# Patient Record
Sex: Female | Born: 1955 | Race: White | Hispanic: No | Marital: Married | Smoking: Current every day smoker
Health system: Southern US, Community
[De-identification: ages and names within clinical notes are randomized; demographics above are authoritative.]

## PROBLEM LIST (undated history)

## (undated) DIAGNOSIS — Z9889 Other specified postprocedural states: Secondary | ICD-10-CM

## (undated) DIAGNOSIS — I10 Essential (primary) hypertension: Secondary | ICD-10-CM

## (undated) DIAGNOSIS — E039 Hypothyroidism, unspecified: Secondary | ICD-10-CM

## (undated) DIAGNOSIS — M545 Low back pain, unspecified: Secondary | ICD-10-CM

## (undated) DIAGNOSIS — F32A Depression, unspecified: Secondary | ICD-10-CM

## (undated) DIAGNOSIS — E041 Nontoxic single thyroid nodule: Secondary | ICD-10-CM

## (undated) DIAGNOSIS — Z720 Tobacco use: Secondary | ICD-10-CM

## (undated) DIAGNOSIS — G8929 Other chronic pain: Secondary | ICD-10-CM

## (undated) DIAGNOSIS — G709 Myoneural disorder, unspecified: Secondary | ICD-10-CM

## (undated) DIAGNOSIS — F40240 Claustrophobia: Secondary | ICD-10-CM

## (undated) DIAGNOSIS — E119 Type 2 diabetes mellitus without complications: Secondary | ICD-10-CM

## (undated) DIAGNOSIS — E785 Hyperlipidemia, unspecified: Secondary | ICD-10-CM

## (undated) DIAGNOSIS — M797 Fibromyalgia: Secondary | ICD-10-CM

## (undated) DIAGNOSIS — F329 Major depressive disorder, single episode, unspecified: Secondary | ICD-10-CM

## (undated) DIAGNOSIS — M1990 Unspecified osteoarthritis, unspecified site: Secondary | ICD-10-CM

## (undated) DIAGNOSIS — R112 Nausea with vomiting, unspecified: Secondary | ICD-10-CM

## (undated) DIAGNOSIS — J449 Chronic obstructive pulmonary disease, unspecified: Secondary | ICD-10-CM

## (undated) DIAGNOSIS — R51 Headache: Secondary | ICD-10-CM

## (undated) DIAGNOSIS — I2510 Atherosclerotic heart disease of native coronary artery without angina pectoris: Secondary | ICD-10-CM

## (undated) DIAGNOSIS — F419 Anxiety disorder, unspecified: Secondary | ICD-10-CM

## (undated) DIAGNOSIS — I219 Acute myocardial infarction, unspecified: Secondary | ICD-10-CM

## (undated) DIAGNOSIS — R011 Cardiac murmur, unspecified: Secondary | ICD-10-CM

## (undated) DIAGNOSIS — R809 Proteinuria, unspecified: Secondary | ICD-10-CM

## (undated) DIAGNOSIS — K219 Gastro-esophageal reflux disease without esophagitis: Secondary | ICD-10-CM

## (undated) DIAGNOSIS — G4730 Sleep apnea, unspecified: Secondary | ICD-10-CM

## (undated) DIAGNOSIS — M4712 Other spondylosis with myelopathy, cervical region: Principal | ICD-10-CM

## (undated) HISTORY — PX: TONSILLECTOMY: SUR1361

## (undated) HISTORY — PX: COLONOSCOPY W/ POLYPECTOMY: SHX1380

## (undated) HISTORY — PX: CARDIAC CATHETERIZATION: SHX172

## (undated) HISTORY — PX: BACK SURGERY: SHX140

---
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
## 1979-06-19 HISTORY — PX: TUBAL LIGATION: SHX77

## 1998-06-18 HISTORY — PX: MULTIPLE TOOTH EXTRACTIONS: SHX2053

## 1998-06-18 HISTORY — PX: ABDOMINAL HYSTERECTOMY: SHX81

## 1998-12-21 ENCOUNTER — Ambulatory Visit (HOSPITAL_COMMUNITY): Admission: RE | Admit: 1998-12-21 | Discharge: 1998-12-21 | Payer: Self-pay | Admitting: *Deleted

## 2000-06-18 HISTORY — PX: CORONARY ANGIOPLASTY WITH STENT PLACEMENT: SHX49

## 2000-07-19 ENCOUNTER — Encounter: Admission: RE | Admit: 2000-07-19 | Discharge: 2000-07-19 | Payer: Self-pay | Admitting: Unknown Physician Specialty

## 2000-07-19 ENCOUNTER — Encounter: Payer: Self-pay | Admitting: Unknown Physician Specialty

## 2001-01-17 ENCOUNTER — Ambulatory Visit (HOSPITAL_COMMUNITY): Admission: RE | Admit: 2001-01-17 | Discharge: 2001-01-17 | Payer: Self-pay | Admitting: Cardiology

## 2001-03-20 ENCOUNTER — Ambulatory Visit (HOSPITAL_COMMUNITY): Admission: RE | Admit: 2001-03-20 | Discharge: 2001-03-21 | Payer: Self-pay | Admitting: Cardiology

## 2001-04-06 ENCOUNTER — Emergency Department (HOSPITAL_COMMUNITY): Admission: EM | Admit: 2001-04-06 | Discharge: 2001-04-06 | Payer: Self-pay

## 2001-06-02 ENCOUNTER — Ambulatory Visit (HOSPITAL_COMMUNITY): Admission: RE | Admit: 2001-06-02 | Discharge: 2001-06-02 | Payer: Self-pay | Admitting: Cardiology

## 2001-09-19 ENCOUNTER — Encounter: Admission: RE | Admit: 2001-09-19 | Discharge: 2001-09-19 | Payer: Self-pay | Admitting: Internal Medicine

## 2001-09-19 ENCOUNTER — Encounter: Payer: Self-pay | Admitting: Internal Medicine

## 2001-11-20 ENCOUNTER — Encounter: Admission: RE | Admit: 2001-11-20 | Discharge: 2001-11-20 | Payer: Self-pay | Admitting: Internal Medicine

## 2001-11-20 ENCOUNTER — Encounter: Payer: Self-pay | Admitting: Internal Medicine

## 2002-04-03 ENCOUNTER — Encounter: Payer: Self-pay | Admitting: Gastroenterology

## 2002-04-03 ENCOUNTER — Ambulatory Visit (HOSPITAL_COMMUNITY): Admission: RE | Admit: 2002-04-03 | Discharge: 2002-04-03 | Payer: Self-pay | Admitting: Gastroenterology

## 2002-04-06 ENCOUNTER — Ambulatory Visit (HOSPITAL_COMMUNITY): Admission: RE | Admit: 2002-04-06 | Discharge: 2002-04-06 | Payer: Self-pay | Admitting: Gastroenterology

## 2002-04-06 ENCOUNTER — Encounter (INDEPENDENT_AMBULATORY_CARE_PROVIDER_SITE_OTHER): Payer: Self-pay

## 2002-04-06 ENCOUNTER — Encounter: Payer: Self-pay | Admitting: Gastroenterology

## 2002-06-15 ENCOUNTER — Ambulatory Visit (HOSPITAL_COMMUNITY): Admission: RE | Admit: 2002-06-15 | Discharge: 2002-06-15 | Payer: Self-pay | Admitting: Gastroenterology

## 2003-08-24 ENCOUNTER — Inpatient Hospital Stay (HOSPITAL_BASED_OUTPATIENT_CLINIC_OR_DEPARTMENT_OTHER): Admission: RE | Admit: 2003-08-24 | Discharge: 2003-08-24 | Payer: Self-pay | Admitting: Cardiology

## 2003-10-12 ENCOUNTER — Emergency Department (HOSPITAL_COMMUNITY): Admission: EM | Admit: 2003-10-12 | Discharge: 2003-10-12 | Payer: Self-pay | Admitting: *Deleted

## 2003-12-31 ENCOUNTER — Encounter: Admission: RE | Admit: 2003-12-31 | Discharge: 2003-12-31 | Payer: Self-pay | Admitting: Internal Medicine

## 2004-05-01 ENCOUNTER — Ambulatory Visit: Payer: Self-pay | Admitting: Cardiology

## 2004-05-05 ENCOUNTER — Ambulatory Visit: Payer: Self-pay | Admitting: Cardiology

## 2004-07-28 ENCOUNTER — Encounter: Admission: RE | Admit: 2004-07-28 | Discharge: 2004-07-28 | Payer: Self-pay | Admitting: Internal Medicine

## 2004-08-25 ENCOUNTER — Ambulatory Visit: Payer: Self-pay | Admitting: Gastroenterology

## 2004-08-31 ENCOUNTER — Ambulatory Visit: Payer: Self-pay | Admitting: Endocrinology

## 2004-09-01 ENCOUNTER — Ambulatory Visit (HOSPITAL_COMMUNITY): Admission: RE | Admit: 2004-09-01 | Discharge: 2004-09-01 | Payer: Self-pay | Admitting: Gastroenterology

## 2004-09-01 ENCOUNTER — Ambulatory Visit: Payer: Self-pay | Admitting: Gastroenterology

## 2004-09-05 ENCOUNTER — Ambulatory Visit: Payer: Self-pay | Admitting: Cardiology

## 2004-09-07 ENCOUNTER — Ambulatory Visit: Payer: Self-pay | Admitting: Endocrinology

## 2004-09-15 ENCOUNTER — Ambulatory Visit: Payer: Self-pay | Admitting: Family Medicine

## 2004-09-21 ENCOUNTER — Ambulatory Visit: Payer: Self-pay | Admitting: Endocrinology

## 2004-09-22 ENCOUNTER — Ambulatory Visit: Payer: Self-pay

## 2004-09-22 ENCOUNTER — Ambulatory Visit: Payer: Self-pay | Admitting: Cardiology

## 2004-09-27 ENCOUNTER — Ambulatory Visit: Payer: Self-pay | Admitting: Family Medicine

## 2004-10-11 ENCOUNTER — Ambulatory Visit: Payer: Self-pay | Admitting: Family Medicine

## 2004-10-19 ENCOUNTER — Ambulatory Visit: Payer: Self-pay | Admitting: Endocrinology

## 2004-10-20 ENCOUNTER — Ambulatory Visit: Payer: Self-pay | Admitting: Family Medicine

## 2004-11-02 ENCOUNTER — Ambulatory Visit: Payer: Self-pay | Admitting: Endocrinology

## 2004-11-08 ENCOUNTER — Ambulatory Visit: Payer: Self-pay | Admitting: Gastroenterology

## 2004-11-16 ENCOUNTER — Ambulatory Visit: Payer: Self-pay | Admitting: Gastroenterology

## 2004-11-21 ENCOUNTER — Ambulatory Visit (HOSPITAL_COMMUNITY): Admission: RE | Admit: 2004-11-21 | Discharge: 2004-11-21 | Payer: Self-pay | Admitting: Gastroenterology

## 2004-11-21 ENCOUNTER — Ambulatory Visit: Payer: Self-pay | Admitting: Gastroenterology

## 2004-11-21 ENCOUNTER — Encounter (INDEPENDENT_AMBULATORY_CARE_PROVIDER_SITE_OTHER): Payer: Self-pay | Admitting: Specialist

## 2004-11-30 ENCOUNTER — Ambulatory Visit: Payer: Self-pay | Admitting: Family Medicine

## 2004-12-07 ENCOUNTER — Ambulatory Visit: Payer: Self-pay | Admitting: Endocrinology

## 2005-01-05 ENCOUNTER — Ambulatory Visit: Payer: Self-pay | Admitting: Endocrinology

## 2005-01-26 ENCOUNTER — Ambulatory Visit: Payer: Self-pay | Admitting: Family Medicine

## 2005-02-02 ENCOUNTER — Ambulatory Visit: Payer: Self-pay | Admitting: Endocrinology

## 2005-03-01 ENCOUNTER — Ambulatory Visit: Payer: Self-pay | Admitting: Endocrinology

## 2005-03-12 ENCOUNTER — Other Ambulatory Visit: Admission: RE | Admit: 2005-03-12 | Discharge: 2005-03-12 | Payer: Self-pay | Admitting: Obstetrics and Gynecology

## 2005-04-03 ENCOUNTER — Ambulatory Visit: Payer: Self-pay | Admitting: Family Medicine

## 2005-04-06 ENCOUNTER — Ambulatory Visit: Payer: Self-pay | Admitting: Cardiology

## 2005-05-04 ENCOUNTER — Ambulatory Visit: Payer: Self-pay | Admitting: Family Medicine

## 2005-05-24 ENCOUNTER — Ambulatory Visit: Payer: Self-pay | Admitting: Gastroenterology

## 2005-06-01 ENCOUNTER — Ambulatory Visit: Payer: Self-pay | Admitting: Family Medicine

## 2005-06-18 HISTORY — PX: BOWEL RESECTION: SHX1257

## 2005-06-25 ENCOUNTER — Ambulatory Visit: Payer: Self-pay | Admitting: Gastroenterology

## 2005-07-06 ENCOUNTER — Encounter: Admission: RE | Admit: 2005-07-06 | Discharge: 2005-07-06 | Payer: Self-pay | Admitting: Orthopedic Surgery

## 2005-08-21 ENCOUNTER — Ambulatory Visit: Payer: Self-pay | Admitting: Family Medicine

## 2005-09-07 ENCOUNTER — Ambulatory Visit (HOSPITAL_COMMUNITY): Admission: RE | Admit: 2005-09-07 | Discharge: 2005-09-07 | Payer: Self-pay | Admitting: Orthopedic Surgery

## 2005-09-19 ENCOUNTER — Encounter: Admission: RE | Admit: 2005-09-19 | Discharge: 2005-09-19 | Payer: Self-pay | Admitting: Orthopedic Surgery

## 2005-11-01 ENCOUNTER — Ambulatory Visit: Payer: Self-pay | Admitting: Family Medicine

## 2005-11-27 ENCOUNTER — Ambulatory Visit: Payer: Self-pay | Admitting: Family Medicine

## 2005-11-30 ENCOUNTER — Ambulatory Visit: Payer: Self-pay | Admitting: Gastroenterology

## 2005-12-11 ENCOUNTER — Ambulatory Visit: Payer: Self-pay | Admitting: Family Medicine

## 2005-12-24 ENCOUNTER — Encounter: Payer: Self-pay | Admitting: Gastroenterology

## 2005-12-24 ENCOUNTER — Ambulatory Visit (HOSPITAL_COMMUNITY): Admission: RE | Admit: 2005-12-24 | Discharge: 2005-12-24 | Payer: Self-pay | Admitting: Gastroenterology

## 2005-12-28 ENCOUNTER — Ambulatory Visit: Payer: Self-pay | Admitting: Gastroenterology

## 2005-12-31 ENCOUNTER — Ambulatory Visit: Payer: Self-pay | Admitting: Cardiology

## 2006-01-02 ENCOUNTER — Emergency Department (HOSPITAL_COMMUNITY): Admission: EM | Admit: 2006-01-02 | Discharge: 2006-01-02 | Payer: Self-pay | Admitting: Emergency Medicine

## 2006-01-02 ENCOUNTER — Ambulatory Visit: Payer: Self-pay | Admitting: Gastroenterology

## 2006-01-16 ENCOUNTER — Ambulatory Visit: Payer: Self-pay | Admitting: Family Medicine

## 2006-01-24 ENCOUNTER — Encounter (INDEPENDENT_AMBULATORY_CARE_PROVIDER_SITE_OTHER): Payer: Self-pay | Admitting: *Deleted

## 2006-01-24 ENCOUNTER — Ambulatory Visit (HOSPITAL_COMMUNITY): Admission: RE | Admit: 2006-01-24 | Discharge: 2006-01-25 | Payer: Self-pay | Admitting: Surgery

## 2006-04-08 ENCOUNTER — Ambulatory Visit: Payer: Self-pay | Admitting: Family Medicine

## 2006-04-08 LAB — CONVERTED CEMR LAB
ALT: 65 units/L — ABNORMAL HIGH (ref 0–40)
AST: 40 units/L — ABNORMAL HIGH (ref 0–37)
Albumin: 4.2 g/dL (ref 3.5–5.2)
Alkaline Phosphatase: 71 units/L (ref 39–117)
BUN: 10 mg/dL (ref 6–23)
CO2: 30 meq/L (ref 19–32)
Calcium: 9.8 mg/dL (ref 8.4–10.5)
Chloride: 103 meq/L (ref 96–112)
Chol/HDL Ratio, serum: 9.5
Cholesterol: 316 mg/dL (ref 0–200)
Creatinine, Ser: 0.6 mg/dL (ref 0.4–1.2)
Free T4: 0.7 ng/dL — ABNORMAL LOW (ref 0.9–1.8)
GFR calc non Af Amer: 113 mL/min
Glomerular Filtration Rate, Af Am: 137 mL/min/{1.73_m2}
Glucose, Bld: 202 mg/dL — ABNORMAL HIGH (ref 70–99)
HCT: 45.1 % (ref 36.0–46.0)
HDL: 33.3 mg/dL — ABNORMAL LOW (ref >39.0)
Hemoglobin: 15.4 g/dL — ABNORMAL HIGH (ref 12.0–15.0)
Hgb A1c MFr Bld: 6.9 % — ABNORMAL HIGH (ref 4.6–6.0)
LDL DIRECT: 227.9 mg/dL
MCHC: 34.1 g/dL (ref 30.0–36.0)
MCV: 99.5 fL (ref 78.0–100.0)
Magnesium: 1.8 mg/dL (ref 1.5–2.5)
Platelets: 181 10*3/uL (ref 150–400)
Potassium: 4.1 meq/L (ref 3.5–5.1)
RBC: 4.54 M/uL (ref 3.87–5.11)
RDW: 11.7 % (ref 11.5–14.6)
Sodium: 142 meq/L (ref 135–145)
T3, Free: 3.2 pg/mL (ref 2.3–4.2)
TSH: 3.66 microintl units/mL (ref 0.35–5.50)
Total Bilirubin: 0.7 mg/dL (ref 0.3–1.2)
Total Protein: 7.4 g/dL (ref 6.0–8.3)
Triglyceride fasting, serum: 242 mg/dL (ref 0–149)
VLDL: 48 mg/dL — ABNORMAL HIGH (ref 0–40)
Vitamin B-12: 601 pg/mL (ref 211–911)
WBC: 7.6 10*3/uL (ref 4.5–10.5)

## 2006-05-03 ENCOUNTER — Ambulatory Visit: Payer: Self-pay | Admitting: Family Medicine

## 2006-06-28 ENCOUNTER — Ambulatory Visit: Payer: Self-pay | Admitting: Family Medicine

## 2006-07-02 ENCOUNTER — Ambulatory Visit: Payer: Self-pay | Admitting: Family Medicine

## 2006-07-11 ENCOUNTER — Ambulatory Visit: Payer: Self-pay | Admitting: Internal Medicine

## 2006-08-03 ENCOUNTER — Encounter: Payer: Self-pay | Admitting: Family Medicine

## 2006-09-27 ENCOUNTER — Ambulatory Visit: Payer: Self-pay | Admitting: Family Medicine

## 2006-09-27 LAB — CONVERTED CEMR LAB
ALT: 83 units/L — ABNORMAL HIGH (ref 0–40)
AST: 52 units/L — ABNORMAL HIGH (ref 0–37)
Albumin: 3.9 g/dL (ref 3.5–5.2)
Alkaline Phosphatase: 90 units/L (ref 39–117)
BUN: 14 mg/dL (ref 6–23)
Bilirubin, Direct: 0.2 mg/dL (ref 0.0–0.3)
CO2: 29 meq/L (ref 19–32)
Calcium: 9.2 mg/dL (ref 8.4–10.5)
Chloride: 101 meq/L (ref 96–112)
Cholesterol: 287 mg/dL (ref 0–200)
Creatinine, Ser: 0.6 mg/dL (ref 0.4–1.2)
Direct LDL: 221.2 mg/dL
GFR calc Af Amer: 137 mL/min
GFR calc non Af Amer: 113 mL/min
Glucose, Bld: 257 mg/dL — ABNORMAL HIGH (ref 70–99)
HDL: 38.6 mg/dL — ABNORMAL LOW (ref >39.0)
Hgb A1c MFr Bld: 9.4 % — ABNORMAL HIGH (ref 4.6–6.0)
Potassium: 4.2 meq/L (ref 3.5–5.1)
Sodium: 139 meq/L (ref 135–145)
Total Bilirubin: 1.1 mg/dL (ref 0.3–1.2)
Total CHOL/HDL Ratio: 7.4
Total Protein: 6.6 g/dL (ref 6.0–8.3)
Triglycerides: 234 mg/dL (ref 0–149)
VLDL: 47 mg/dL — ABNORMAL HIGH (ref 0–40)

## 2006-10-25 ENCOUNTER — Encounter: Payer: Self-pay | Admitting: Family Medicine

## 2006-11-15 ENCOUNTER — Ambulatory Visit: Payer: Self-pay | Admitting: Family Medicine

## 2006-11-21 ENCOUNTER — Ambulatory Visit: Payer: Self-pay | Admitting: Family Medicine

## 2006-12-09 DIAGNOSIS — E119 Type 2 diabetes mellitus without complications: Secondary | ICD-10-CM | POA: Insufficient documentation

## 2006-12-09 DIAGNOSIS — Z8719 Personal history of other diseases of the digestive system: Secondary | ICD-10-CM | POA: Insufficient documentation

## 2006-12-09 DIAGNOSIS — I2510 Atherosclerotic heart disease of native coronary artery without angina pectoris: Secondary | ICD-10-CM | POA: Insufficient documentation

## 2006-12-09 DIAGNOSIS — J309 Allergic rhinitis, unspecified: Secondary | ICD-10-CM | POA: Insufficient documentation

## 2006-12-09 DIAGNOSIS — K219 Gastro-esophageal reflux disease without esophagitis: Secondary | ICD-10-CM | POA: Insufficient documentation

## 2006-12-09 DIAGNOSIS — E782 Mixed hyperlipidemia: Secondary | ICD-10-CM | POA: Insufficient documentation

## 2006-12-09 DIAGNOSIS — I10 Essential (primary) hypertension: Secondary | ICD-10-CM | POA: Insufficient documentation

## 2006-12-09 DIAGNOSIS — J45909 Unspecified asthma, uncomplicated: Secondary | ICD-10-CM | POA: Insufficient documentation

## 2007-01-24 ENCOUNTER — Ambulatory Visit: Payer: Self-pay | Admitting: Family Medicine

## 2007-01-24 DIAGNOSIS — G9009 Other idiopathic peripheral autonomic neuropathy: Secondary | ICD-10-CM | POA: Insufficient documentation

## 2007-01-30 LAB — CONVERTED CEMR LAB
ALT: 76 units/L — ABNORMAL HIGH (ref 0–35)
AST: 40 units/L — ABNORMAL HIGH (ref 0–37)
Albumin: 4.5 g/dL (ref 3.5–5.2)
Alkaline Phosphatase: 83 units/L (ref 39–117)
BUN: 12 mg/dL (ref 6–23)
Bilirubin, Direct: 0.1 mg/dL (ref 0.0–0.3)
CO2: 25 meq/L (ref 19–32)
Calcium: 9.1 mg/dL (ref 8.4–10.5)
Chloride: 102 meq/L (ref 96–112)
Creatinine, Ser: 0.66 mg/dL (ref 0.40–1.20)
Glucose, Bld: 202 mg/dL — ABNORMAL HIGH (ref 70–99)
HCT: 42.5 % (ref 36.0–46.0)
Hemoglobin: 15.1 g/dL — ABNORMAL HIGH (ref 12.0–15.0)
Indirect Bilirubin: 0.6 mg/dL (ref 0.0–0.9)
Lead-Whole Blood: 0.6 ug/dL (ref <10.0)
MCHC: 35.5 g/dL (ref 30.0–36.0)
MCV: 96.8 fL (ref 78.0–100.0)
Magnesium: 1.6 mg/dL (ref 1.5–2.5)
Platelets: 169 10*3/uL (ref 150–400)
Potassium: 3.9 meq/L (ref 3.5–5.3)
Pro B Natriuretic peptide (BNP): 165 pg/mL — ABNORMAL HIGH (ref 0.0–100.0)
RBC: 4.39 M/uL (ref 3.87–5.11)
RDW: 12.9 % (ref 11.5–14.0)
Sed Rate: 15 mm/hr (ref 0–22)
Sodium: 140 meq/L (ref 135–145)
TSH: 2.818 microintl units/mL (ref 0.350–5.50)
Total Bilirubin: 0.7 mg/dL (ref 0.3–1.2)
Total Protein: 7.1 g/dL (ref 6.0–8.3)
Vitamin B-12: 712 pg/mL (ref 211–911)
WBC: 8.5 10*3/uL (ref 4.0–10.5)

## 2007-01-31 ENCOUNTER — Encounter: Payer: Self-pay | Admitting: Family Medicine

## 2007-02-10 ENCOUNTER — Telehealth: Payer: Self-pay | Admitting: Family Medicine

## 2007-02-14 ENCOUNTER — Ambulatory Visit: Payer: Self-pay | Admitting: Family Medicine

## 2007-02-14 DIAGNOSIS — K5909 Other constipation: Secondary | ICD-10-CM | POA: Insufficient documentation

## 2007-02-14 DIAGNOSIS — IMO0001 Reserved for inherently not codable concepts without codable children: Secondary | ICD-10-CM | POA: Insufficient documentation

## 2007-02-18 ENCOUNTER — Telehealth: Payer: Self-pay | Admitting: Family Medicine

## 2007-02-19 DIAGNOSIS — M2550 Pain in unspecified joint: Secondary | ICD-10-CM | POA: Insufficient documentation

## 2007-02-28 ENCOUNTER — Telehealth: Payer: Self-pay | Admitting: *Deleted

## 2007-04-10 DIAGNOSIS — M549 Dorsalgia, unspecified: Secondary | ICD-10-CM | POA: Insufficient documentation

## 2007-04-18 ENCOUNTER — Encounter: Payer: Self-pay | Admitting: Family Medicine

## 2007-04-22 ENCOUNTER — Ambulatory Visit: Payer: Self-pay | Admitting: Family Medicine

## 2007-04-22 DIAGNOSIS — F411 Generalized anxiety disorder: Secondary | ICD-10-CM | POA: Insufficient documentation

## 2007-04-22 DIAGNOSIS — J209 Acute bronchitis, unspecified: Secondary | ICD-10-CM | POA: Insufficient documentation

## 2007-05-01 ENCOUNTER — Ambulatory Visit: Payer: Self-pay | Admitting: Family Medicine

## 2007-05-05 ENCOUNTER — Encounter: Payer: Self-pay | Admitting: Family Medicine

## 2007-05-06 ENCOUNTER — Encounter: Payer: Self-pay | Admitting: Family Medicine

## 2007-05-07 ENCOUNTER — Encounter: Payer: Self-pay | Admitting: Family Medicine

## 2007-05-30 ENCOUNTER — Encounter: Payer: Self-pay | Admitting: Family Medicine

## 2007-06-27 DIAGNOSIS — G5600 Carpal tunnel syndrome, unspecified upper limb: Secondary | ICD-10-CM | POA: Insufficient documentation

## 2007-06-27 DIAGNOSIS — M791 Myalgia, unspecified site: Secondary | ICD-10-CM | POA: Insufficient documentation

## 2007-08-04 ENCOUNTER — Encounter: Payer: Self-pay | Admitting: Family Medicine

## 2007-08-05 DIAGNOSIS — K769 Liver disease, unspecified: Secondary | ICD-10-CM | POA: Insufficient documentation

## 2007-08-05 DIAGNOSIS — K649 Unspecified hemorrhoids: Secondary | ICD-10-CM | POA: Insufficient documentation

## 2007-10-27 ENCOUNTER — Telehealth: Payer: Self-pay | Admitting: Family Medicine

## 2007-12-26 DIAGNOSIS — R52 Pain, unspecified: Secondary | ICD-10-CM | POA: Insufficient documentation

## 2008-04-15 DIAGNOSIS — K5730 Diverticulosis of large intestine without perforation or abscess without bleeding: Secondary | ICD-10-CM | POA: Insufficient documentation

## 2008-04-15 DIAGNOSIS — M5137 Other intervertebral disc degeneration, lumbosacral region: Secondary | ICD-10-CM | POA: Insufficient documentation

## 2008-04-15 DIAGNOSIS — M51379 Other intervertebral disc degeneration, lumbosacral region without mention of lumbar back pain or lower extremity pain: Secondary | ICD-10-CM | POA: Insufficient documentation

## 2008-10-07 DIAGNOSIS — K2270 Barrett's esophagus without dysplasia: Secondary | ICD-10-CM | POA: Insufficient documentation

## 2008-11-30 DIAGNOSIS — I8390 Asymptomatic varicose veins of unspecified lower extremity: Secondary | ICD-10-CM | POA: Insufficient documentation

## 2008-12-21 DIAGNOSIS — I2510 Atherosclerotic heart disease of native coronary artery without angina pectoris: Secondary | ICD-10-CM | POA: Insufficient documentation

## 2009-03-04 ENCOUNTER — Encounter: Payer: Self-pay | Admitting: Family Medicine

## 2009-04-22 DIAGNOSIS — E559 Vitamin D deficiency, unspecified: Secondary | ICD-10-CM | POA: Insufficient documentation

## 2010-05-31 DIAGNOSIS — E042 Nontoxic multinodular goiter: Secondary | ICD-10-CM | POA: Insufficient documentation

## 2010-07-09 ENCOUNTER — Encounter: Payer: Self-pay | Admitting: Orthopedic Surgery

## 2010-07-16 LAB — CONVERTED CEMR LAB
BUN: 9 mg/dL (ref 6–23)
CO2: 30 meq/L (ref 19–32)
Calcium: 9.4 mg/dL (ref 8.4–10.5)
Chloride: 101 meq/L (ref 96–112)
Creatinine, Ser: 0.7 mg/dL (ref 0.4–1.2)
GFR calc Af Amer: 114 mL/min
GFR calc non Af Amer: 95 mL/min
Glucose, Bld: 309 mg/dL — ABNORMAL HIGH (ref 70–99)
Potassium: 4.6 meq/L (ref 3.5–5.1)
Sodium: 139 meq/L (ref 135–145)
Total CK: 156 units/L (ref 7–177)

## 2010-07-20 NOTE — Progress Notes (Signed)
Summary: refill valium  Phone Note From Pharmacy Call back at [phone]   Caller: [facility] [place] Call For: Johnnie Goynes  Summary of Call: refill valium 10mg  1 by mouth two times a day as needed Initial call taken by: Alfred Levins, CMA,  Oct 27, 2007 8:01 AM  Follow-up for Phone Call        call in #60 with 5 rf Follow-up by: Nelwyn Salisbury MD,  Oct 27, 2007 8:34 AM  Additional Follow-up for Phone Call Additional follow up Details #1::        Phone call completed, Pharmacist called Additional Follow-up by: Alfred Levins, CMA,  Oct 27, 2007 8:40 AM      Prescriptions: VALIUM 10 MG  TABS (DIAZEPAM) two times a day as needed  #60 x 5   Entered by:   Alfred Levins, CMA   Authorized by:   Nelwyn Salisbury MD   Signed by:   Alfred Levins, CMA on 10/27/2007   Method used:   Telephoned to ...       Raynald Kemp [address]*       [address]       [place], [place]  [address]       Ph: [phone]       Fax: [phone]   RxID:   [id number]

## 2010-07-20 NOTE — Letter (Signed)
Summary: Dr Boneta Lucks note  Dr Boneta Lucks note   Imported By: Kassie Mends 04/25/2007 10:56:22  _____________________________________________________________________  External Attachment:    Type:   Image     Comment:   Dr Boneta Lucks note

## 2010-07-20 NOTE — Procedures (Signed)
Summary: egd  egd   Imported By: Kassie Mends 08/07/2007 10:14:29  _____________________________________________________________________  External Attachment:    Type:   Image     Comment:   egd

## 2010-07-20 NOTE — Assessment & Plan Note (Signed)
Summary: ?bronchitis/njr  Medications Added ZITHROMAX Z-PAK 250 MG  TABS (AZITHROMYCIN) as directed        Vital Signs:  Patient Profile:   55 Years Old Female Weight:      210 pounds Temp:     98.1 degrees F oral Pulse rate:   62 / minute Pulse rhythm:   regular BP sitting:   124 / 72  (left arm) Cuff size:   large  Vitals Entered By: Alfred Levins, CMA (April 22, 2007 1:11 PM)                 Chief Complaint:  bronchitis?.  History of Present Illness: 3 days of coughing up sputum, PND, and chest congestion. No fever.   Current Allergies: ! PENICILLIN ! PERCOCET ! ACCUPRIL ! * ATACAND ! VANCOMYCIN ! * PLAVIX ! REGLAN ! * CYCLINES ! * ZETIA ! * TRIGLIDE ! * PRIMVIL ! PAXIL ! * ACTOS ! DIOVAN ! * PROPULSID ! * TOPROL ! * PRANDIN ! BIAXIN ! * DILAUDID ! TROVAN     Review of Systems      See HPI   Physical Exam  General:     overweight-appearing.   Head:     Normocephalic and atraumatic without obvious abnormalities. No apparent alopecia or balding. Eyes:     No corneal or conjunctival inflammation noted. EOMI. Perrla. Funduscopic exam benign, without hemorrhages, exudates or papilledema. Vision grossly normal. Ears:     External ear exam shows no significant lesions or deformities.  Otoscopic examination reveals clear canals, tympanic membranes are intact bilaterally without bulging, retraction, inflammation or discharge. Hearing is grossly normal bilaterally. Nose:     External nasal examination shows no deformity or inflammation. Nasal mucosa are pink and moist without lesions or exudates. Mouth:     Oral mucosa and oropharynx without lesions or exudates.  Teeth in good repair. Neck:     No deformities, masses, or tenderness noted. Lungs:     Normal respiratory effort, chest expands symmetrically. Lungs are clear to auscultation, no crackles or wheezes.    Impression & Recommendations:  Problem # 1:  ACUTE BRONCHITIS (ICD-466.0)   Her updated medication list for this problem includes:    Proventil 90 Mcg/act Aers (Albuterol) .[name]... As needed    Zithromax Z-pak 250 Mg Tabs (Azithromycin) .[name]... As directed   Problem # 2:  GENERALIZED ANXIETY DISORDER (ICD-300.02)  Her updated medication list for this problem includes:    Valium 10 Mg Tabs (Diazepam) .[name]..[name] Two times a day as needed   Complete Medication List: 1)  Furosemide 40 Mg Tabs (Furosemide) .... Two times a day 2)  Potassium Chloride Crys Cr 20 Meq Tbcr (Potassium chloride crys cr) .... Two times a day 3)  Ziac 5-6.25 Mg Tabs (Bisoprolol-hydrochlorothiazide) .[name].. 1 by mouth once daily 4)  Multivitamins Tabs (Multiple vitamin) .[name].. 1 by mouth once daily 5)  Fish Oil Oil (Fish oil) .[name].. 1 by mouth once daily 6)  Aciphex 20 Mg Tbec (Rabeprazole sodium) .... Once daily as needed 7)  Aspirin 325 Mg Tabs (Aspirin) .[name].. 1 by mouth once daily 8)  Coq10 100 Mg Caps (Coenzyme q10) .[name].. 1 by mouth two times a day 9)  Novolog Mix 70/30 70-30 % Susp (Insulin aspart prot & aspart) .... 70 units three times a day 10)  Lantus 100 Unit/ml Soln (Insulin glargine) .... 86 units two times a day 11)  Colace 100 Mg Caps (Docusate sodium) .[name].. 1 by mouth two  times a day 12)  Allegra 180 Mg Tabs (Fexofenadine hcl) .[name].. 1 by mouth once daily as needed 13)  Miralax Powd (Polyethylene glycol 3350) .... Once daily 14)  Nitro-dur 0.4 Mg/hr Pt24 (Nitroglycerin) .... As needed 15)  Proventil 90 Mcg/act Aers (Albuterol) .... As needed 16)  Fibertab 625 Mg Tabs (Calcium polycarbophil) .[name].. 1 by mouth once daily 17)  Valium 10 Mg Tabs (Diazepam) .... Two times a day as needed 18)  Amitiza 24 Mcg Caps (Lubiprostone) .... Two times a day 19)  Zithromax Z-pak 250 Mg Tabs (Azithromycin) .... As directed   Patient Instructions: 1)  Please schedule a follow-up appointment as needed.    Prescriptions: VALIUM 10 MG  TABS (DIAZEPAM) two times a day as needed  #60 x 5   Entered and Authorized  by:   Nelwyn Salisbury MD   Signed by:   Nelwyn Salisbury MD on 04/22/2007   Method used:   Print then Give to Patient   RxID:   [id number] ZITHROMAX Z-PAK 250 MG  TABS (AZITHROMYCIN) as directed  #1 x 0   Entered and Authorized by:   Nelwyn Salisbury MD   Signed by:   Nelwyn Salisbury MD on 04/22/2007   Method used:   Print then Give to Patient   RxID:   [id number]  ]

## 2010-07-20 NOTE — Progress Notes (Signed)
Summary: Potassium questions  Phone Note Call from Patient Call back at Home Phone [phone]   Caller: Patient Summary of Call: Still swollen from head to toe taking 80mg  of lasix and is also concerned about her liver function.  Labs were a little elevated Initial call taken by: Alfred Levins, CMA,  February 10, 2007 5:32 PM  Follow-up for Phone Call        try Lasix 80mg  two times a day for a few days and report back Follow-up by: Nelwyn Salisbury MD,  February 11, 2007 8:59 AM  Additional Follow-up for Phone Call Additional follow up Details #1::        Called and gave pt the message.  Pt wanted me to remind Dr Clent Ridges that she is already taking 2 stool softeners and wants Dr Claris Che opinion, is 80 mg Lasix 2 X a day going to effect her bowels in a negative way? Additional Follow-up by: Sid Falcon LPN,  February 11, 2007 5:02 PM    Additional Follow-up for Phone Call Additional follow up Details #2::    probably not, let me know if it does Follow-up by: Nelwyn Salisbury MD,  February 11, 2007 5:16 PM  Additional Follow-up for Phone Call Additional follow up Details #3:: Details for Additional Follow-up Action Taken: [facility]. ..................................................................Marland KitchenSid Falcon LPN  February 12, 2007 9:00 AM Pt is currently talking Potassium 40 mg two times a day, does she need to increase this also?  Will she need to have lab work done after this to check potassium levels ..................................................................Marland KitchenSid Falcon LPN  February 12, 2007 12:33 PM  temporarily increase to 2 pills two times a day , we will check potassium if she needs to do this for more then one month  Pt called back and informed, she will keep Dr Clent Ridges informed of her progress. ..................................................................Marland KitchenSid Falcon LPN  February 12, 2007 4:16 PM   Additional Follow-up by: Nelwyn Salisbury MD,  February 12, 2007 1:39 PM

## 2010-07-20 NOTE — Letter (Signed)
Summary: Dr Artis Flock note  Dr Artis Flock note   Imported By: Kassie Mends 06/09/2007 16:27:18  _____________________________________________________________________  External Attachment:    Type:   Image     Comment:   Dr Artis Flock note

## 2010-07-20 NOTE — Letter (Signed)
Summary: [facility]  [facility]   Imported By: Maryln Gottron 11/21/2009 10:39:13  _____________________________________________________________________  External Attachment:    Type:   Image     Comment:   External Document

## 2010-07-20 NOTE — Assessment & Plan Note (Signed)
Summary: ROA  Medications Added FUROSEMIDE 40 MG TABS (FUROSEMIDE)  FUROSEMIDE 40 MG TABS (FUROSEMIDE) two times a day POTASSIUM CHLORIDE CRYS CR 20 MEQ TBCR (POTASSIUM CHLORIDE CRYS CR) two times a day POTASSIUM CHLORIDE CRYS CR 20 MEQ TBCR (POTASSIUM CHLORIDE CRYS CR)  ZIAC 5-6.25 MG  TABS (BISOPROLOL-HYDROCHLOROTHIAZIDE) 1 by mouth once daily MULTIVITAMINS   TABS (MULTIPLE VITAMIN) 1 by mouth once daily FISH OIL   OIL (FISH OIL) 1 by mouth once daily ACIPHEX 20 MG TBEC (RABEPRAZOLE SODIUM) once daily as needed ACIPHEX 20 MG TBEC (RABEPRAZOLE SODIUM) as needed ASPIRIN 325 MG TABS (ASPIRIN) 1 by mouth once daily COQ10 100 MG  CAPS (COENZYME Q10) 1 by mouth two times a day NOVOLOG MIX 70/30 70-30 %  SUSP (INSULIN ASPART PROT & ASPART) 70 units three times a day LANTUS 100 UNIT/ML  SOLN (INSULIN GLARGINE) 86 units two times a day COLACE 100 MG CAPS (DOCUSATE SODIUM) 1 by mouth two times a day ALLEGRA 180 MG TABS (FEXOFENADINE HCL) 1 by mouth once daily as needed MIRALAX   POWD (POLYETHYLENE GLYCOL 3350) once daily NITRO-DUR 0.4 MG/HR  PT24 (NITROGLYCERIN) as needed PROVENTIL 90 MCG/ACT  AERS (ALBUTEROL) as needed FIBERTAB 625 MG  TABS (CALCIUM POLYCARBOPHIL) 1 by mouth once daily VALIUM 10 MG  TABS (DIAZEPAM) as needed VALIUM 10 MG  TABS (DIAZEPAM) two times a day as needed      Allergies Added: ! PENICILLIN ! PERCOCET ! ACCUPRIL ! * ATACAND ! VANCOMYCIN ! * PLAVIX ! REGLAN ! * CYCLINES ! * ZETIA ! * TRIGLIDE ! * PRIMVIL ! PAXIL ! * ACTOS ! DIOVAN ! * PROPULSID ! * TOPROL ! * PRANDIN ! BIAXIN ! * DILAUDID ! TROVAN  Vital Signs:  Patient Profile:   55 Years Old Female Weight:      213 pounds (96.82 kg) Temp:     98.3 degrees F (36.83 degrees C) oral Pulse rate:   73 / minute Pulse rhythm:   regular BP sitting:   112 / 72  (left arm)  Vitals Entered By: Alfred Levins, CMA (January 24, 2007 3:40 PM)               Chief Complaint:  both leg swelling.   History of Present Illness: Having a lot of swelling lately in hands and legs. No SOB but very tired.   Current Allergies: ! PENICILLIN ! PERCOCET ! ACCUPRIL ! * ATACAND ! VANCOMYCIN ! * PLAVIX ! REGLAN ! * CYCLINES ! * ZETIA ! * TRIGLIDE ! * PRIMVIL ! PAXIL ! * ACTOS ! DIOVAN ! * PROPULSID ! * TOPROL ! * PRANDIN ! BIAXIN ! * DILAUDID ! TROVAN  Past Medical History:    Reviewed history from 12/09/2006 and no changes required:       Allergic rhinitis       Asthma       Coronary artery disease       Diabetes mellitus, type II       Hyperlipidemia       Hypertension       GERD     Review of Systems      See HPI   Physical Exam  General:     overweight-appearing.   Extremities:     2+ left pedal edema and 2+ right pedal edema.      Impression & Recommendations:  Problem # 1:  DIABETES MELLITUS, TYPE II (ICD-250.00) Assessment: Unchanged  Her updated medication list for this problem includes:  Aspirin 325 Mg Tabs (Aspirin) .[name]... 1 by mouth once daily    Novolog Mix 70/30 70-30 % Susp (Insulin aspart prot & aspart) .Marland KitchenMarland KitchenMarland KitchenMarland Kitchen 70 units three times a day    Lantus 100 Unit/ml Soln (Insulin glargine) .[name]... 86 units two times a day   Problem # 2:  HYPERTENSION (ICD-401.9) Assessment: Unchanged  Her updated medication list for this problem includes:    Furosemide 40 Mg Tabs (Furosemide) .[name]..[name] Two times a day    Ziac 5-6.25 Mg Tabs (Bisoprolol-hydrochlorothiazide) .[name]... 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 Laboratory test [phone]) T-BNP  (B Natriuretic Peptide) [phone])   Complete Medication List: 1)  Furosemide 40 Mg Tabs (Furosemide) .... Two times a day 2)   Potassium Chloride Crys Cr 20 Meq Tbcr (Potassium chloride crys cr) .... Two times a day 3)  Ziac 5-6.25 Mg Tabs (Bisoprolol-hydrochlorothiazide) .[name].. 1 by mouth once daily 4)  Multivitamins Tabs (Multiple vitamin) .[name].. 1 by mouth once daily 5)  Fish Oil Oil (Fish oil) .[name].. 1 by mouth once daily 6)  Aciphex 20 Mg Tbec (Rabeprazole sodium) .... Once daily as needed 7)  Aspirin 325 Mg Tabs (Aspirin) .[name].. 1 by mouth once daily 8)  Coq10 100 Mg Caps (Coenzyme q10) .[name].. 1 by mouth two times a day 9)  Novolog Mix 70/30 70-30 % Susp (Insulin aspart prot & aspart) .... 70 units three times a day 10)  Lantus 100 Unit/ml Soln (Insulin glargine) .... 86 units two times a day 11)  Colace 100 Mg Caps (Docusate sodium) .[name].. 1 by mouth two times a day 12)  Allegra 180 Mg Tabs (Fexofenadine hcl) .[name].. 1 by mouth once daily as needed 13)  Miralax Powd (Polyethylene glycol 3350) .... Once daily 14)  Nitro-dur 0.4 Mg/hr Pt24 (Nitroglycerin) .... As needed 15)  Proventil 90 Mcg/act Aers (Albuterol) .... As needed 16)  Fibertab 625 Mg Tabs (Calcium polycarbophil) .[name].. 1 by mouth once daily 17)  Valium 10 Mg Tabs (Diazepam) .... Two times a day as needed   Patient Instructions: 1)  Please schedule a follow-up appointment as needed.    Prescriptions: POTASSIUM CHLORIDE CRYS CR 20 MEQ TBCR (POTASSIUM CHLORIDE CRYS CR) two times a day  #60 x 11   Entered and Authorized by:   Nelwyn Salisbury MD   Signed by:   Nelwyn Salisbury MD on 01/24/2007   Method used:   Print then Give to Patient   RxID:   [id number] FUROSEMIDE 40 MG TABS (FUROSEMIDE) two times a day  #60 x 11   Entered and Authorized by:   Nelwyn Salisbury MD   Signed by:   Nelwyn Salisbury MD on 01/24/2007   Method used:   Print then Give to Patient   RxID:   [id number]        Appended Document: Orders Update    Clinical Lists Changes  Orders: Added new Service order of Venipuncture [id number]) - Signed

## 2010-07-20 NOTE — Letter (Signed)
Summary: Endocrinology/[facility]  Endocrinology/[facility]   Imported By: Sherian Rein 11/26/2009 10:14:05  _____________________________________________________________________  External Attachment:    Type:   Image     Comment:   External Document

## 2010-07-20 NOTE — Letter (Signed)
Summary: Dr Boneta Lucks note  Dr Boneta Lucks note   Imported By: Kassie Mends 06/06/2007 09:53:43  _____________________________________________________________________  External Attachment:    Type:   Image     Comment:   Dr Boneta Lucks note

## 2010-07-20 NOTE — Progress Notes (Signed)
Summary: wants ref  Phone Note Call from Patient Call back at Home Phone [phone]   Caller: Patient Call For: Trejuan Matherne Reason for Call: Refill Medication Summary of Call: wants a ref to rheumatologist thinks she could have arthritis Initial call taken by: Alfred Levins, CMA,  February 18, 2007 4:00 PM  Follow-up for Phone Call        ok, for arthralgias Follow-up by: Nelwyn Salisbury MD,  February 18, 2007 5:37 PM  Additional Follow-up for Phone Call Additional follow up Details #1::        Phone Call Completed Additional Follow-up by: Alfred Levins, CMA,  February 19, 2007 7:36 AM  New Problems: ARTHRALGIA (ICD-719.40)   New Problems: ARTHRALGIA (ICD-719.40)

## 2010-09-05 DIAGNOSIS — E119 Type 2 diabetes mellitus without complications: Secondary | ICD-10-CM | POA: Insufficient documentation

## 2010-09-20 DIAGNOSIS — E039 Hypothyroidism, unspecified: Secondary | ICD-10-CM | POA: Insufficient documentation

## 2010-11-03 NOTE — Cardiovascular Report (Signed)
[facility]  Patient:    Courtney Barker, Courtney Barker Visit Number: [id number] MRN: [id number]          Service Type: CAT Location: 3700 [id number] Attending Physician:  Armanda Magic Admit Date:  03/20/2001   CC:         Armanda Magic, M.D.  Cath Lab  Dr. Ludwig Clarks   Cardiac Catheterization  ADDENDUM TO JOB [phone]  It should be noted that this case was complex and difficult.  It required extensive balloon manipulation and exchanges.  Multiple inflations were required.  Several wire exchanges were necessary in order to cross the complete occlusion.  Three stents were needed to full cover the lesion and provide adequate patency to the distal vessel.  The entire procedure required 2-1/2 hours to complete. Attending Physician:  Armanda Magic DD:  03/20/01 TD:  03/21/01 Job: [id number]

## 2010-11-03 NOTE — Cardiovascular Report (Signed)
Courtney Barker. [facility]  Patient:    Courtney Barker, Courtney Barker Visit Number: [id number] MRN: [id number]          Service Type: CAT Location: 3700 [id number] Attending Physician:  Armanda Magic Proc. Date: 03/20/01 Admit Date:  03/20/2001   CC:         Armanda Magic, M.D.  Ralene Ok, M.D., [facility], [place]  Cath Lab   Cardiac Catheterization  PROCEDURE:  Percutaneous coronary intervention/stent implantation (multiple) mid and proximal RCA.  INDICATIONS:  Courtney Sylvain. Barker is a 55 year old diabetic woman with atypical angina and an abnormal Cardiolite study as well as dyspnea.  Dr. Mayford Knife has completed a Cardiolite that was abnormal and coronary angiography revealing a completely occluded right coronary artery with trivial antegrade flow. No significant left coronary artery disease.  There is left to right collateral filling.  An attempt will be made to revascularize the right coronary percutaneously.  DESCRIPTION OF PROCEDURE:  The previously placed 6 French catheter sheath was exchanged over a long guiding J-wire for 7 French catheter sheath.  The 6 French catheter sheath was inserted percutaneously into the right femoral vein for intravenous access.  A 7 French 3.5 FR guiding catheter was advanced to the ascending aorta where the right coronary os was engaged.  A 0.014 Scimed Choice PT intracoronary wire was manipulated to about midway through the completely occluded right coronary.  A 20 mm x 1.5 mm ACS Guidant opensail balloon was then advanced over the wire into the proximal portion of the right coronary.  The Choice PT was removed and a Choice PT Graphics 0.014 inch intracoronary wire was advanced into and across the complete occlusion in the midportion of the right coronary.  The opensail balloon was advanced and it was inflated in two locations to a maximum pressure of 12 atmospheres in the proximal and midportion of the right coronary.  This balloon was removed  and it was exchanged for a 2.5 x 20 mm Scimed Maverick intracoronary balloon. This was inflated in three positions in the right coronary to a maximum of 12 atmospheres for a maximum of 67 seconds.  This balloon was removed and a 2.75 x 32 mm Scimed Express intracoronary stent was advanced into place in the proximal mid portion of the right coronary.  It was deployed there at a maximum pressure of 12 atmospheres for a maximum duration of 70 seconds.  The stent balloon was removed and an Kurten Monorail Ranger intracoronary balloon advanced into the midportion of the stent and inflated there to 8 atmospheres for 97 seconds.  It was also inflated to 1 atmosphere at the distal portion of the stent.  This was done for 68 seconds.  This balloon was removed and a 3.0 x 8 mm Scimed Express intracoronary stent was placed carefully in the ostium of the right coronary.  It was deployed there at 12 atmospheres for 46 seconds. Finally there appeared to be a distal edge dissection at the end of the previously placed 32 mm Express.  Therefore, a 2.5 x 8 mm Scimed Express was advanced into the mid to distal portion of the right coronary and deployed at the distal end of the 32 mm stent at 8 atmospheres for 42 seconds.  These maneuvers all resulted in wide patency of the proximal and midportion of the right coronary.  The distal vessel was relatively small.  She has codominant coronary circulation.  Excellent antegrade flow was present.  The guiding catheter and guide wire  were removed after confirming adequate patency in orthogonal views.  The catheter sheath was sutured in place and the patient was transported to the recovery room in stable condition with intact distal pulses.  It should be noted that the patient received an initial dose of 4000 units of heparin and her initial ACT was 230 seconds.  Over the course of the procedure, it fell to 173 seconds.  She received an additional 3000 units of heparin  and her final ACT was 289 seconds.  She also received a bolus of ______ and constant infusion during the procedure.  ANGIOGRAPHY:  As mentioned, the lesion treated was a long complete occlusion of the proximal and midportion of the right coronary.  Following extensive balloon dilatation and long stent implantation, there was no residual stenosis.  It should be noted, however, that this length of stent in a relatively small vessel has approximately 60 to 70% restenosis rate.  FINAL IMPRESSION: 1. Atherosclerotic coronary vascular disease, single vessel. 2. Status post successful percutaneous transluminal coronary angioplasty stent    implantation complete occlusion of the right coronary artery. 3. 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  PLAN:  Due to the high restenosis rate and very significant symptoms associated with this complete occlusion, I would favor an aggressive follow-up approach with repeat coronary angiography at approximately three months and subsequently brachytherapy for any evidence of progressive or restenosis. Attending Physician:  Armanda Magic DD:  03/20/01 TD:  03/21/01 Job: [id number]

## 2010-11-03 NOTE — Assessment & Plan Note (Signed)
[facility]                           GASTROENTEROLOGY OFFICE NOTE   NAME:Courtney Barker, Courtney Barker                           MRN:          [id number]  DATE:01/02/2006                            DOB:          11-Dec-1955    PROBLEM:  Rectal pain.   Mrs. Garvey has returned after an ER visit today.  Colonoscopy demonstrated an  anal fissure.  She is scheduled to see Dr. Polo Riley at [facility].  She is complaining of increasing rectal pain with swelling.  She  has also had some right lower quadrant pain.  CT today was unremarkable.  CBC was also normal.   PHYSICAL EXAMINATION:  VITAL SIGNS:  Pulse 56, blood pressure 132/90, weight  202.  ABDOMEN:  Without masses, tenderness, or organomegaly.  RECTAL:  She has external hemorrhoids.  Fissure is partially seen in the  posterior midline.   IMPRESSION:  Symptomatic anal fissure.   RECOMMENDATIONS:  The patient was given 25 tablets of Tylenol No. 3 with 1  renewal and he is instructed to use warm soaks and Diltiazem cream.  She is  to see Dr. Polo Riley for further evaluation.                                   Barbette Hair. Arlyce Dice, MD, [facility]   RDK/MedQ  DD:  01/02/2006  DT:  01/02/2006  Job #:  [id number]

## 2010-11-03 NOTE — Cardiovascular Report (Signed)
[place]. [facility]  Patient:    Courtney Barker, RIDING Visit Number: [id number] MRN: [id number]          Service Type: CAT Location: [facility] 2860 [id number] Attending Physician:  Armanda Magic Dictated by:   Armanda Magic, M.D. Proc. Date: 06/02/01 Admit Date:  06/02/2001   CC:         Ralene Ok, M.D.   Cardiac Catheterization  PROCEDURES PERFORMED: Left heart catheterization, coronary angiography, left ventriculography.  INDICATIONS: Chest pain, coronary artery disease.  COMPLICATIONS: None.  INTERVENOUS ACCESS: Via right femoral artery, 6 French sheath.  HISTORY OF PRESENT ILLNESS: This is a 55 year old, white female with a history of severe diabetes mellitus, also one-vessel coronary disease of the right coronary artery. She is status post PTCA stenting of the mid and proximal RCA. She now presents with recurrent chest and arm pain.  DESCRIPTION OF PROCEDURE: The patient is brought to the cardiac catheterization laboratory in a fasting, nonsedated state. Informed consent was obtained. The patient was connected to continuous heart rate and pulse oximetry monitoring, and intermittent blood pressure monitoring. The right groin was prepped and draped in a sterile fashion. Lidocaine 1% was used for local anesthesia. Using the modified Seldinger technique, a 6 French sheath was placed in the right femoral artery.  Under fluoroscopic guidance, a 6 French Judkins JL4 catheter was placed in the left coronary artery. Multiple cine films were taken in a 30 degree RAO, 40 degree LAO views. This catheter was then exchanged out over a wire for a 6 [place] JR4 which was placed in the right coronary artery. Multiple cine films were taken in a 30 degree RAO, 40 degree LAO views.  This catheter was then exchanged out over a guide wire for a 6 French angled pigtail catheter which was placed in the left ventricular cavity. Left ventriculography was performed in a 30 degree RAO view  using a total of 30 cc of contrast at 13 cc/sec. The catheter was then pulled back across the aortic valve with no significant aortic valve gradient noted. At the end of the procedures, all catheters and sheaths were removed. Manual compression was performed until adequate hemostasis was obtained. The patient was transferred back to the room in stable condition.  RESULTS: 1. The left main coronary artery is widely patent and bifurcates into a    left anterior descending artery and left circumflex artery. 2. The left anterior descending artery is widely patent throughout its    course and gives rise to one large diagonal branch which is widely    patent. 3. The left circumflex gives rise to one small obtuse marginal #1 branch    which is patent. The left circumflex is widely patent throughout its course    and bifurcates distally in the posterior descending artery, which is    widely patent and a second obtuse marginal branch which is widely patent. 4. The right coronary artery is widely patent throughout its entire vessel.    The mid and proximal stent are widely open with no irregularities.  LEFT VENTRICULOGRAPHY: Left ventriculography performed in 30 degree RAO views using a total of 30 cc of contrast at 13 cc/sec. shows LV function. Aortic pressure 134/75 mmHg. Left ventricular pressure 115/42 mmHg.  IMPRESSION: 1. Coronary artery disease, status post percutaneous transluminal coronary    angioplasty and stenting of the right coronary artery two to three    months ago. 2. Recurrent chest pain, noncardiac. 3. Widely patent stent of the right  coronary artery. 4. Normal left ventricular function.  PLAN: Discharge to home later today after bedrest. Followup with Dr. Mayford Knife in 2-3 weeks. Continue current medications. Followup with Dr. ______ for work-up of chest pain and left arm pain. No Glucophage until Wednesday. Dictated by:   Armanda Magic, M.D. Attending Physician:  Armanda Magic DD:  06/02/01 TD:  06/02/01 Job: [id number]

## 2010-11-03 NOTE — Cardiovascular Report (Signed)
[place]. [facility]  Patient:    Courtney Barker, Courtney Barker Visit Number: [id number] MRN: [id number]          Service Type: CAT Location: 3700 [id number] [id number] Attending Physician:  Armanda Magic Dictated by:   Armanda Magic, M.D. Proc. Date: 03/20/01 Admit Date:  03/20/2001   CC:         Ralene Ok, M.D.   Cardiac Catheterization  REFERRING PHYSICIAN:  Ralene Ok, M.D.  CHIEF COMPLAINT:  Chest pain.  PROCEDURES:  Left heart catheterization, coronary angiography, left ventriculography.  OPERATOR:  Armanda Magic, M.D.  INDICATIONS:  Chest pain and dyspnea, abnormal Cardiolite.  COMPLICATIONS:  None.  IV MEDICATIONS:  Versed 2 mg and sublingual nitroglycerin 0.4 mg x 2.  IV ACCESS:  Via right femoral artery, 6-French sheath.  HISTORY:  This is a 55 year old white female with a history of diabetes mellitus, hypertension, who has been been having for the past six months to a year dyspnea on exertion, as well as exertional chest pain which has become worse over time.  She underwent Adenosine Cardiolite study which showed decreased perfusion in the apex on stress imaging, with redistribution on resting images consistent with ischemia.  The patient now presents for cardiac catheterization.  DESCRIPTION OF PROCEDURE:  The patient is brought to the cardiac catheterization laboratory in the fasting nonsedated state.  Informed consent was obtained.  The patient was connected to continuous heart rate and pulse oximetry monitoring and intermittent blood pressure monitoring.  The right groin was prepped and draped in a sterile fashion.  One percent Xylocaine was used for local anesthesia.  Using the modified Seldinger technique, a 6-French sheath was placed in the right femoral artery.  Under fluoroscopic guidance, a 6-French JL-4 catheter was placed in the left coronary artery.  The left main coronary artery was very short and essentially was a dual ostium for the LAD and  left circumflex system.  Multiple cine films were taken in 30 degree RAO and 40 degree LAO views.  This catheter was then exchanged out over a guide wire for a 6-French JR-4 catheter which was placed in the right coronary artery.  Coronary angiography was taken in the 40 degree LAO view.  The catheter dampened upon immediate engagement of the coronary ostium and the patient had chest pain. After angiography was performed, the catheter was removed.  This catheter was then exchanged out for a 6-French angled pigtail catheter, which was placed under fluoroscopic guidance into the left ventricular cavity.  Left ventriculography was performed in a 30 degree RAO view using a total of 30 cc of contrast at 13 cc/sec.  The catheter was then pulled back across the aortic valve with no significant pressure gradient noted.  The catheter was then removed over a guide wire.  Under fluoroscopic guidance, the JR-4 catheter was then placed back into the right coronary artery.  There were 200 mcg of intracoronary nitroglycerin given and the coronary angiography was performed in the 30 degree RAO and 40 degree LAO views.  The catheter was then removed. At the end of the procedure, the patient went on to PCI per Dr. Amil Amen.  RESULTS: 1. The left main coronary artery is very short and essentially is a dual    ostium for the left anterior descending and left circumflex arteries. 2. The left anterior descending artery is widely patent throughout its course    and gives rise to two high diagonal branches which are widely patent. 3. The left circumflex  is widely patent throughout its course and gives rise    to a first obtuse marginal branch and then distally gives rise to a very    large posterior descending artery before traversing the rest of the way    in the AV groove.  There is evidence of left-to-left collateral flow to a    distal right coronary artery. 4. The right coronary artery appears small and  diffusely diseased proximally.    It is then subtotaled in the proximal to mid portion with some    recanalization and then occluded.  Again, there were left-to-right    collaterals seen filling the distal right coronary artery.  It appears    that this is a dual codominant system. 5. Left ventriculography performed in the 30 degree RAO view showed normal    left ventricular systolic function, with no mitral regurgitation.  Left    ventricular pressure 194/23 mmHg.  Left ventricular end-diastolic pressure    37 mmHg.  Aortic pressure 191/100 mmHg.  ASSESSMENT: 1. One-vessel obstructive coronary disease with co-dominant system in    left-to-right collaterals distally. 2. Normal left ventricular function.  PLAN: 1. PCI per Dr. Amil Amen. 2. Continue Norvasc 5 mg a day. 3. Start Imdur 30 mg a day. 4. Add Toprol XL 25 mg a day. 5. Discontinue Ziac. 6. No Glucophage for 48 hours. 7. We will increase Glucotrol XL to 10 mg a day x 48 hours to cover her blood    sugars while she is off of Glucophage. 8. She will need aggressive treatment of her hypertension. Dictated by:   Armanda Magic, M.D. Attending Physician:  Armanda Magic DD:  03/20/01 TD:  03/21/01 Job: [id number]

## 2010-11-03 NOTE — Cardiovascular Report (Signed)
NAME:  Courtney Barker, Courtney Barker                            ACCOUNT NO.:  [id number]   MEDICAL RECORD NO.:  [id number]                   PATIENT TYPE:  OIB   LOCATION:  6501                                 FACILITY:  [facility]   PHYSICIAN:  Learta Codding, M.D. [facility]             DATE OF BIRTH:  September 17, 1955   DATE OF PROCEDURE:  08/24/2003  DATE OF DISCHARGE:  08/24/2003                              CARDIAC CATHETERIZATION   PROCEDURE PERFORMED:  1. Left heart catheterization with selective coronary angiography.  2. Ventriculography.   DIAGNOSES:  1. No significant in-stent restenosis.  2. No other significant flow-limiting coronary artery disease.  3. Normal left ventricular systolic function.   DESCRIPTION OF PROCEDURE:  After informed consent was obtained, the patient  was brought to the catheterization laboratory.  4-French arterial catheters  were used to engage right and left coronary arteries.  No complications were  encountered during the procedure.  At the end of the procedure, all  catheters were removed and adequate hemostasis was provided.   FINDINGS:   HEMODYNAMICS:  Left ventricular pressure 136/8 mmHg, aortic pressure 136/75  mmHg.   VENTRICULOGRAPHY:  Ejection fraction 65%.   SELECTIVE CORONARY ANGIOGRAPHY:  1. Coronary circulation was left dominant.  There were separate ostia for     the LAD and circumflex coronary artery without definite left main     coronary.  2. Left anterior descending artery was a large caliber vessel with no     evidence of flow-limiting disease.  Diagonal branches were free of flow-     limiting disease.  3. Circumflex coronary artery was free of flow-limiting disease.  4. Right coronary artery:  A stent was present in the proximal segment.  The     distal RCA had diffuse 20-30% stenosis.  PDA and posterior lateral     branches were free of flow-limiting disease.   CONCLUSION:  The patient's chest pain is likely noncardiac.  Microvascular  disease  cannot be excluded.  Plan to increase her Norvasc to 5 mg p.o. daily  and plan for discharge later today.                                               Learta Codding, M.D. [facility]    GED/MEDQ  D:  09/27/2003  T:  09/28/2003  Job:  [id number]

## 2010-11-03 NOTE — Op Note (Signed)
NAME:  Courtney Barker, Courtney Barker                  ACCOUNT NO.:  [id number]   MEDICAL RECORD NO.:  [id number]          PATIENT TYPE:  AMB   LOCATION:  DAY                          FACILITY:  [facility]   PHYSICIAN:  Thomas A. Cornett, M.D.DATE OF BIRTH:  [birth year]   DATE OF PROCEDURE:  01/24/2006  DATE OF DISCHARGE:                                 OPERATIVE REPORT   PREOPERATIVE DIAGNOSES:  1. Posterior midline anal fissure.  2. Grade 3 internal hemorrhoids.   POSTOPERATIVE DIAGNOSES:  1. Posterior midline anal fissure.  2. Grade 3 internal hemorrhoids.   PROCEDURE:  1. Lateral internal sphincterotomy.  2. Three-column internal/external hemorrhoidectomy.   SURGEON:  Harriette Bouillon, MD   ANESTHESIA:  General endotracheal anesthesia with 20 mL of 0.25% Sensorcaine  for anal block.   ESTIMATED BLOOD LOSS:  50 mL.   SPECIMEN:  Hemorrhoidal tissue to Pathology.   INDICATIONS FOR PROCEDURE:  The patient is a 55 year old female whose has  had a chronic history of chronic constipation.  She had a posterior anal  fissure and significant internal and external hemorrhoidal disease.  I  recommended surgery for her due to the nonhealing anal fissure and pain she  was having, as well as the very large hemorrhoids, which were quite  symptomatic.  She improved somewhat on medical management, but this was not  going to be a long-term solution for her.  I discussed the procedure as well  as the potential complications with her.  She voiced her understanding  agreed to proceed.   DESCRIPTION OF PROCEDURE:  The patient was brought to the operating room and  initially placed supine.  After induction of general endotracheal  anesthesia, she was placed in the prone jackknife position with her buttocks  taped apart.  After sterile prep and drape, a digital examination was  performed.  She had a small posterior anal fissure.  A proctoscope was then  placed and she had significant internal and external hemorrhoidal  disease on  her left anterior, left posterior and then right anterior and right  posterior regions of her anal canal.  I performed the lateral internal  sphincterotomy first.  I chose her left lateral position for this.  Incision  was made over significant hemorrhoid tissue until I could find the internal  sphincter, which I did.  I then used a hemostat placed underneath this and  Bovie through its to divide it up to the dentate line.  There was  significant overlying hemorrhoidal tissue, so I trimmed this back, and there  is also disease in the patient's left posterior position and I trimmed this  back as well.  This created a mucosal defect, which I closed over the  sphincterotomy, with care taken not to incorporate fibers of the  sphincterotomy.  This was adequate.  She had 2 columns of disease in her  right posterior and right anterior region.  These were excised using a Kelly  clamp and then oversewing the base of the hemorrhoid.  Hemostasis was  excellent.  Irrigation was used and was suctioned out.  A large piece  of  Gelfoam was placed in the anal canal.  A  sterile ABD pad was placed over this.  The patient was then placed supine,  extubated, and taken to Recovery in satisfactory condition.  All final  counts of sponge, needle and instruments were found be correct for this  portion of the case.      Thomas A. Cornett, M.D.  Electronically Signed     TAC/MEDQ  D:  01/24/2006  T:  01/24/2006  Job:  [id number]   cc:   Jeannett Senior A. Clent Ridges, M.D. [facility]  [address] [place]  [place] [address]   Barbette Hair. Arlyce Dice, M.D. [facility]  [address]  [place]  [place] [address]

## 2011-01-10 DIAGNOSIS — F17200 Nicotine dependence, unspecified, uncomplicated: Secondary | ICD-10-CM | POA: Insufficient documentation

## 2011-08-08 ENCOUNTER — Encounter (HOSPITAL_COMMUNITY): Payer: Self-pay | Admitting: Pharmacy Technician

## 2011-08-08 ENCOUNTER — Other Ambulatory Visit: Payer: Self-pay | Admitting: Neurosurgery

## 2011-08-16 ENCOUNTER — Encounter (HOSPITAL_COMMUNITY): Payer: Self-pay

## 2011-08-16 ENCOUNTER — Encounter (HOSPITAL_COMMUNITY)
Admission: RE | Admit: 2011-08-16 | Discharge: 2011-08-16 | Disposition: A | Discharge status: Home / Self Care | Payer: BC Managed Care – PPO | Source: Ambulatory Visit | Attending: Anesthesiology | Admitting: Anesthesiology

## 2011-08-16 ENCOUNTER — Encounter (HOSPITAL_COMMUNITY)
Admission: RE | Admit: 2011-08-16 | Discharge: 2011-08-16 | Disposition: A | Discharge status: Home / Self Care | Payer: BC Managed Care – PPO | Source: Ambulatory Visit | Attending: Neurosurgery | Admitting: Neurosurgery

## 2011-08-16 HISTORY — DX: Anxiety disorder, unspecified: F41.9

## 2011-08-16 HISTORY — DX: Other specified postprocedural states: Z98.890

## 2011-08-16 HISTORY — DX: Unspecified osteoarthritis, unspecified site: M19.90

## 2011-08-16 HISTORY — DX: Fibromyalgia: M79.7

## 2011-08-16 HISTORY — DX: Sleep apnea, unspecified: G47.30

## 2011-08-16 HISTORY — DX: Major depressive disorder, single episode, unspecified: F32.9

## 2011-08-16 HISTORY — DX: Essential (primary) hypertension: I10

## 2011-08-16 HISTORY — DX: Headache: R51

## 2011-08-16 HISTORY — DX: Hypothyroidism, unspecified: E03.9

## 2011-08-16 HISTORY — DX: Gastro-esophageal reflux disease without esophagitis: K21.9

## 2011-08-16 HISTORY — DX: Atherosclerotic heart disease of native coronary artery without angina pectoris: I25.10

## 2011-08-16 HISTORY — DX: Myoneural disorder, unspecified: G70.9

## 2011-08-16 HISTORY — DX: Other specified postprocedural states: R11.2

## 2011-08-16 LAB — BASIC METABOLIC PANEL
BUN: 18 mg/dL (ref 6–23)
CO2: 26 mEq/L (ref 19–32)
Calcium: 10.1 mg/dL (ref 8.4–10.5)
Chloride: 98 mEq/L (ref 96–112)
Creatinine, Ser: 0.78 mg/dL (ref 0.50–1.10)
GFR calc Af Amer: >90 mL/min (ref >90)
GFR calc non Af Amer: >90 mL/min (ref >90)
Glucose, Bld: 214 mg/dL — ABNORMAL HIGH (ref 70–99)
Potassium: 4.2 mEq/L (ref 3.5–5.1)
Sodium: 138 mEq/L (ref 135–145)

## 2011-08-16 LAB — SURGICAL PCR SCREEN
MRSA, PCR: NEGATIVE
Staphylococcus aureus: NEGATIVE

## 2011-08-16 MED ORDER — CIPROFLOXACIN IN D5W 400 MG/200ML IV SOLN: 400.0000 mg | INTRAVENOUS | Status: DC

## 2011-08-16 NOTE — Progress Notes (Signed)
REQUESTED AND RECEIVED EKG AND OV AND OTHER HEART INFO IF AVAILABLE FROM [facility]...  FAX [phone]

## 2011-08-16 NOTE — Progress Notes (Signed)
PER LAB BLOOD SPECIMEN WAS CLOTTED AND PT WILL NEED CBC DOS.

## 2011-08-16 NOTE — Pre-Procedure Instructions (Signed)
20 Courtney Barker  08/16/2011   Your procedure is scheduled on:  08/23/2011  Report to [facility] at 0630 PER OFFICE AM.  Call this number if you have problems the morning of surgery: [phone]   Remember:   Do not eat food:After Midnight.  May have clear liquids: up to 4 Hours before arrival.  Clear liquids include soda, tea, black coffee, apple or grape juice, broth.  Take these medicines the morning of surgery with A SIP OF WATER:    Do not wear jewelry, make-up or nail polish.  Do not wear lotions, powders, or perfumes. You may wear deodorant.  Do not shave 48 hours prior to surgery.  Do not bring valuables to the hospital.  Contacts, dentures or bridgework may not be worn into surgery.  Leave suitcase in the car. After surgery it may be brought to your room.  For patients admitted to the hospital, checkout time is 11:00 AM the day of discharge.   Patients discharged the day of surgery will not be allowed to drive home.  Name and phone number of your driver: Celene Kras [phone]   CELL   Special Instructions: CHG Shower Use Special Wash: 1/2 bottle night before surgery and 1/2 bottle morning of surgery.   Please read over the following fact sheets that you were given: Pain Booklet, Coughing and Deep Breathing, MRSA Information and Surgical Site Infection Prevention

## 2011-08-23 ENCOUNTER — Encounter (HOSPITAL_COMMUNITY): Payer: Self-pay | Admitting: Certified Registered"

## 2011-08-23 ENCOUNTER — Encounter (HOSPITAL_COMMUNITY): Payer: Self-pay | Admitting: *Deleted

## 2011-08-23 ENCOUNTER — Inpatient Hospital Stay (HOSPITAL_COMMUNITY)
Admission: RE | Admit: 2011-08-23 | Discharge: 2011-08-24 | DRG: 864 | Disposition: A | Discharge status: Home / Self Care | Payer: BC Managed Care – PPO | Source: Ambulatory Visit | Attending: Neurosurgery | Admitting: Neurosurgery

## 2011-08-23 ENCOUNTER — Encounter (HOSPITAL_COMMUNITY)
Admission: RE | Disposition: A | Discharge status: Home / Self Care | Payer: Self-pay | Source: Ambulatory Visit | Attending: Neurosurgery

## 2011-08-23 ENCOUNTER — Ambulatory Visit (HOSPITAL_COMMUNITY): Payer: BC Managed Care – PPO

## 2011-08-23 ENCOUNTER — Ambulatory Visit (HOSPITAL_COMMUNITY): Payer: BC Managed Care – PPO | Admitting: Certified Registered"

## 2011-08-23 DIAGNOSIS — E119 Type 2 diabetes mellitus without complications: Secondary | ICD-10-CM | POA: Diagnosis present

## 2011-08-23 DIAGNOSIS — IMO0001 Reserved for inherently not codable concepts without codable children: Secondary | ICD-10-CM | POA: Diagnosis present

## 2011-08-23 DIAGNOSIS — Z01812 Encounter for preprocedural laboratory examination: Secondary | ICD-10-CM

## 2011-08-23 DIAGNOSIS — K219 Gastro-esophageal reflux disease without esophagitis: Secondary | ICD-10-CM | POA: Diagnosis present

## 2011-08-23 DIAGNOSIS — Z79899 Other long term (current) drug therapy: Secondary | ICD-10-CM

## 2011-08-23 DIAGNOSIS — F17200 Nicotine dependence, unspecified, uncomplicated: Secondary | ICD-10-CM | POA: Diagnosis present

## 2011-08-23 DIAGNOSIS — R079 Chest pain, unspecified: Secondary | ICD-10-CM

## 2011-08-23 DIAGNOSIS — Z01818 Encounter for other preprocedural examination: Secondary | ICD-10-CM

## 2011-08-23 DIAGNOSIS — M129 Arthropathy, unspecified: Secondary | ICD-10-CM | POA: Diagnosis present

## 2011-08-23 DIAGNOSIS — E039 Hypothyroidism, unspecified: Secondary | ICD-10-CM | POA: Diagnosis present

## 2011-08-23 DIAGNOSIS — I2510 Atherosclerotic heart disease of native coronary artery without angina pectoris: Secondary | ICD-10-CM | POA: Insufficient documentation

## 2011-08-23 DIAGNOSIS — M4712 Other spondylosis with myelopathy, cervical region: Principal | ICD-10-CM

## 2011-08-23 DIAGNOSIS — Z794 Long term (current) use of insulin: Secondary | ICD-10-CM

## 2011-08-23 DIAGNOSIS — Z7982 Long term (current) use of aspirin: Secondary | ICD-10-CM

## 2011-08-23 DIAGNOSIS — G4730 Sleep apnea, unspecified: Secondary | ICD-10-CM | POA: Diagnosis present

## 2011-08-23 DIAGNOSIS — Z9861 Coronary angioplasty status: Secondary | ICD-10-CM

## 2011-08-23 DIAGNOSIS — J4489 Other specified chronic obstructive pulmonary disease: Secondary | ICD-10-CM | POA: Diagnosis present

## 2011-08-23 DIAGNOSIS — F341 Dysthymic disorder: Secondary | ICD-10-CM | POA: Diagnosis present

## 2011-08-23 DIAGNOSIS — J449 Chronic obstructive pulmonary disease, unspecified: Secondary | ICD-10-CM | POA: Diagnosis present

## 2011-08-23 DIAGNOSIS — E669 Obesity, unspecified: Secondary | ICD-10-CM | POA: Diagnosis present

## 2011-08-23 HISTORY — DX: Tobacco use: Z72.0

## 2011-08-23 HISTORY — DX: Other spondylosis with myelopathy, cervical region: M47.12

## 2011-08-23 HISTORY — DX: Hyperlipidemia, unspecified: E78.5

## 2011-08-23 HISTORY — PX: ANTERIOR CERVICAL DECOMP/DISCECTOMY FUSION: SHX1161

## 2011-08-23 LAB — GLUCOSE, CAPILLARY
Glucose-Capillary: 178 mg/dL — ABNORMAL HIGH (ref 70–99)
Glucose-Capillary: 165 mg/dL — ABNORMAL HIGH (ref 70–99)
Glucose-Capillary: 285 mg/dL — ABNORMAL HIGH (ref 70–99)
Glucose-Capillary: 237 mg/dL — ABNORMAL HIGH (ref 70–99)

## 2011-08-23 SURGERY — ANTERIOR CERVICAL DECOMPRESSION/DISCECTOMY FUSION 2 LEVELS
Anesthesia: General | Site: Neck | Wound class: Clean

## 2011-08-23 MED ORDER — FUROSEMIDE 40 MG PO TABS
40.0000 mg | ORAL_TABLET | Freq: Every day | ORAL | Status: DC
  Filled 2011-08-23 (×2): qty 1

## 2011-08-23 MED ORDER — VANCOMYCIN HCL IN DEXTROSE 1-5 GM/200ML-% IV SOLN
1000.0000 mg | Freq: Once | INTRAVENOUS | Status: AC
  Administered 2011-08-23: 1000 mg via INTRAVENOUS
  Filled 2011-08-23: qty 200

## 2011-08-23 MED ORDER — FLUTICASONE PROPIONATE 50 MCG/ACT NA SUSP
2.0000 | Freq: Every day | NASAL | Status: DC
  Filled 2011-08-23: qty 16

## 2011-08-23 MED ORDER — METFORMIN HCL ER 500 MG PO TB24
1000.0000 mg | ORAL_TABLET | Freq: Two times a day (BID) | ORAL | Status: DC
  Filled 2011-08-23 (×3): qty 2

## 2011-08-23 MED ORDER — VANCOMYCIN HCL IN DEXTROSE 1-5 GM/200ML-% IV SOLN
INTRAVENOUS | Status: AC
  Filled 2011-08-23: qty 200

## 2011-08-23 MED ORDER — INSULIN ASPART 100 UNIT/ML ~~LOC~~ SOLN
0.0000 [IU] | SUBCUTANEOUS | Status: DC
  Administered 2011-08-23: 11 [IU] via SUBCUTANEOUS
  Filled 2011-08-23: qty 3

## 2011-08-23 MED ORDER — LEVOTHYROXINE SODIUM 125 MCG PO TABS
125.0000 ug | ORAL_TABLET | Freq: Every day | ORAL | Status: DC
  Filled 2011-08-23 (×2): qty 1

## 2011-08-23 MED ORDER — VANCOMYCIN HCL 1000 MG IV SOLR
1000.0000 mg | INTRAVENOUS | Status: DC | PRN
  Administered 2011-08-23: 1000 mg via INTRAVENOUS

## 2011-08-23 MED ORDER — INSULIN ASPART 100 UNIT/ML ~~LOC~~ SOLN
80.0000 [IU] | Freq: Three times a day (TID) | SUBCUTANEOUS | Status: DC
  Filled 2011-08-23: qty 3

## 2011-08-23 MED ORDER — BISOPROLOL-HYDROCHLOROTHIAZIDE 5-6.25 MG PO TABS: 3.0000 | ORAL_TABLET | Freq: Every day | ORAL | Status: DC

## 2011-08-23 MED ORDER — BACITRACIN 50000 UNITS IM SOLR
INTRAMUSCULAR | Status: AC
  Filled 2011-08-23: qty 1

## 2011-08-23 MED ORDER — ONDANSETRON HCL 4 MG/2ML IJ SOLN
4.0000 mg | Freq: Once | INTRAMUSCULAR | Status: AC
  Administered 2011-08-23: 4 mg via INTRAVENOUS

## 2011-08-23 MED ORDER — THROMBIN 5000 UNITS EX KIT
PACK | CUTANEOUS | Status: DC | PRN
  Administered 2011-08-23 (×4): 5000 [IU] via TOPICAL

## 2011-08-23 MED ORDER — FENTANYL CITRATE 0.05 MG/ML IJ SOLN
25.0000 ug | INTRAMUSCULAR | Status: DC | PRN
  Administered 2011-08-23 (×2): 25 ug via INTRAVENOUS

## 2011-08-23 MED ORDER — SIMETHICONE 80 MG PO CHEW
80.0000 mg | CHEWABLE_TABLET | Freq: Two times a day (BID) | ORAL | Status: DC | PRN
  Filled 2011-08-23: qty 1

## 2011-08-23 MED ORDER — ONDANSETRON HCL 4 MG/2ML IJ SOLN
INTRAMUSCULAR | Status: AC
  Filled 2011-08-23: qty 2

## 2011-08-23 MED ORDER — DROPERIDOL 2.5 MG/ML IJ SOLN
INTRAMUSCULAR | Status: AC
  Filled 2011-08-23: qty 2

## 2011-08-23 MED ORDER — CYCLOBENZAPRINE HCL 10 MG PO TABS: 10.0000 mg | ORAL_TABLET | Freq: Three times a day (TID) | ORAL | Status: DC | PRN

## 2011-08-23 MED ORDER — FENTANYL CITRATE 0.05 MG/ML IJ SOLN
INTRAMUSCULAR | Status: AC
  Filled 2011-08-23: qty 2

## 2011-08-23 MED ORDER — PANTOPRAZOLE SODIUM 40 MG PO TBEC
80.0000 mg | DELAYED_RELEASE_TABLET | Freq: Every day | ORAL | Status: DC
  Filled 2011-08-23: qty 2

## 2011-08-23 MED ORDER — INSULIN ASPART 100 UNIT/ML ~~LOC~~ SOLN
0.0000 [IU] | Freq: Three times a day (TID) | SUBCUTANEOUS | Status: DC
  Administered 2011-08-24: 2 [IU] via SUBCUTANEOUS
  Administered 2011-08-24: 5 [IU] via SUBCUTANEOUS
  Administered 2011-08-24: 3 [IU] via SUBCUTANEOUS

## 2011-08-23 MED ORDER — SODIUM CHLORIDE 0.9 % IR SOLN
Status: DC | PRN
  Administered 2011-08-23: 11:00:00

## 2011-08-23 MED ORDER — MORPHINE SULFATE 4 MG/ML IJ SOLN
1.0000 mg | INTRAMUSCULAR | Status: DC | PRN
  Administered 2011-08-23: 2 mg via INTRAVENOUS
  Administered 2011-08-24: 4 mg via INTRAVENOUS
  Filled 2011-08-23 (×2): qty 1

## 2011-08-23 MED ORDER — MENTHOL 3 MG MT LOZG: 1.0000 | LOZENGE | OROMUCOSAL | Status: DC | PRN

## 2011-08-23 MED ORDER — ALBUTEROL SULFATE HFA 108 (90 BASE) MCG/ACT IN AERS
2.0000 | INHALATION_SPRAY | Freq: Four times a day (QID) | RESPIRATORY_TRACT | Status: DC
  Filled 2011-08-23: qty 6.7

## 2011-08-23 MED ORDER — DOCUSATE SODIUM 100 MG PO CAPS: 100.0000 mg | ORAL_CAPSULE | Freq: Two times a day (BID) | ORAL | Status: DC

## 2011-08-23 MED ORDER — MIDAZOLAM HCL 5 MG/5ML IJ SOLN
INTRAMUSCULAR | Status: DC | PRN
  Administered 2011-08-23: 2 mg via INTRAVENOUS

## 2011-08-23 MED ORDER — INSULIN LISPRO 100 UNIT/ML ~~LOC~~ SOLN: 40.0000 [IU] | Freq: Every day | SUBCUTANEOUS | Status: DC | PRN

## 2011-08-23 MED ORDER — EZETIMIBE 10 MG PO TABS
10.0000 mg | ORAL_TABLET | Freq: Every day | ORAL | Status: DC
  Filled 2011-08-23 (×2): qty 1

## 2011-08-23 MED ORDER — PHENOL 1.4 % MT LIQD: 1.0000 | OROMUCOSAL | Status: DC | PRN

## 2011-08-23 MED ORDER — ACETAMINOPHEN 650 MG RE SUPP: 650.0000 mg | RECTAL | Status: DC | PRN

## 2011-08-23 MED ORDER — BACITRACIN ZINC 500 UNIT/GM EX OINT
TOPICAL_OINTMENT | CUTANEOUS | Status: DC | PRN
  Administered 2011-08-23: 1 via TOPICAL

## 2011-08-23 MED ORDER — ALBUTEROL SULFATE HFA 108 (90 BASE) MCG/ACT IN AERS: 2.0000 | INHALATION_SPRAY | Freq: Four times a day (QID) | RESPIRATORY_TRACT | Status: DC | PRN

## 2011-08-23 MED ORDER — BUPIVACAINE-EPINEPHRINE PF 0.5-1:200000 % IJ SOLN
INTRAMUSCULAR | Status: DC | PRN
  Administered 2011-08-23: 10 mL

## 2011-08-23 MED ORDER — INSULIN ASPART 100 UNIT/ML ~~LOC~~ SOLN: 40.0000 [IU] | Freq: Two times a day (BID) | SUBCUTANEOUS | Status: DC | PRN

## 2011-08-23 MED ORDER — INSULIN ASPART 100 UNIT/ML ~~LOC~~ SOLN
SUBCUTANEOUS | Status: DC | PRN
  Administered 2011-08-23: 10 [IU] via SUBCUTANEOUS

## 2011-08-23 MED ORDER — DIAZEPAM 5 MG PO TABS
5.0000 mg | ORAL_TABLET | Freq: Four times a day (QID) | ORAL | Status: DC | PRN
  Administered 2011-08-23 – 2011-08-24 (×3): 5 mg via ORAL
  Filled 2011-08-23 (×3): qty 1

## 2011-08-23 MED ORDER — SIMVASTATIN 5 MG PO TABS
5.0000 mg | ORAL_TABLET | Freq: Every day | ORAL | Status: DC
  Filled 2011-08-23 (×2): qty 1

## 2011-08-23 MED ORDER — ADULT MULTIVITAMIN W/MINERALS CH
1.0000 | ORAL_TABLET | Freq: Every day | ORAL | Status: DC
  Filled 2011-08-23 (×2): qty 1

## 2011-08-23 MED ORDER — LACTATED RINGERS IV SOLN
INTRAVENOUS | Status: DC | PRN
  Administered 2011-08-23 (×2): via INTRAVENOUS

## 2011-08-23 MED ORDER — ACETAMINOPHEN 325 MG PO TABS: 650.0000 mg | ORAL_TABLET | ORAL | Status: DC | PRN

## 2011-08-23 MED ORDER — TRAMADOL HCL 50 MG PO TABS
50.0000 mg | ORAL_TABLET | Freq: Four times a day (QID) | ORAL | Status: DC | PRN
  Administered 2011-08-23 – 2011-08-24 (×2): 50 mg via ORAL
  Filled 2011-08-23 (×2): qty 1

## 2011-08-23 MED ORDER — DOCUSATE SODIUM 100 MG PO CAPS
100.0000 mg | ORAL_CAPSULE | Freq: Two times a day (BID) | ORAL | Status: DC
  Administered 2011-08-23: 100 mg via ORAL
  Filled 2011-08-23 (×2): qty 1

## 2011-08-23 MED ORDER — HYDROMORPHONE HCL 2 MG PO TABS: 4.0000 mg | ORAL_TABLET | ORAL | Status: DC | PRN

## 2011-08-23 MED ORDER — HYDROCODONE-ACETAMINOPHEN 5-325 MG PO TABS: 1.0000 | ORAL_TABLET | ORAL | Status: DC | PRN

## 2011-08-23 MED ORDER — PROMETHAZINE-DM 6.25-15 MG/5ML PO SYRP
10.0000 mL | ORAL_SOLUTION | Freq: Three times a day (TID) | ORAL | Status: DC | PRN
  Filled 2011-08-23: qty 10

## 2011-08-23 MED ORDER — PROPOFOL 10 MG/ML IV EMUL
INTRAVENOUS | Status: DC | PRN
  Administered 2011-08-23: 130 mg via INTRAVENOUS

## 2011-08-23 MED ORDER — EPHEDRINE SULFATE 50 MG/ML IJ SOLN
INTRAMUSCULAR | Status: DC | PRN
  Administered 2011-08-23: 10 mg via INTRAVENOUS

## 2011-08-23 MED ORDER — ONDANSETRON HCL 4 MG/2ML IJ SOLN
4.0000 mg | INTRAMUSCULAR | Status: DC | PRN
  Administered 2011-08-24 (×2): 4 mg via INTRAVENOUS
  Filled 2011-08-23 (×2): qty 2

## 2011-08-23 MED ORDER — POTASSIUM CHLORIDE CRYS ER 20 MEQ PO TBCR
20.0000 meq | EXTENDED_RELEASE_TABLET | Freq: Every day | ORAL | Status: DC
  Administered 2011-08-23: 20 meq via ORAL
  Filled 2011-08-23 (×2): qty 1

## 2011-08-23 MED ORDER — LACTATED RINGERS IV SOLN
INTRAVENOUS | Status: DC
  Administered 2011-08-23: 20:00:00 via INTRAVENOUS

## 2011-08-23 MED ORDER — DEXTROSE 5 % IV SOLN
INTRAVENOUS | Status: DC | PRN
  Administered 2011-08-23: 10:00:00 via INTRAVENOUS

## 2011-08-23 MED ORDER — SODIUM CHLORIDE 0.9 % IV SOLN
INTRAVENOUS | Status: AC
  Filled 2011-08-23: qty 500

## 2011-08-23 MED ORDER — SUFENTANIL CITRATE 50 MCG/ML IV SOLN
INTRAVENOUS | Status: DC | PRN
  Administered 2011-08-23: 10 ug via INTRAVENOUS
  Administered 2011-08-23 (×2): 20 ug via INTRAVENOUS

## 2011-08-23 MED ORDER — INSULIN DETEMIR 100 UNIT/ML ~~LOC~~ SOLN
65.0000 [IU] | Freq: Two times a day (BID) | SUBCUTANEOUS | Status: DC
  Administered 2011-08-23 – 2011-08-24 (×3): 65 [IU] via SUBCUTANEOUS
  Filled 2011-08-23: qty 3

## 2011-08-23 MED ORDER — 0.9 % SODIUM CHLORIDE (POUR BTL) OPTIME
TOPICAL | Status: DC | PRN
  Administered 2011-08-23: 1000 mL

## 2011-08-23 MED ORDER — INSULIN ASPART 100 UNIT/ML ~~LOC~~ SOLN
0.0000 [IU] | Freq: Every day | SUBCUTANEOUS | Status: DC
  Administered 2011-08-23: 2 [IU] via SUBCUTANEOUS

## 2011-08-23 MED ORDER — HYDROCHLOROTHIAZIDE 10 MG/ML ORAL SUSPENSION
18.7500 mg | Freq: Every day | ORAL | Status: DC
  Filled 2011-08-23 (×2): qty 2.5

## 2011-08-23 MED ORDER — BISOPROLOL FUMARATE 5 MG PO TABS
15.0000 mg | ORAL_TABLET | Freq: Every day | ORAL | Status: DC
  Filled 2011-08-23 (×2): qty 1

## 2011-08-23 MED ORDER — POLYETHYLENE GLYCOL 3350 17 G PO PACK
17.0000 g | PACK | Freq: Every day | ORAL | Status: DC | PRN
  Filled 2011-08-23: qty 1

## 2011-08-23 MED ORDER — HEMOSTATIC AGENTS (NO CHARGE) OPTIME
TOPICAL | Status: DC | PRN
  Administered 2011-08-23 (×2): 1 via TOPICAL

## 2011-08-23 MED ORDER — INSULIN ASPART 100 UNIT/ML ~~LOC~~ SOLN
SUBCUTANEOUS | Status: AC
  Filled 2011-08-23: qty 1

## 2011-08-23 MED ORDER — ONDANSETRON HCL 4 MG/2ML IJ SOLN
INTRAMUSCULAR | Status: DC | PRN
  Administered 2011-08-23: 4 mg via INTRAVENOUS

## 2011-08-23 MED ORDER — ROCURONIUM BROMIDE 100 MG/10ML IV SOLN
INTRAVENOUS | Status: DC | PRN
  Administered 2011-08-23: 50 mg via INTRAVENOUS

## 2011-08-23 MED ORDER — GUAIFENESIN ER 600 MG PO TB12
600.0000 mg | ORAL_TABLET | Freq: Two times a day (BID) | ORAL | Status: DC | PRN
  Filled 2011-08-23: qty 1

## 2011-08-23 MED ORDER — DROPERIDOL 2.5 MG/ML IJ SOLN
0.6250 mg | Freq: Once | INTRAMUSCULAR | Status: AC
  Administered 2011-08-23: 0.625 mg via INTRAVENOUS

## 2011-08-23 SURGICAL SUPPLY — 70 items
BAG DECANTER FOR FLEXI CONT (MISCELLANEOUS) ×2 IMPLANT
BENZOIN TINCTURE PRP APPL 2/3 (GAUZE/BANDAGES/DRESSINGS) ×2 IMPLANT
BIT DRILL SPINE QC 12 (BIT) ×2 IMPLANT
BLADE SURG 15 STRL LF DISP TIS (BLADE) ×1 IMPLANT
BLADE SURG 15 STRL SS (BLADE) ×1
BLADE ULTRA TIP 2M (BLADE) ×2 IMPLANT
BRUSH SCRUB EZ PLAIN DRY (MISCELLANEOUS) ×2 IMPLANT
BUR BARREL STRAIGHT FLUTE 4.0 (BURR) ×4 IMPLANT
BUR MATCHSTICK NEURO 3.0 LAGG (BURR) ×2 IMPLANT
CANISTER SUCTION 2500CC (MISCELLANEOUS) ×2 IMPLANT
CLIP TI MEDIUM 6 (CLIP) ×2 IMPLANT
CLOTH BEACON ORANGE TIMEOUT ST (SAFETY) ×2 IMPLANT
CLSR STERI-STRIP ANTIMIC 1/2X4 (GAUZE/BANDAGES/DRESSINGS) ×2 IMPLANT
CONT SPEC 4OZ CLIKSEAL STRL BL (MISCELLANEOUS) ×2 IMPLANT
COVER MAYO STAND STRL (DRAPES) ×2 IMPLANT
DEVICE FUSION VIST S 14X14X6MM (Trauma) ×2 IMPLANT
DRAPE LAPAROTOMY 100X72 PEDS (DRAPES) ×2 IMPLANT
DRAPE MICROSCOPE LEICA (MISCELLANEOUS) IMPLANT
DRAPE POUCH INSTRU U-SHP 10X18 (DRAPES) ×2 IMPLANT
DRAPE SURG 17X23 STRL (DRAPES) ×4 IMPLANT
ELECT REM PT RETURN 9FT ADLT (ELECTROSURGICAL) ×2
ELECTRODE REM PT RTRN 9FT ADLT (ELECTROSURGICAL) ×1 IMPLANT
GAUZE SPONGE 4X4 16PLY XRAY LF (GAUZE/BANDAGES/DRESSINGS) ×2 IMPLANT
GLOVE BIO SURGEON STRL SZ8 (GLOVE) ×2 IMPLANT
GLOVE BIO SURGEON STRL SZ8.5 (GLOVE) ×2 IMPLANT
GLOVE BIOGEL PI IND STRL 6.5 (GLOVE) ×2 IMPLANT
GLOVE BIOGEL PI IND STRL 7.0 (GLOVE) ×1 IMPLANT
GLOVE BIOGEL PI IND STRL 7.5 (GLOVE) ×1 IMPLANT
GLOVE BIOGEL PI IND STRL 8.5 (GLOVE) ×1 IMPLANT
GLOVE BIOGEL PI INDICATOR 6.5 (GLOVE) ×2
GLOVE BIOGEL PI INDICATOR 7.0 (GLOVE) ×1
GLOVE BIOGEL PI INDICATOR 7.5 (GLOVE) ×1
GLOVE BIOGEL PI INDICATOR 8.5 (GLOVE) ×1
GLOVE ECLIPSE 7.5 STRL STRAW (GLOVE) ×2 IMPLANT
GLOVE EXAM NITRILE LRG STRL (GLOVE) IMPLANT
GLOVE EXAM NITRILE MD LF STRL (GLOVE) IMPLANT
GLOVE EXAM NITRILE XL STR (GLOVE) IMPLANT
GLOVE EXAM NITRILE XS STR PU (GLOVE) IMPLANT
GLOVE SS BIOGEL STRL SZ 8 (GLOVE) ×1 IMPLANT
GLOVE SUPERSENSE BIOGEL SZ 8 (GLOVE) ×1
GLOVE SURG SS PI 7.0 STRL IVOR (GLOVE) ×2 IMPLANT
GOWN BRE IMP SLV AUR LG STRL (GOWN DISPOSABLE) ×2 IMPLANT
GOWN BRE IMP SLV AUR XL STRL (GOWN DISPOSABLE) ×4 IMPLANT
KIT BASIN OR (CUSTOM PROCEDURE TRAY) ×2 IMPLANT
KIT ROOM TURNOVER OR (KITS) ×2 IMPLANT
MARKER SKIN DUAL TIP RULER LAB (MISCELLANEOUS) ×2 IMPLANT
NEEDLE HYPO 22GX1.5 SAFETY (NEEDLE) ×2 IMPLANT
NEEDLE SPNL 18GX3.5 QUINCKE PK (NEEDLE) ×2 IMPLANT
NS IRRIG 1000ML POUR BTL (IV SOLUTION) ×2 IMPLANT
PACK LAMINECTOMY NEURO (CUSTOM PROCEDURE TRAY) ×2 IMPLANT
PATTIES SURGICAL .5 X.5 (GAUZE/BANDAGES/DRESSINGS) ×2 IMPLANT
PATTIES SURGICAL 1X1 (DISPOSABLE) ×2 IMPLANT
PIN DISTRACTION 14MM (PIN) ×4 IMPLANT
PLATE ANT CERV XTEND 2 LV 28 (Plate) ×2 IMPLANT
PUTTY 5ML ACTIFUSE ABX (Putty) ×2 IMPLANT
RUBBERBAND STERILE (MISCELLANEOUS) IMPLANT
SCREW XTD VAR 4.2 SELF TAP 12 (Screw) ×12 IMPLANT
SPONGE GAUZE 4X4 12PLY (GAUZE/BANDAGES/DRESSINGS) ×2 IMPLANT
SPONGE INTESTINAL PEANUT (DISPOSABLE) ×4 IMPLANT
SPONGE SURGIFOAM ABS GEL SZ50 (HEMOSTASIS) ×4 IMPLANT
STRIP CLOSURE SKIN 1/2X4 (GAUZE/BANDAGES/DRESSINGS) ×2 IMPLANT
SUT VIC AB 0 CT1 27 (SUTURE) ×1
SUT VIC AB 0 CT1 27XBRD ANTBC (SUTURE) ×1 IMPLANT
SUT VIC AB 3-0 SH 8-18 (SUTURE) ×4 IMPLANT
SYR 20ML ECCENTRIC (SYRINGE) ×2 IMPLANT
TAPE CLOTH SURG 4X10 WHT LF (GAUZE/BANDAGES/DRESSINGS) ×2 IMPLANT
TOWEL OR 17X24 6PK STRL BLUE (TOWEL DISPOSABLE) ×2 IMPLANT
TOWEL OR 17X26 10 PK STRL BLUE (TOWEL DISPOSABLE) ×2 IMPLANT
VISTA S O 14X14X6MM (Trauma) ×4 IMPLANT
WATER STERILE IRR 1000ML POUR (IV SOLUTION) ×2 IMPLANT

## 2011-08-23 NOTE — Anesthesia Preprocedure Evaluation (Addendum)
Anesthesia Evaluation  Patient identified by MRN, date of birth, ID band Patient awake    History of Anesthesia Complications (+) PONV  Airway Mallampati: II TM Distance: >3 FB Neck ROM: Full  Mouth opening: Limited Mouth Opening  Dental  (+) Edentulous Upper and Edentulous Lower   Pulmonary shortness of breath, with exertion, at rest and lying, asthma , sleep apnea ,  breath sounds clear to auscultation        Cardiovascular hypertension, Pt. on medications + angina with exertion + CAD Rhythm:Regular Rate:Normal     Neuro/Psych Anxiety Depression    GI/Hepatic GERD-  Medicated and Controlled,  Endo/Other  Diabetes mellitus-, Well Controlled, Type 2, Insulin Dependent and Oral Hypoglycemic AgentsHypothyroidism   Renal/GU      Musculoskeletal  (+) Fibromyalgia -  Abdominal   Peds  Hematology   Anesthesia Other Findings   Reproductive/Obstetrics                          Anesthesia Physical Anesthesia Plan  ASA: III  Anesthesia Plan: General   Post-op Pain Management:    Induction: Intravenous  Airway Management Planned: Oral ETT  Additional Equipment:   Intra-op Plan:   Post-operative Plan: Extubation in OR  Informed Consent: I have reviewed the patients History and Physical, chart, labs and discussed the procedure including the risks, benefits and alternatives for the proposed anesthesia with the patient or authorized representative who has indicated his/her understanding and acceptance.   Dental advisory given  Plan Discussed with: CRNA and Anesthesiologist  Anesthesia Plan Comments:         Anesthesia Quick Evaluation

## 2011-08-23 NOTE — H&P (Signed)
Subjective: The patient is a 56 year old white female who complains of neck and arm pain consistent with a cervical radiculopathy. She has failed medical management was worked up with cervical MRI which demonstrated spondylosis and stenosis at C5-6 and C6-7. I discussed the various treatment options with the patient including surgery. She has weighed the risks, benefits, and alternatives surgery decided proceed with a 2 level anterior cervicectomy fusion and plating.   Past Medical History  Diagnosis Date  . PONV (postoperative nausea and vomiting)   . Coronary artery disease   . Hypertension   . Angina     OFF AND ON ... USES NTG  . Asthma   . Shortness of breath     WITH EXERTION  . Diabetes mellitus     INSULIN AND METFORMIN  . Sleep apnea     NO STUDY DONE .[name].   . Hypothyroidism   . GERD (gastroesophageal reflux disease)   . Neuromuscular disorder   . Headache   . Arthritis   . Depression   . Fibromyalgia     PT STATES "NOT SURE"  . Anxiety     CLAUSTROPHOBIA    Past Surgical History  Procedure Date  . Coronary angioplasty     THREE STENTS AT [facility]  . Tonsillectomy   . Colon surgery     POLUPS REMOVED DURING COLONOSCOPY  . Colon surgery 2007     RESECTION OF BOWEL    . Tubal ligation 1981  . Abdominal hysterectomy 2000    Allergies  Allergen Reactions  . Candesartan Cilexetil Other (See Comments)    Reaction unknown  . Clarithromycin Other (See Comments)    Reaction unknown  . Clopidogrel Bisulfate Other (See Comments)    Reaction unknown  . Ezetimibe Other (See Comments)    Reaction unknown  . Hydromorphone Hcl Other (See Comments)    Reaction unknown  . Metoclopramide Hcl Other (See Comments)    Reaction unknown  . Oxycodone-Acetaminophen Other (See Comments)    Reaction unknown  . Paroxetine Other (See Comments)    Reaction unknown  . Penicillins Other (See Comments)    Reaction unknown  . Pioglitazone Other (See Comments)    Reaction unknown   . Quinapril Hcl Other (See Comments)    Reaction unknown  . Repaglinide Other (See Comments)    Reaction unknown  . Valsartan Other (See Comments)    Reaction unknown  . Vancomycin Other (See Comments)    Reaction unknown    History  Substance Use Topics  . Smoking status: Current Everyday Smoker -- 1.5 packs/day    Types: Cigarettes  . Smokeless tobacco: Not on file  . Alcohol Use: No    Family History  Problem Relation Age of Onset  . Anesthesia problems Mother   . Anesthesia problems Sister    Prior to Admission medications   Medication Sig Start Date End Date Taking? Authorizing Provider  aspirin 325 MG tablet Take 325 mg by mouth daily.   Yes Historical Provider, MD  bisoprolol-hydrochlorothiazide (ZIAC) 5-6.25 MG per tablet Take 3 tablets by mouth daily.   Yes Historical Provider, MD  docusate sodium (COLACE) 100 MG capsule Take 100 mg by mouth 2 (two) times daily as needed. For constipation   Yes Historical Provider, MD  ezetimibe (ZETIA) 10 MG tablet Take 10 mg by mouth daily.   Yes Historical Provider, MD  fish oil-omega-3 fatty acids 1000 MG capsule Take 1,200 mg by mouth daily. 2 tablets daily   Yes Historical  Provider, MD  fluticasone (FLONASE) 50 MCG/ACT nasal spray Place 2 sprays into the nose daily.   Yes Historical Provider, MD  furosemide (LASIX) 40 MG tablet Take 40 mg by mouth daily.   Yes Historical Provider, MD  guaiFENesin (MUCINEX) 600 MG 12 hr tablet Take 600 mg by mouth 2 (two) times daily as needed. For cough/congestion   Yes Historical Provider, MD  ibuprofen (ADVIL,MOTRIN) 200 MG tablet Take 400 mg by mouth every 6 (six) hours as needed. For pain   Yes Historical Provider, MD  insulin detemir (LEVEMIR) 100 UNIT/ML injection Inject 65 Units into the skin 2 (two) times daily.   Yes Historical Provider, MD  insulin lispro (HUMALOG) 100 UNIT/ML injection Inject 40-90 Units into the skin 5 (five) times daily as needed. Use 40-60 units with snacks, and 80-90  units with meals   Yes Historical Provider, MD  levothyroxine (SYNTHROID, LEVOTHROID) 125 MCG tablet Take 125 mcg by mouth daily.   Yes Historical Provider, MD  metFORMIN (GLUCOPHAGE-XR) 500 MG 24 hr tablet Take 1,000 mg by mouth 2 (two) times daily.   Yes Historical Provider, MD  Multiple Vitamin (MULITIVITAMIN WITH MINERALS) TABS Take 1 tablet by mouth daily.   Yes Historical Provider, MD  omeprazole (PRILOSEC) 40 MG capsule Take 40 mg by mouth daily.   Yes Historical Provider, MD  pirbuterol (MAXAIR) 200 MCG/INH inhaler Inhale 2 puffs into the lungs 2 (two) times daily as needed. For shortness of breath   Yes Historical Provider, MD  potassium chloride SA (K-DUR,KLOR-CON) 20 MEQ tablet Take 20 mEq by mouth daily.   Yes Historical Provider, MD  pravastatin (PRAVACHOL) 10 MG tablet Take 10 mg by mouth daily.   Yes Historical Provider, MD  promethazine-dextromethorphan (PROMETHAZINE-DM) 6.25-15 MG/5ML syrup Take 10 mLs by mouth 3 (three) times daily as needed. For cough   Yes Historical Provider, MD  simethicone (MYLICON) 80 MG chewable tablet Chew 80 mg by mouth 2 (two) times daily as needed. For gas   Yes Historical Provider, MD  Vitamin D, Ergocalciferol, (DRISDOL) 50000 UNITS CAPS Take 50,000 Units by mouth every 7 (seven) days.   Yes Historical Provider, MD  COENZYME Q-10 PO Take 1 tablet by mouth daily.    Historical Provider, MD  polyethylene glycol (MIRALAX / GLYCOLAX) packet Take 17 g by mouth daily as needed. For constipation    Historical Provider, MD     Review of Systems  Positive ROS: As above and as documented in my office note  All other systems have been reviewed and were otherwise negative with the exception of those mentioned in the HPI and as above.  Objective: Vital signs in last 24 hours: Temp:  [98.4 F (36.9 C)] 98.4 F (36.9 C) (03/07 0705) Pulse Rate:  [52] 52  (03/07 0705) Resp:  [18] 18  (03/07 0705) BP: (144)/(76) 144/76 mmHg (03/07 0705) SpO2:  [97 %] 97 %  (03/07 0705)  General Appearance: Alert, cooperative, no distress, appears stated age Head: Normocephalic, without obvious abnormality, atraumatic Eyes: PERRL, conjunctiva/corneas clear, EOM's intact, fundi benign, both eyes      Ears: Normal TM's and external ear canals, both ears Throat: Lips, mucosa, and tongue normal; teeth and gums normal Neck: Supple, symmetrical, trachea midline, no adenopathy; thyroid: No enlargement/tenderness/nodules; no carotid bruit or JVD Back: Symmetric, no curvature, ROM normal, no CVA tenderness Lungs: Clear to auscultation bilaterally, respirations unlabored Heart: Regular rate and rhythm, S1 and S2 normal, no murmur, rub or gallop Abdomen: Soft, non-tender, bowel  sounds active all four quadrants, no masses, no organomegaly Extremities: Extremities normal, atraumatic, no cyanosis or edema Pulses: 2+ and symmetric all extremities Skin: Skin color, texture, turgor normal, no rashes or lesions  NEUROLOGIC:   Mental status: alert and oriented, no aphasia, good attention span, Fund of knowledge/ memory ok Motor Exam - grossly normal Sensory Exam - grossly normal Reflexes:  Coordination - grossly normal Gait - grossly normal Balance - grossly normal Cranial Nerves: I: smell Not tested  II: visual acuity  OS: Normal    OD: Normal   II: visual fields Full to confrontation  II: pupils Equal, round, reactive to light  III,VII: ptosis None  III,IV,VI: extraocular muscles  Full ROM  V: mastication Normal  V: facial light touch sensation  Normal  V,VII: corneal reflex  Present  VII: facial muscle function - upper  Normal  VII: facial muscle function - lower Normal  VIII: hearing Not tested  IX: soft palate elevation  Normal  IX,X: gag reflex Present  XI: trapezius strength  5/5  XI: sternocleidomastoid strength 5/5  XI: neck flexion strength  5/5  XII: tongue strength  Normal    Data Review Lab Results  Component Value Date   WBC 8.5 01/24/2007    HGB 15.1* 01/24/2007   HCT 42.5 01/24/2007   MCV 96.8 01/24/2007   PLT 169 01/24/2007   Lab Results  Component Value Date   NA 138 08/16/2011   K 4.2 08/16/2011   CL 98 08/16/2011   CO2 26 08/16/2011   BUN 18 08/16/2011   CREATININE 0.78 08/16/2011   GLUCOSE 214* 08/16/2011   No results found for this basename: INR, PROTIME    Assessment/Plan: C5-6 and C6-7 spondylosis, stenosis, cervical discopathy/myelopathy, cervicalgia: I discussed situation with the patient. I reviewed her MR scan with her and pointed out the abnormalities. We have discussed the various treatment options including a C5-6 and C7 anterior cervicectomy fusion and plating. I described the surgery were. I've shown her surgical models. We have discussed the risks, benefits, alternatives and likelihood of achieving our goals with surgery. I have answered all questions. She has decided proceed with the operation.   Lemarcus Baggerly D 08/23/2011 9:51 AM

## 2011-08-23 NOTE — Transfer of Care (Signed)
Immediate Anesthesia Transfer of Care Note  Patient: Courtney Barker  Procedure(s) Performed: Procedure(s) (LRB): ANTERIOR CERVICAL DECOMPRESSION/DISCECTOMY FUSION 2 LEVELS (N/A)  Patient Location: PACU  Anesthesia Type: General  Level of Consciousness: awake  Airway & Oxygen Therapy: Patient Spontanous Breathing and Patient connected to nasal cannula oxygen  Post-op Assessment: Report given to PACU RN, Post -op Vital signs reviewed and stable and Patient moving all extremities  Post vital signs: Reviewed and stable  Complications: No apparent anesthesia complications

## 2011-08-23 NOTE — Preoperative (Signed)
Beta Blockers   Reason not to administer Beta Blockers:Not Applicable 

## 2011-08-23 NOTE — Progress Notes (Signed)
Subjective:  The patient is alert and pleasant.  Objective: Vital signs in last 24 hours: Temp:  [97 F (36.1 C)-98.4 F (36.9 C)] 97.8 F (36.6 C) (03/07 1430) Pulse Rate:  [52] 52  (03/07 0705) Resp:  [18] 18  (03/07 0705) BP: (144)/(76) 144/76 mmHg (03/07 0705) SpO2:  [97 %] 97 % (03/07 0705)  Intake/Output from previous day:   Intake/Output this shift: Total I/O In: 1400 [I.V.:1400] Out: 150 [Blood:150]  Physical exam the patient is alert and pleasant. She is moving all 4 extremities well. Her dressing is clean and dry. There is no evidence of hematoma or shift.  Lab Results: No results found for this basename: WBC:2,HGB:2,HCT:2,PLT:2 in the last 72 hours BMET No results found for this basename: NA:2,K:2,CL:2,CO2:2,GLUCOSE:2,BUN:2,CREATININE:2,CALCIUM:2 in the last 72 hours  Studies/Results: Dg Cervical Spine 2-3 Views  08/23/2011  *RADIOLOGY REPORT*  Clinical Data: C5-7 ACDF.  CERVICAL SPINE - 2-3 VIEW  Comparison: None.  Findings: We are provided with two views of the cervical spine in the lateral projection.  On the first image, a probe localizes C4- 5.  On the second image, anterior plate and screws are in place from C5-C7.  IMPRESSION: C5-7 ACDF.  Original Report Authenticated By: Bernadene Bell. Maricela Curet, M.D.    Assessment/Plan: The patient is doing well.  LOS: 0 days     Evadne Ose D 08/23/2011, 2:52 PM

## 2011-08-23 NOTE — Anesthesia Postprocedure Evaluation (Signed)
  Anesthesia Post-op Note  Patient: Courtney Barker  Procedure(s) Performed: Procedure(s) (LRB): ANTERIOR CERVICAL DECOMPRESSION/DISCECTOMY FUSION 2 LEVELS (N/A)  Patient Location: PACU  Anesthesia Type: General  Level of Consciousness: awake, alert  and oriented  Airway and Oxygen Therapy: Patient Spontanous Breathing and Patient connected to nasal cannula oxygen  Post-op Pain: mild  Post-op Assessment: Post-op Vital signs reviewed and Patient's Cardiovascular Status Stable  Post-op Vital Signs: stable  Complications: No apparent anesthesia complications

## 2011-08-23 NOTE — Op Note (Signed)
Brief history: The patient is a 56 year old white female who suffered from neck and arm pain consistent with a cervical radiculopathy. She has failed medical management and was worked up with a cervical MRI. This demonstrated significant spondylosis and stenosis at C5-6 and C6-7. I discussed the various treatments including surgery. The patient has weighed the risks, benefits, and alternatives surgery decided proceed with C5-6 and C6-7 anterior cervicectomy fusion and plating.  Preoperative diagnosis: C5-6 and C6-7 spondylosis, stenosis, cervical radiculopathy/myelopathy, cervicalgia  Postoperative diagnosis: The same  Procedure: C5-6 and C6-7 Anterior cervical discectomy/decompression; C5-6 and C6-7 interbody arthrodesis with local morcellized autograft bone and Actifuse bone graft extender; insertion of interbody prosthesis at C5-6 and C6-7 (Zimmer peek interbody prosthesis); anterior cervical plating from C5-6 and C6-7 with globus titanium plate  Surgeon: Dr. Delma Officer  Asst.: Dr. Maeola Harman  Anesthesia: Gen. endotracheal  Estimated blood loss: 100 cc  Drains: None  Complications: None  Description of procedure: The patient was brought to the operating room by the anesthesia team. General endotracheal anesthesia was induced. A roll was placed under the patient's shoulders to keep the neck in the neutral position. The patient's anterior cervical region was then prepared with Betadine scrub and Betadine solution. Sterile drapes were applied.  The area to be incised was then injected with Marcaine with epinephrine solution. I then used a scalpel to make a transverse incision in the patient's left anterior neck. I used the Metzenbaum scissors to divide the platysmal muscle and then to dissect medial to the sternocleidomastoid muscle, jugular vein, and carotid artery. I carefully dissected down towards the anterior cervical spine identifying the esophagus and retracting it medially. Then  using Kitner swabs to clear soft tissue from the anterior cervical spine. We then inserted a bent spinal needle into the upper exposed intervertebral disc space. We then obtained intraoperative radiographs confirm our location.  I then used electrocautery to detach the medial border of the longus colli muscle bilaterally from the C5-6 and C6-7 intervertebral disc spaces. I then inserted the Caspar self-retaining retractor underneath the longus colli muscle bilaterally to provide exposure.  We then incised the intervertebral disc at C6-7. We then performed a partial intervertebral discectomy with a pituitary forceps and the Karlin curettes. I then inserted distraction screws into the vertebral bodies at C6-7. We then distracted the interspace. We then used the high-speed drill to decorticate the vertebral endplates at C6-7, to drill away the remainder of the intervertebral disc, to drill away some posterior spondylosis, and to thin out the posterior longitudinal ligament. I then incised ligament with the arachnoid knife. We then removed the ligament with a Kerrison punches undercutting the vertebral endplates and decompressing the thecal sac. We then performed foraminotomies about the bilateral C7 nerve roots. This completed the decompression at this level.  We then repeated this procedure in an analogous fashion at C5-6 decompressing the C5-6 thecal sac and the bilateral  C6 nerve roots.  We now turned our to attention to the interbody fusion. We used the trial spacers to determine the appropriate size for the interbody prosthesis. We then pre-filled prosthesis with a combination of local morcellized autograft bone that we obtained during decompression as well as Actifuse bone graft extender. We then inserted the prosthesis into the distracted interspace at C5-6 and C6-7. We then removed the distraction screws. There was a good snug fit of the prosthesis in the interspace.   Having completed the fusion we  now turned attention to the anterior spinal instrumentation.  We used the high-speed drill to drill away some anterior spondylosis at the disc spaces so that the plate lay down flat. We selected the appropriate length titanium anterior cervical plate. We laid it along the anterior aspect of the vertebral bodies from C5-C7. We then drilled 12 mm holes at C5, C6, and C7. We then secured the plate to the vertebral bodies by placing two 12 mm self-tapping screws at C5, C6 and C7.[name] We then obtained intraoperative radiograph. The demonstrating good position of the instrumentation. We therefore secured the screws the plate the locking each cam. This completed the instrumentation.  We then obtained hemostasis using bipolar electrocautery. We irrigated the wound out with bacitracin solution. We then removed the retractor. We inspected the esophagus for any damage. There was none apparent. We then reapproximated patient's platysmal muscle with interrupted 3-0 Vicryl suture. We then reapproximated the subcutaneous tissue with interrupted 3-0 Vicryl suture. The skin was reapproximated with Steri-Strips and benzoin. The wound was then covered with bacitracin ointment. A sterile dressing was applied. The drapes were removed. Patient was subsequently extubated by the anesthesia team and transported to the post anesthesia care unit in stable condition. All sponge instrument and needle counts were correct at the end of this case.

## 2011-08-23 NOTE — Progress Notes (Signed)
Vanc post op:  Vanc x1 postop. No drain   Plan:  Vanc 1g IV x1

## 2011-08-24 ENCOUNTER — Encounter (HOSPITAL_COMMUNITY): Payer: Self-pay | Admitting: Cardiology

## 2011-08-24 DIAGNOSIS — R079 Chest pain, unspecified: Secondary | ICD-10-CM

## 2011-08-24 DIAGNOSIS — I2510 Atherosclerotic heart disease of native coronary artery without angina pectoris: Secondary | ICD-10-CM | POA: Insufficient documentation

## 2011-08-24 DIAGNOSIS — M4712 Other spondylosis with myelopathy, cervical region: Principal | ICD-10-CM

## 2011-08-24 LAB — CARDIAC PANEL(CRET KIN+CKTOT+MB+TROPI)
CK, MB: 5.2 ng/mL — ABNORMAL HIGH (ref 0.3–4.0)
Relative Index: 1.8 (ref 0.0–2.5)
Total CK: 293 U/L — ABNORMAL HIGH (ref 7–177)
Troponin I: <0.3 ng/mL (ref <0.30)

## 2011-08-24 LAB — GLUCOSE, CAPILLARY
Glucose-Capillary: 175 mg/dL — ABNORMAL HIGH (ref 70–99)
Glucose-Capillary: 141 mg/dL — ABNORMAL HIGH (ref 70–99)
Glucose-Capillary: 206 mg/dL — ABNORMAL HIGH (ref 70–99)
Glucose-Capillary: 191 mg/dL — ABNORMAL HIGH (ref 70–99)

## 2011-08-24 MED ORDER — ALUM & MAG HYDROXIDE-SIMETH 200-200-20 MG/5ML PO SUSP
30.0000 mL | Freq: Four times a day (QID) | ORAL | Status: DC | PRN
  Administered 2011-08-24: 30 mL via ORAL
  Filled 2011-08-24: qty 30

## 2011-08-24 MED ORDER — DIAZEPAM 5 MG PO TABS: 5.0000 mg | ORAL_TABLET | Freq: Four times a day (QID) | ORAL | Status: AC | PRN

## 2011-08-24 MED ORDER — TRAMADOL HCL 50 MG PO TABS: 50.0000 mg | ORAL_TABLET | Freq: Four times a day (QID) | ORAL | Status: AC | PRN

## 2011-08-24 NOTE — Progress Notes (Signed)
Orthopedic Tech Progress Note Patient Details:  Courtney Barker 02-19-1956 [id number]  Patient ID: Courtney Barker, female   DOB: 07-Feb-1956, 56 y.o.   MRN: [id number] Patient has Aspen collar, per staff.  Leo Grosser T 08/24/2011, 12:14 PM

## 2011-08-24 NOTE — Progress Notes (Signed)
Inpatient Diabetes Program Recommendations  AACE/ADA: New Consensus Statement on Inpatient Glycemic Control (2009)  Target Ranges:  Prepandial:   less than 140 mg/dL      Peak postprandial:   less than 180 mg/dL (1-2 hours)      Critically ill patients:  140 - 180 mg/dL   Results for Courtney Barker, Courtney Barker (MRN [id number]) as of 08/24/2011 13:02  Ref. Range 08/24/2011 08:25 08/24/2011 12:34  Glucose-Capillary Latest Range: 70-99 mg/dL 147 (H) 829 (H)    Home diabetes regimen includes: Levemir 65 units bid Humalog 80-90 units tid with meals Metformin 1000 mg bid  Inpatient Diabetes Program Recommendations Insulin - Meal Coverage: Please consider adding meal coverage- Novolog 6 units tid with meals.  Note: Will follow. Ambrose Finland RN, MSN, CDE Diabetes Coordinator Inpatient Diabetes Program [phone]

## 2011-08-24 NOTE — Discharge Summary (Signed)
Physician Discharge Summary  Patient ID: Courtney Barker MRN: [id number] DOB/AGE: 20-Jan-1956 56 y.o.  Admit date: 08/23/2011 Discharge date: 08/24/2011  Admission Diagnoses:cervical spondylosis  Discharge Diagnoses: same Principal Problem:  *Cervical spondylosis with myelopathy Active Problems:  Coronary artery disease  Chest pain   Discharged Condition: good  Hospital Course: unremarkable except non cardiac chest pain.  Consults:Cardiology Significant Diagnostic Studies:EKG Treatments:C5/6 and C6/7 anterior discectomy, fusion, plating Discharge Exam: Blood pressure 161/73, pulse 55, temperature 97.6 F (36.4 C), temperature source Oral, resp. rate 18, height 5' (1.524 m), weight 89.529 kg (197 lb 6 oz), SpO2 94.00%. Normal strength. No hematoma  Disposition: Home  Discharge Orders    Future Orders Please Complete By Expires   Diet - low sodium heart healthy      Increase activity slowly      Discharge instructions      Comments:   Call [phone] for a follow up appointment.   Remove dressing in 48 hours      Call MD for:  temperature >100.4      Call MD for:  persistant nausea and vomiting      Call MD for:  severe uncontrolled pain      Call MD for:  redness, tenderness, or signs of infection (pain, swelling, redness, odor or green/yellow discharge around incision site)      Call MD for:  difficulty breathing, headache or visual disturbances      Call MD for:  hives      Call MD for:  persistant dizziness or light-headedness      Call MD for:  extreme fatigue        Medication List  As of 08/24/2011  5:01 PM   STOP taking these medications         aspirin 325 MG tablet      ibuprofen 200 MG tablet         TAKE these medications         bisoprolol-hydrochlorothiazide 5-6.25 MG per tablet   Commonly known as: ZIAC   Take 3 tablets by mouth daily.      COENZYME Q-10 PO   Take 1 tablet by mouth daily.      diazepam 5 MG tablet   Commonly known as: VALIUM     Take 1 tablet (5 mg total) by mouth every 6 (six) hours as needed.      docusate sodium 100 MG capsule   Commonly known as: COLACE   Take 100 mg by mouth 2 (two) times daily as needed. For constipation      ezetimibe 10 MG tablet   Commonly known as: ZETIA   Take 10 mg by mouth daily.      fish oil-omega-3 fatty acids 1000 MG capsule   Take 1,200 mg by mouth daily. 2 tablets daily      fluticasone 50 MCG/ACT nasal spray   Commonly known as: FLONASE   Place 2 sprays into the nose daily.      furosemide 40 MG tablet   Commonly known as: LASIX   Take 40 mg by mouth daily.      guaiFENesin 600 MG 12 hr tablet   Commonly known as: MUCINEX   Take 600 mg by mouth 2 (two) times daily as needed. For cough/congestion      insulin detemir 100 UNIT/ML injection   Commonly known as: LEVEMIR   Inject 65 Units into the skin 2 (two) times daily.      insulin lispro 100  UNIT/ML injection   Commonly known as: HUMALOG   Inject 40-90 Units into the skin 5 (five) times daily as needed. Use 40-60 units with snacks, and 80-90 units with meals      levothyroxine 125 MCG tablet   Commonly known as: SYNTHROID, LEVOTHROID   Take 125 mcg by mouth daily.      metFORMIN 500 MG 24 hr tablet   Commonly known as: GLUCOPHAGE-XR   Take 1,000 mg by mouth 2 (two) times daily.      mulitivitamin with minerals Tabs   Take 1 tablet by mouth daily.      omeprazole 40 MG capsule   Commonly known as: PRILOSEC   Take 40 mg by mouth daily.      pirbuterol 200 MCG/INH inhaler   Commonly known as: MAXAIR   Inhale 2 puffs into the lungs 2 (two) times daily as needed. For shortness of breath      polyethylene glycol packet   Commonly known as: MIRALAX / GLYCOLAX   Take 17 g by mouth daily as needed. For constipation      potassium chloride SA 20 MEQ tablet   Commonly known as: K-DUR,KLOR-CON   Take 20 mEq by mouth daily.      pravastatin 10 MG tablet   Commonly known as: PRAVACHOL   Take 10 mg by  mouth daily.      promethazine-dextromethorphan 6.25-15 MG/5ML syrup   Commonly known as: PROMETHAZINE-DM   Take 10 mLs by mouth 3 (three) times daily as needed. For cough      simethicone 80 MG chewable tablet   Commonly known as: MYLICON   Chew 80 mg by mouth 2 (two) times daily as needed. For gas      traMADol 50 MG tablet   Commonly known as: ULTRAM   Take 1 tablet (50 mg total) by mouth every 6 (six) hours as needed.      Vitamin D (Ergocalciferol) 50000 UNITS Caps   Commonly known as: DRISDOL   Take 50,000 Units by mouth every 7 (seven) days.             SignedTressie Stalker D 08/24/2011, 5:01 PM

## 2011-08-24 NOTE — Consult Note (Signed)
CARDIOLOGY CONSULT NOTE  Patient ID: Courtney Barker, MRN: [id number], DOB/AGE: 1955-10-07 56 y.o. Admit date: 08/23/2011 Date of Consult: 08/24/2011  Primary Physician: Nelwyn Salisbury, MD Primary Cardiologist: Dr. Boneta Lucks in [facility]  Chief Complaint: s/p anterior cervicectomy fusion & plating in the setting of cervical radiculopathy Reason for Consultation: post op chest pain and ?EKG changes  HPI: 56 y.o. female w/ PMHx significant for CAD s/p BMS x3 RCA '02, HTN, HLD, DMII, Tobacco abuse, obesity and COPD who presented to [facility] on 08/23/2011 for planned anterior cervicectomy fusion and plating in the setting of cervical radiculopathy.  She has a history of CAD with stenting x 3 to the RCA in '02, repeat cath in '02 and '05 were without significant ISR and normal LV function, and she reports having a nuclear myoview one year ago that was unremarkable. She has significant HLD with intolerance to statins and is going to be seen at a lipid clinic after this hospitalization. She reports some occasional chest discomfort and sob in the past year for which she goes to her cardiologist "to get an EKG done" but has been told they were normal. She is not very active in part due to limitations from neck pain.  She presented to [facility] yesterday for planned cervical surgery which was performed without complication. Post-op she has complained of chest pain that she called "gas". It is located from her neck to the epigastric area and "feels like someone is tugging on something or hitting her in the chest". She rates it a 4/10, without radiation or clear associated symptoms, improves with a deep breath or passing gas and worse with palpation and movement. EKG shows sinus bradycardia 52 bpm, LAFB, unchanged from old EKG. CKMB 5.2, CK 293, and normal troponin.   Past Medical History  Diagnosis Date  . PONV (postoperative nausea and vomiting)   . Coronary artery disease   . Hypertension   .  Angina     OFF AND ON ... USES NTG  . Asthma   . Shortness of breath     WITH EXERTION  . Diabetes mellitus     INSULIN AND METFORMIN  . Sleep apnea     NO STUDY DONE .[name].   . Hypothyroidism   . GERD (gastroesophageal reflux disease)   . Neuromuscular disorder   . Headache   . Arthritis   . Depression   . Fibromyalgia     PT STATES "NOT SURE"  . Anxiety     CLAUSTROPHOBIA  . Hyperlipidemia     statin intolerant, to be followed by lipid clinic      Surgical History:  Past Surgical History  Procedure Date  . Coronary angioplasty     s/p BMS x3 RCA '02  . Tonsillectomy   . Colon surgery     POLUPS REMOVED DURING COLONOSCOPY  . Colon surgery 2007     RESECTION OF BOWEL    . Tubal ligation 1981  . Abdominal hysterectomy 2000  . 2 level anterior cervicectomy fusion and plating 08/23/11     Home Meds: Medication Sig  aspirin 325 MG tablet Take 325 mg by mouth daily.  bisoprolol-hydrochlorothiazide (ZIAC) 5-6.25 MG per tablet Take 3 tablets by mouth daily.  docusate sodium (COLACE) 100 MG capsule Take 100 mg by mouth 2 (two) times daily as needed. For constipation  ezetimibe (ZETIA) 10 MG tablet Take 10 mg by mouth daily.  fish oil-omega-3 fatty acids 1000 MG capsule Take 1,200 mg by  mouth daily. 2 tablets daily  fluticasone (FLONASE) 50 MCG/ACT nasal spray Place 2 sprays into the nose daily.  furosemide (LASIX) 40 MG tablet Take 40 mg by mouth daily.  guaiFENesin (MUCINEX) 600 MG 12 hr tablet Take 600 mg by mouth 2 (two) times daily as needed. For cough/congestion  ibuprofen (ADVIL,MOTRIN) 200 MG tablet Take 400 mg by mouth every 6 (six) hours as needed. For pain  insulin detemir (LEVEMIR) 100 UNIT/ML injection Inject 65 Units into the skin 2 (two) times daily.  insulin lispro (HUMALOG) 100 UNIT/ML injection Inject 40-90 Units into the skin 5 (five) times daily as needed. Use 40-60 units with snacks, and 80-90 units with meals  levothyroxine (SYNTHROID, LEVOTHROID) 125 MCG  tablet Take 125 mcg by mouth daily.  metFORMIN (GLUCOPHAGE-XR) 500 MG 24 hr tablet Take 1,000 mg by mouth 2 (two) times daily.  Multiple Vitamin (MULITIVITAMIN WITH MINERALS) TABS Take 1 tablet by mouth daily.  omeprazole (PRILOSEC) 40 MG capsule Take 40 mg by mouth daily.  pirbuterol (MAXAIR) 200 MCG/INH inhaler Inhale 2 puffs into the lungs 2 (two) times daily as needed. For shortness of breath  potassium chloride SA (K-DUR,KLOR-CON) 20 MEQ tablet Take 20 mEq by mouth daily.  pravastatin (PRAVACHOL) 10 MG tablet Take 10 mg by mouth daily.  promethazine-dextromethorphan (PROMETHAZINE-DM) 6.25-15 MG/5ML syrup Take 10 mLs by mouth 3 (three) times daily as needed. For cough  simethicone (MYLICON) 80 MG chewable tablet Chew 80 mg by mouth 2 (two) times daily as needed. For gas  Vitamin D, Ergocalciferol, (DRISDOL) 50000 UNITS CAPS Take 50,000 Units by mouth every 7 (seven) days.  COENZYME Q-10 PO Take 1 tablet by mouth daily.  polyethylene glycol (MIRALAX / GLYCOLAX) packet Take 17 g by mouth daily as needed. For constipation    Inpatient Medications:  . bacitracin      . bisoprolol  15 mg Oral Daily  . docusate sodium  100 mg Oral BID  . droperidol      . ezetimibe  10 mg Oral Daily  . fentaNYL      . fluticasone  2 spray Each Nare Daily  . furosemide  40 mg Oral Daily  . hydrochlorothiazide  19 mg Oral Daily  . insulin aspart      . insulin aspart  0-15 Units Subcutaneous TID WC  . insulin aspart  0-5 Units Subcutaneous QHS  . insulin detemir  65 Units Subcutaneous BID  . levothyroxine  125 mcg Oral Daily  . metFORMIN  1,000 mg Oral BID  . mulitivitamin with minerals  1 tablet Oral Daily  . ondansetron      . pantoprazole  80 mg Oral Q1200  . potassium chloride SA  20 mEq Oral Daily  . simvastatin  5 mg Oral q1800  . sodium chloride      . vancomycin  1,000 mg Intravenous Once   . lactated ringers 75 mL/hr at 08/23/11 1959    Allergies:  Allergies  Allergen Reactions  .  Candesartan Cilexetil Other (See Comments)    Reaction unknown  . Clarithromycin Other (See Comments)    Reaction unknown  . Clopidogrel Bisulfate Other (See Comments)    Reaction unknown  . Ezetimibe Other (See Comments)    Reaction unknown  . Hydromorphone Hcl Other (See Comments)    Reaction unknown  . Metoclopramide Hcl Other (See Comments)    Reaction unknown  . Oxycodone-Acetaminophen Other (See Comments)    Reaction unknown  . Paroxetine Other (See Comments)  Reaction unknown  . Penicillins Other (See Comments)    Reaction unknown  . Pioglitazone Other (See Comments)    Reaction unknown  . Quinapril Hcl Other (See Comments)    Reaction unknown  . Repaglinide Other (See Comments)    Reaction unknown  . Valsartan Other (See Comments)    Reaction unknown  . Vancomycin Other (See Comments)    Reaction unknown   Social History  . Marital Status: Married   Occupational History  . Not on file.   Social History Main Topics  . Smoking status: Current Everyday Smoker -- 1.5 packs/day    Types: Cigarettes  . Alcohol Use: No  . Drug Use: No  . Sexually Active: Not Currently   Family History  Problem Relation Age of Onset  . Anesthesia problems Mother   . Anesthesia problems Sister      Review of Systems: General: negative for chills, fever, night sweats or weight changes.  Cardiovascular: (+) chest pain, shortness of breath, dyspnea on exertion; negative for edema, orthopnea, palpitations, or paroxysmal nocturnal dyspnea Dermatological: negative for rash Respiratory: negative for cough or wheezing Urologic: negative for hematuria Abdominal: negative for nausea, vomiting, diarrhea, bright red blood per rectum, melena, or hematemesis Neurologic: negative for visual changes, syncope, or dizziness MSK: (+) neck pain All other systems reviewed and are otherwise negative except as noted above.  Labs:  [facility] 08/24/11 0918  CKTOTAL 293*  CKMB 5.2*  TROPONINI  <0.30   Radiology/Studies:  None  EKG: 08/24/11 @ 0812 - sinus bradycardia 52 bpm, LAFB, unchanged from old EKG  Physical Exam: Blood pressure 124/78, pulse 64, temperature 98.3 F (36.8 C), temperature source Oral, resp. rate 18, height 5' (1.524 m), weight 197 lb 6 oz (89.529 kg), SpO2 93.00%. General: Overweight white female, in no acute distress. Head: Normocephalic, atraumatic, sclera non-icteric, no xanthomas, nares are without discharge.  Neck: Unable to assess with c-collar in place. Lungs: Clear bilaterally to auscultation without wheezes, rales, or rhonchi. Breathing is unlabored. Heart: RRR with S1 S2. 2/6 SEM LUSB; No rubs or gallops appreciated. Abdomen: Obese, Soft, non-tender, non-distended with normoactive bowel sounds. No hepatomegaly. No rebound/guarding. No obvious abdominal masses. Msk: (+) TTP of chest. Strength and tone appear normal for age. Extremities: No clubbing or cyanosis. Trace BLE edema.  Distal pedal pulses are 2+ and equal bilaterally. Neuro: Alert and oriented X 3. Moves all extremities spontaneously. Psych:  Responds to questions appropriately with a normal affect.   Assessment and Plan:  56 y.o. female w/ PMHx significant for CAD s/p BMS x3 RCA '02, HTN, HLD, DMII, Tobacco abuse, obesity and COPD who presented to [facility] on 08/23/2011 for planned anterior cervicectomy fusion and plating in the setting of cervical radiculopathy.  1. Chest Pain: She has a history of CAD with stenting in '02 and subsequent cath in '02 and '05 that showed no significant ISR and normal LV function. She reports an unremarkable nuclear myoview about 23yr ago. She is followed closely by cardiology in [place]. She has atypical chest pain, no acute ischemia on EKG and normal troponin. Her pain is likely musculoskeletal in nature in the setting of recent surgery. She is not currently on ASA due to recent surgery and will defer the resumption of this to surgery. Would  recommend follow up with her primary cardiologist as an outpatient for determination of necessity of further ischemic evaluation.   Signed, HOPE, JESSICA PA-C 08/24/2011, 2:58 PM  Patient seen, examined. Available data reviewed. Agree  with findings, assessment, and plan as outlined by [facility], PA-C. The patient has highly atypical chest pain, present ever since surgery. Symptoms sound musculoskeletal in nature. EKG is unremarkable and enzymes negative. OK to discharge home as she is at low risk from cardiac perspective. Otherwise as above.  Tonny Bollman, M.D. 08/24/2011 4:46 PM

## 2011-08-24 NOTE — Progress Notes (Signed)
Patient ID: Courtney Barker, female   DOB: 1955-11-11, 56 y.o.   MRN: [id number] Subjective:  The patient is alert and pleasant. She complains of some substernal chest pain. She does not appear ill. He has no shortness of breath  Objective: Vital signs in last 24 hours: Temp:  [97 F (36.1 C)-98.5 F (36.9 C)] 98.4 F (36.9 C) (03/08 0400) Pulse Rate:  [52-63] 52  (03/08 0400) Resp:  [14-18] 16  (03/08 0400) BP: (93-139)/(56-87) 139/56 mmHg (03/08 0400) SpO2:  [97 %-99 %] 97 % (03/08 0400) Weight:  [89.529 kg (197 lb 6 oz)] 89.529 kg (197 lb 6 oz) (03/07 1500)  Intake/Output from previous day: 03/07 0701 - 03/08 0700 In: 1400 [I.V.:1400] Out: 150 [Blood:150] Intake/Output this shift:    Physical exam the patient is alert and oriented. She is moving all 4 extremities well. Her dressing is clean and dry without evidence of hematoma or shift.  Lab Results: No results found for this basename: WBC:2,HGB:2,HCT:2,PLT:2 in the last 72 hours BMET No results found for this basename: NA:2,K:2,CL:2,CO2:2,GLUCOSE:2,BUN:2,CREATININE:2,CALCIUM:2 in the last 72 hours  Studies/Results: Dg Cervical Spine 2-3 Views  08/23/2011  *RADIOLOGY REPORT*  Clinical Data: C5-7 ACDF.  CERVICAL SPINE - 2-3 VIEW  Comparison: None.  Findings: We are provided with two views of the cervical spine in the lateral projection.  On the first image, a probe localizes C4- 5.  On the second image, anterior plate and screws are in place from C5-C7.  IMPRESSION: C5-7 ACDF.  Original Report Authenticated By: Bernadene Bell. Maricela Curet, M.D.    Assessment/Plan: Postop day 1: The patient appears to be doing well except as below.  Chest pain: I think this is likely from traction from surgery. But given her cardiac history I think we should get the EKG and cardiac enzymes.  LOS: 1 day     Devaris Quirk D 08/24/2011, 8:03 AM

## 2011-09-03 ENCOUNTER — Encounter (HOSPITAL_COMMUNITY): Payer: Self-pay | Admitting: Neurosurgery

## 2012-01-04 DIAGNOSIS — M5030 Other cervical disc degeneration, unspecified cervical region: Secondary | ICD-10-CM | POA: Insufficient documentation

## 2012-04-28 DIAGNOSIS — I2510 Atherosclerotic heart disease of native coronary artery without angina pectoris: Secondary | ICD-10-CM | POA: Insufficient documentation

## 2013-04-12 IMAGING — CR DG CHEST 2V
2 series · 2 of 2 positions shown · non-contrast
Comparison: 09/06/2005.

CLINICAL DATA: Preop.

CHEST - 2 VIEW

[view not recorded (1 of 2)]
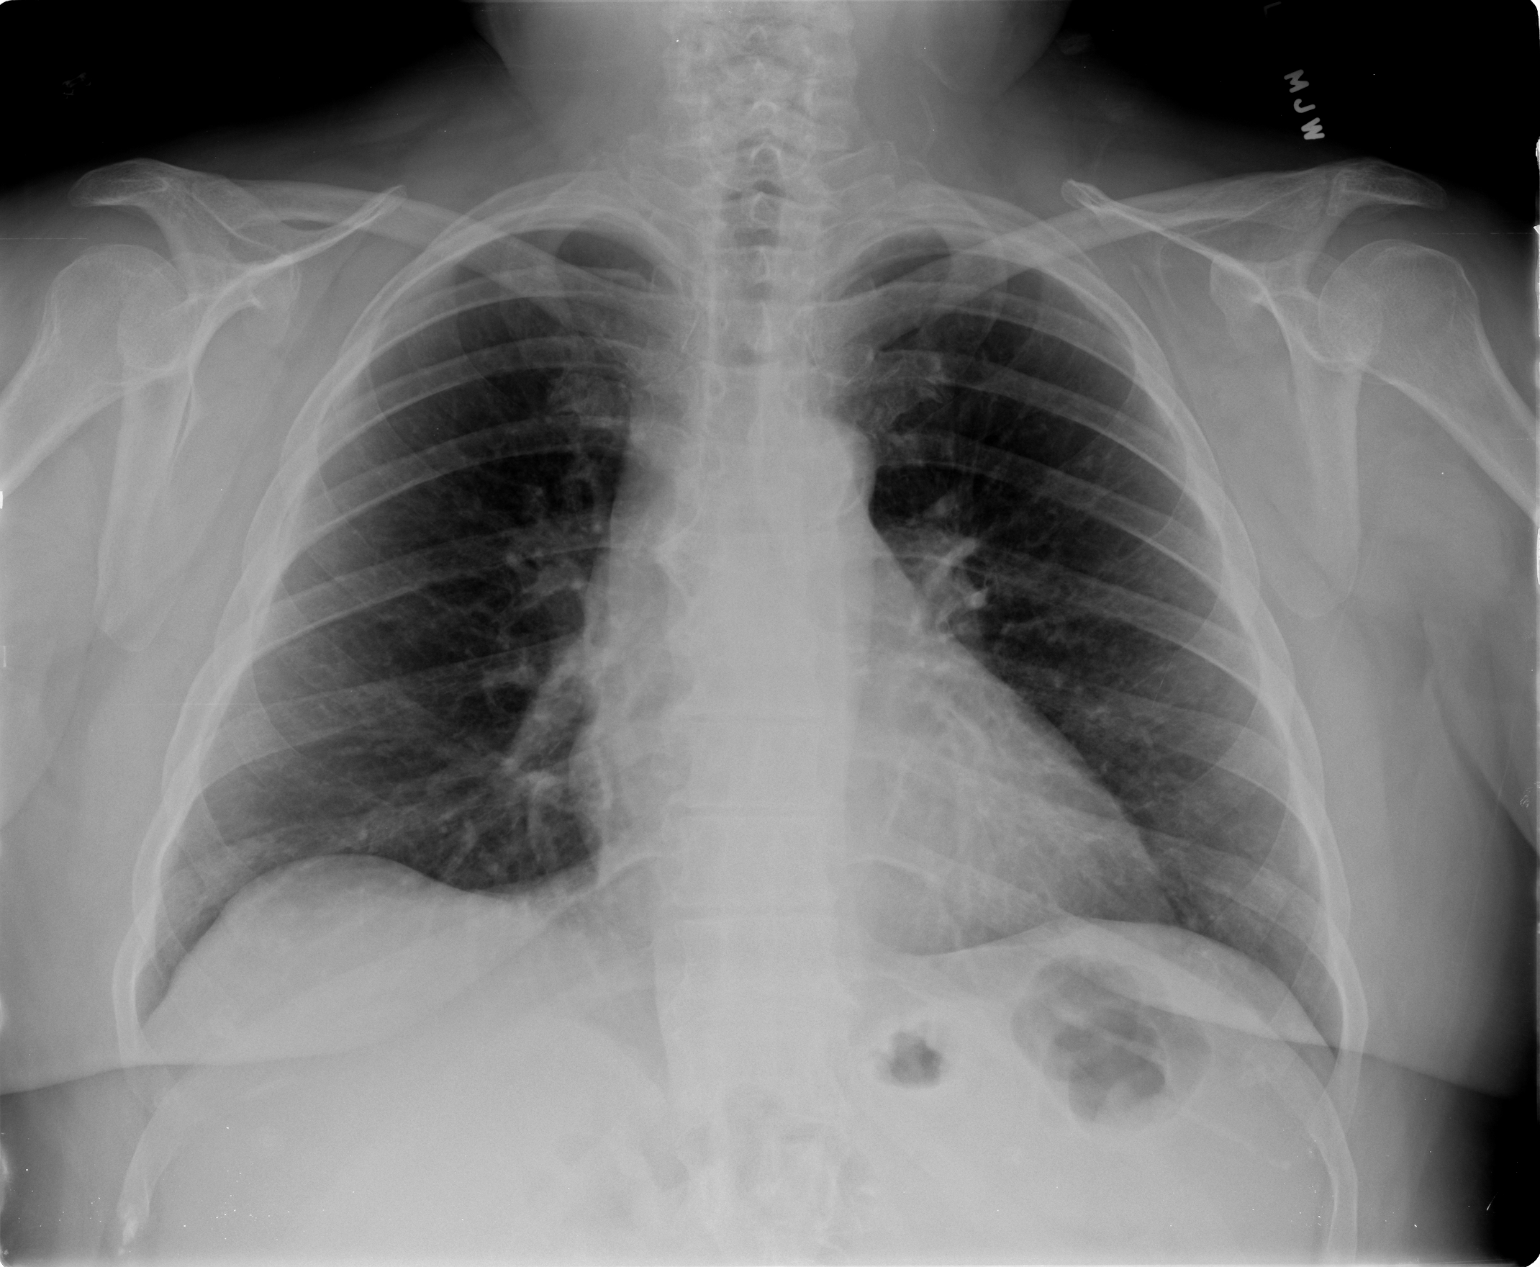

[view not recorded (2 of 2)]
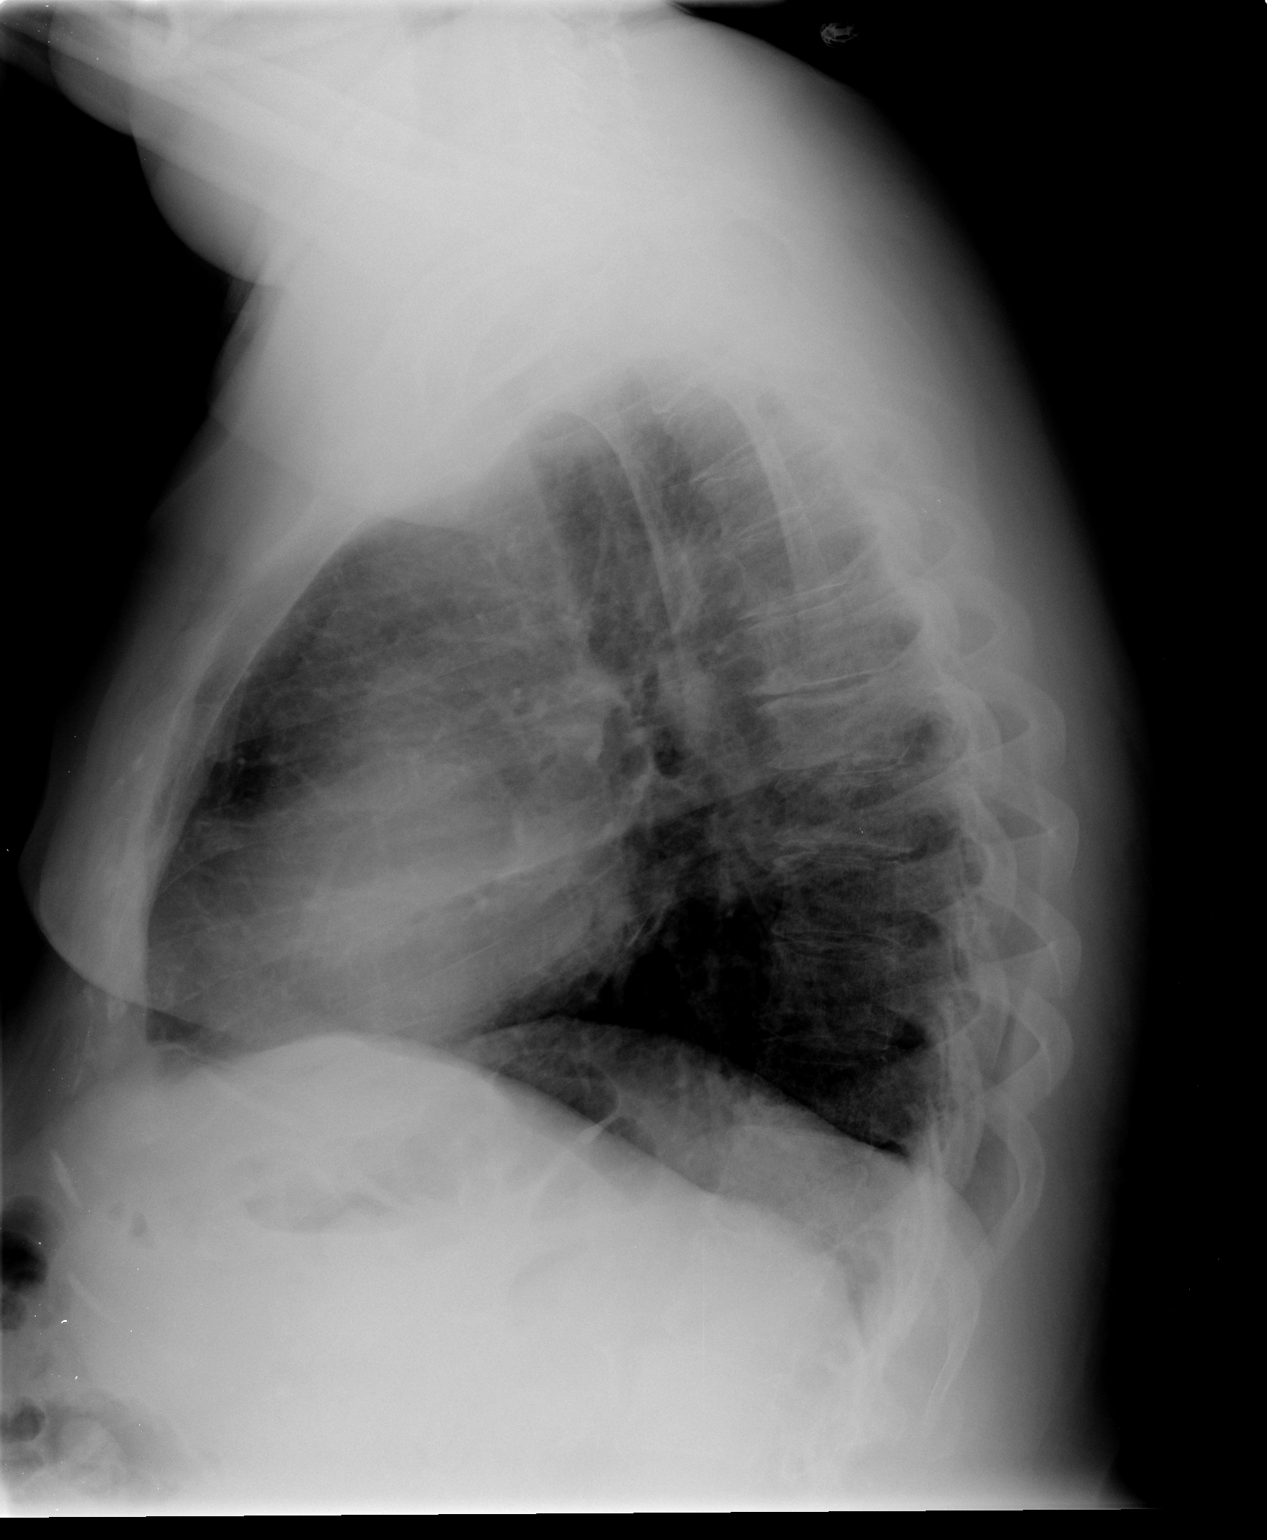

[2 of 2 positions shown; findings below may reference images not displayed]

FINDINGS: Trachea is midline.  Heart size normal.  Lungs are clear.
No pleural fluid.
IMPRESSION: No acute findings.

## 2013-04-19 IMAGING — CR DG CERVICAL SPINE 2 OR 3 VIEWS
1 series · 1 of 1 positions shown · non-contrast
Comparison: None.

CLINICAL DATA: C5-7 ACDF.

CERVICAL SPINE - 2-3 VIEW

[view not recorded]
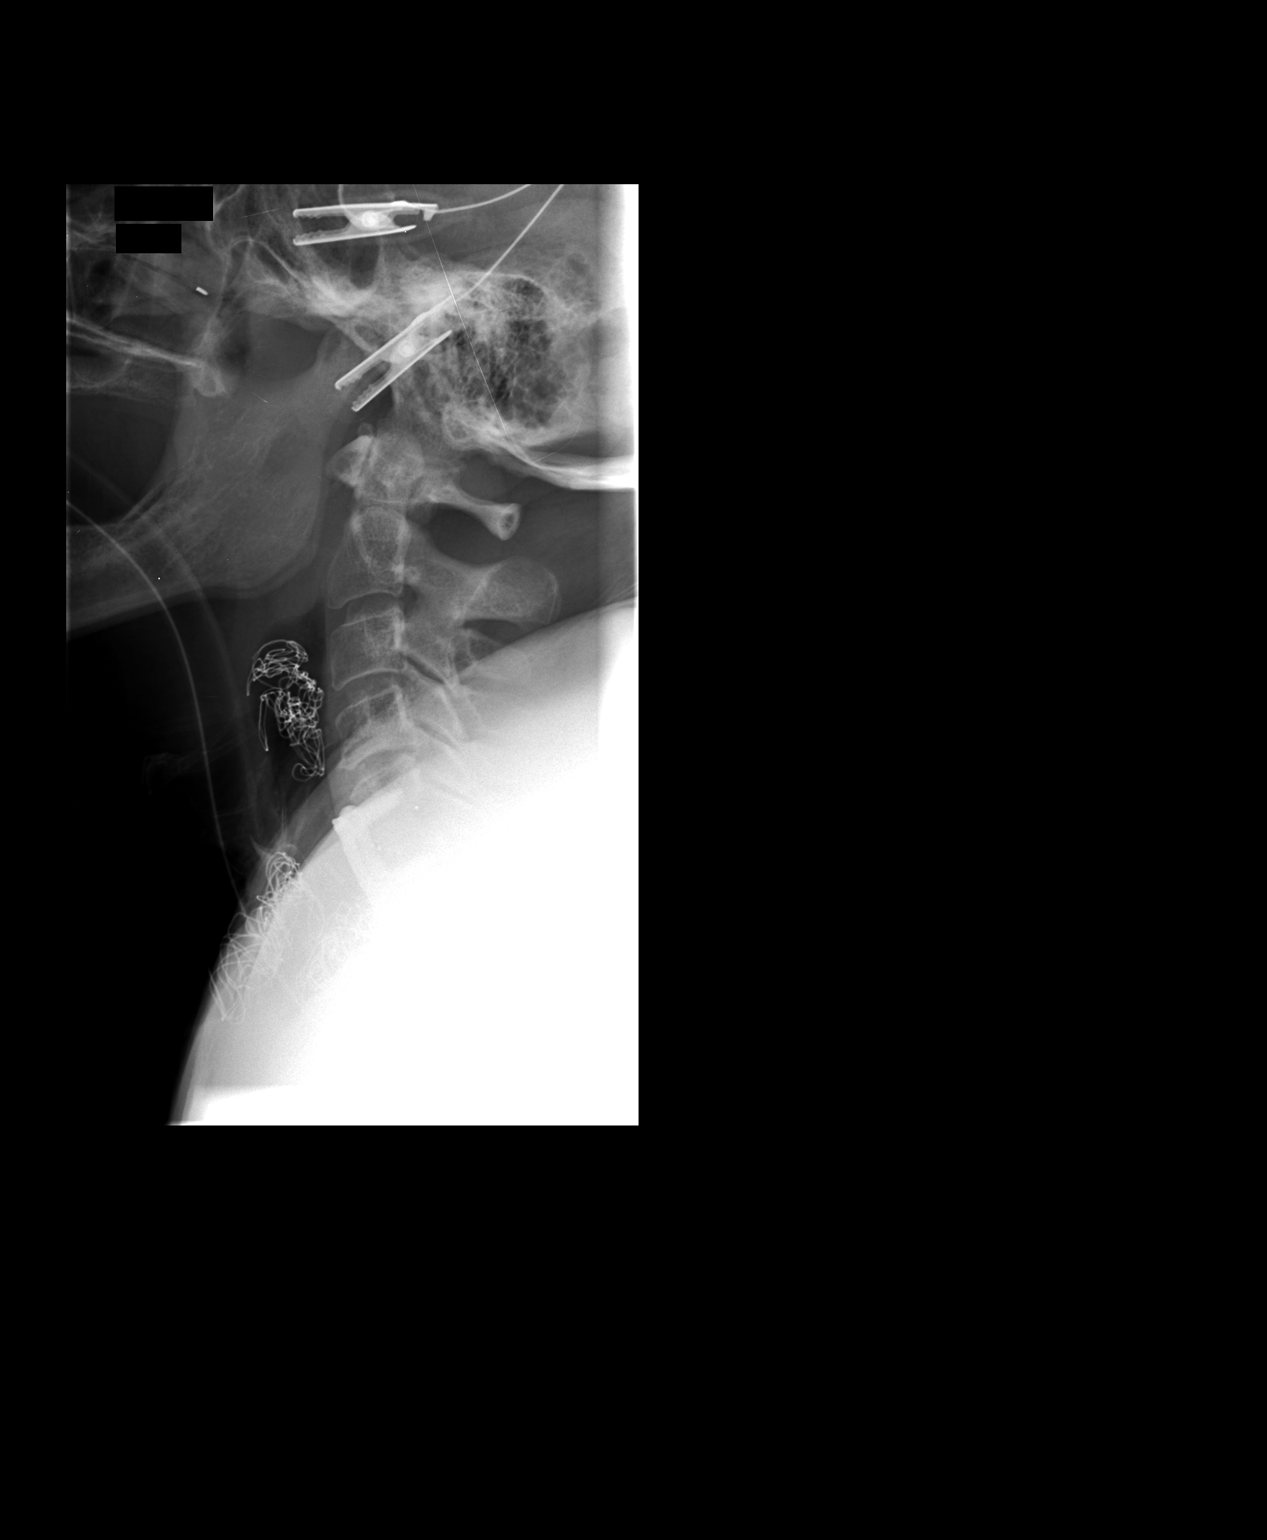

[1 of 1 positions shown; findings below may reference images not displayed]

FINDINGS: We are provided with two views of the cervical spine in
the lateral projection.  On the first image, a probe localizes C4-
5.  On the second image, anterior plate and screws are in place
from C5-C7.
IMPRESSION: C5-7 ACDF.

## 2013-12-16 DIAGNOSIS — I219 Acute myocardial infarction, unspecified: Secondary | ICD-10-CM

## 2013-12-16 HISTORY — PX: CARDIAC CATHETERIZATION: SHX172

## 2013-12-16 HISTORY — DX: Acute myocardial infarction, unspecified: I21.9

## 2014-04-07 DIAGNOSIS — M159 Polyosteoarthritis, unspecified: Secondary | ICD-10-CM | POA: Insufficient documentation

## 2015-02-18 ENCOUNTER — Encounter: Payer: Self-pay | Admitting: *Deleted

## 2015-02-22 ENCOUNTER — Encounter: Payer: Self-pay | Admitting: Internal Medicine

## 2015-02-22 ENCOUNTER — Ambulatory Visit (INDEPENDENT_AMBULATORY_CARE_PROVIDER_SITE_OTHER): Payer: Medicaid Other | Admitting: Internal Medicine

## 2015-02-22 ENCOUNTER — Other Ambulatory Visit: Payer: Self-pay

## 2015-02-22 VITALS — BP 132/84 | HR 54 | Ht 61.0 in | Wt 189.6 lb

## 2015-02-22 DIAGNOSIS — I209 Angina pectoris, unspecified: Secondary | ICD-10-CM

## 2015-02-22 DIAGNOSIS — R002 Palpitations: Secondary | ICD-10-CM

## 2015-02-22 DIAGNOSIS — I10 Essential (primary) hypertension: Secondary | ICD-10-CM | POA: Diagnosis not present

## 2015-02-22 DIAGNOSIS — Z720 Tobacco use: Secondary | ICD-10-CM | POA: Insufficient documentation

## 2015-02-22 DIAGNOSIS — E1159 Type 2 diabetes mellitus with other circulatory complications: Secondary | ICD-10-CM | POA: Insufficient documentation

## 2015-02-22 MED ORDER — RANOLAZINE ER 500 MG PO TB12: 500.0000 mg | ORAL_TABLET | Freq: Two times a day (BID) | ORAL | Status: DC

## 2015-02-22 NOTE — Patient Instructions (Addendum)
Medication Instructions:  Your physician has recommended you make the following change in your medication:  1) Start Renexa 500 mg twice daily    Labwork: None ordered  Testing/Procedures: Your physician has requested that you have a lexiscan myoview. For further information please visit [email or web address] Please follow instruction sheet, as given.  Your physician has recommended that you wear a holter monitor. Holter monitors are medical devices that record the heart's electrical activity. Doctors most often use these monitors to diagnose arrhythmias. Arrhythmias are problems with the speed or rhythm of the heartbeat. The monitor is a small, portable device. You can wear one while you do your normal daily activities. This is usually used to diagnose what is causing palpitations/syncope (passing out).    Follow-Up: Your physician wants you to follow-up in: 6 months with Dr Court Joy will receive a reminder letter in the mail two months in advance. If you don't receive a letter, please call our office to schedule the follow-up appointment.   Any Other Special Instructions Will Be Listed Below (If Applicable).

## 2015-02-22 NOTE — Progress Notes (Signed)
HPI Patient comes with multiple complaints.  She comes here looking for a second opinion, following with another cardiologist, Dr. Manon Hilding, though feels like she would like a second look and any recommendations.  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  She will take s/l NTG with eventual relief after several minutes but this gives her severe headaches and prefers to avoid nitrates, mentioning she has been prescribed Imdur but does not want to take it.  Mentions she is not convinced despite her cath last year that anyone really knows what is exactly wrong with her heart.  She denies syncope.  Describes some degree of orthostatic changes but is careful when standing and has never fainted or fallen.  She has another c/o R knee pain that seems to radiate to or from her groin, not positional or exertional.  All of her symptoms are random without a clear provoking factor.  Allergies  Allergen Reactions  . Clonidine Derivatives Shortness Of Breath and Swelling  . Lisinopril Shortness Of Breath and Swelling  . Losartan Palpitations  . Penicillins Anaphylaxis  . Plavix  [Clopidogrel Bisulfate] Itching  . Quinapril Hcl Shortness Of Breath and Swelling  . Repaglinide Palpitations  . Toprol Xl  [Metoprolol Tartrate] Shortness Of Breath and Swelling  . Trovan  [Alatrofloxacin] Shortness Of Breath and Swelling  . Atorvastatin     myalgias  . Clarithromycin Nausea And Vomiting  . Hydromorphone Hcl Nausea And Vomiting  . Morphine And Related Nausea And Vomiting  . Oxycodone-Acetaminophen     Body feels like bee stings  . Paroxetine Hcl     Freezing, burning from inside out.  . Pioglitazone Other (See Comments) and Swelling    Reaction unknown  . Valsartan Other  (See Comments)    Reaction unknown  . Candesartan Cilexetil Other (See Comments)    Reaction unknown  . Clopidogrel Bisulfate Other (See Comments)    Reaction unknown  . Ezetimibe Other (See Comments)    Reaction unknown  . Hydromorphone Hcl Other (See Comments)    Reaction unknown  . Metoclopramide Hcl Other (See Comments)    Reaction unknown  . Oxycodone-Acetaminophen Other (See Comments)    Reaction unknown  . Paroxetine Other (See Comments)    Reaction unknown  . Quinapril Hcl Other (See Comments)    Reaction unknown  . Vancomycin Other (See Comments)    Reaction unknown     Current Outpatient Prescriptions  Medication Sig Dispense Refill  . acetaminophen (TYLENOL) 500 MG tablet Take 500 mg by mouth every 6 (six) hours as needed (pain).    [name] albuterol (PROVENTIL HFA;VENTOLIN HFA) 108 (90 BASE) MCG/ACT inhaler Inhale 2 puffs into the lungs every 6 (six) hours as needed for wheezing or shortness of breath.    [name] aspirin 325 MG tablet Take 325 mg by mouth daily.    . bisacodyl (DULCOLAX) 5 MG EC tablet Take 5 mg by mouth daily as needed. constipation    . bisoprolol-hydrochlorothiazide (ZIAC) 10-6.25 MG per tablet Take 1 tablet by mouth 2 (two) times daily.  3  . fish oil-omega-3 fatty acids 1000 MG capsule Take 1 g by mouth 2 (two) times daily.     . fluticasone (FLONASE) 50 MCG/ACT nasal  spray Place 2 sprays into the nose daily.    . furosemide (LASIX) 40 MG tablet Take 20 mg by mouth daily.     [name] LANTUS 100 UNIT/ML injection Inject 72 Units into the skin at bedtime.  5  . magnesium oxide (MAG-OX) 400 MG tablet Take 400 mg by mouth daily.    . metFORMIN (GLUCOPHAGE-XR) 500 MG 24 hr tablet Take 1,000 mg by mouth 2 (two) times daily.    . nitroGLYCERIN (NITROSTAT) 0.4 MG SL tablet Place 0.4 mg under the tongue every 5 (five) minutes x 3 doses as needed. Chest pain    . NOVOLOG 100 UNIT/ML injection Inject into the skin as directed. PER SLIDING SCALE  5  . polyethylene glycol  (MIRALAX / GLYCOLAX) packet Take 17 g by mouth daily as needed. For constipation    . potassium chloride SA (K-DUR,KLOR-CON) 20 MEQ tablet Take 20 mEq by mouth daily.    . RABEprazole (ACIPHEX) 20 MG tablet Take 20 mg by mouth daily.    [name] SYNTHROID 150 MCG tablet Take 1 tablet by mouth daily.  5  . traMADol (ULTRAM) 50 MG tablet Take 50 mg by mouth every 8 (eight) hours as needed (PAIN).    . Vitamin D, Ergocalciferol, (DRISDOL) 50000 UNITS CAPS Take 50,000 Units by mouth every 7 (seven) days.     No current facility-administered medications for this visit.     Past Medical History  Diagnosis Date  . PONV (postoperative nausea and vomiting)   . Coronary artery disease     s/p BMS x3 RCA '02; repeat cath in '02 and '05 were without significant ISR and normal LV function; pt reported unremarkable nuclear myoview 2011/12  . Hypertension   . Angina     OFF AND ON ... USES NTG  . Asthma   . Shortness of breath     WITH EXERTION  . Diabetes mellitus     INSULIN AND METFORMIN  . Sleep apnea     NO STUDY DONE .[name].   . Hypothyroidism   . GERD (gastroesophageal reflux disease)   . Neuromuscular disorder   . Headache(784.0)   . Arthritis   . Depression   . Fibromyalgia     PT STATES "NOT SURE"  . Anxiety     CLAUSTROPHOBIA  . Hyperlipidemia     statin intolerant, to be followed by lipid clinic  . Tobacco abuse   . Cervical spondylosis with myelopathy     s/p C5-6 and C6-7 Anterior cervical discectomy/decompression; C5-6 and C6-7 interbody arthrodesis with local morcellized autograft bone and Actifuse bone graft extender; insertion of interbody prosthesis at C5-6 and C6-7 (Zimmer peek interbody prosthesis); anterior cervical plating from C5-6 and C6-7 with globus titanium plate 01/20/68    ROS:   All systems reviewed and negative except as noted in the HPI.   Past Surgical History  Procedure Laterality Date  . Coronary angioplasty      s/p BMS x3 RCA '02  . Tonsillectomy    .  Colon surgery      POLUPS REMOVED DURING COLONOSCOPY  . Colon surgery  2007     RESECTION OF BOWEL    . Tubal ligation  1981  . Abdominal hysterectomy  2000  . 2 level anterior cervicectomy fusion and plating  08/23/11  . Anterior cervical decomp/discectomy fusion  08/23/2011    Procedure: ANTERIOR CERVICAL DECOMPRESSION/DISCECTOMY FUSION 2 LEVELS;  Surgeon: Cristi Loron, MD;  Location: [facility];  Service: Neurosurgery;  Laterality: N/A;  Anterior Cervical Five-Six/Six-Seven Decompression with Fusion with Interbody Prothesis, Plating, and Bonegraft     Family History  Problem Relation Age of Onset  . Anesthesia problems Mother   . Anesthesia problems Sister   . Neuropathy Father   . Heart attack Father     Cause of death  . Neuropathy Sister   . Diabetes Sister   . Heart Problems Sister   . Diabetes Maternal Grandfather   . Lupus Sister      Social History   Social History  . Marital Status: Married    Spouse Name: N/A  . Number of Children: N/A  . Years of Education: N/A   Occupational History  . Not on file.   Social History Main Topics  . Smoking status: Current Every Day Smoker -- 1.50 packs/day    Types: Cigarettes  . Smokeless tobacco: Not on file  . Alcohol Use: No  . Drug Use: No  . Sexual Activity: Not Currently   Other Topics Concern  . Not on file   Social History Narrative     BP 132/84 mmHg  Pulse 54  Ht  (1.549 m)  Wt 189 lb 9.6 oz (86.002 kg)  BMI 35.84 kg/m2  Physical Exam:  Well appearing middle aged woman, NAD HEENT: Unremarkable except for hirsutism Neck:  6 cm JVD, no thyromegally Lymphatics:  No adenopathy Back:  No CVA tenderness Lungs:  Clear with no wheezes HEART:  Regular rate rhythm, no murmurs, no rubs, no clicks Abd:  soft, positive bowel sounds, no organomegally, no rebound, no guarding Ext:  2 plus pulses, no edema, no cyanosis, no clubbing Skin:  No rashes no nodules Neuro:  CN II through XII intact, motor  grossly intact  EKG - nsr with high laterall STT changes    Assess/Plan:

## 2015-02-22 NOTE — Assessment & Plan Note (Signed)
Her blood pressure is improved but still elevated. She has done better with uptitration of her beta blocker/diuretic. She will continue this medication. She will try and lose weight and reduce her sodium intake.

## 2015-02-22 NOTE — Assessment & Plan Note (Signed)
The etiology is unclear. She has increased cardiac awareness. She will continue her current meds. She will wear a 48 hour holter.

## 2015-02-22 NOTE — Assessment & Plan Note (Signed)
Her symptoms are class 2. She is encouraged to increase her physical activity. Will try Ranexa as she has been unable or unwilling to take Imdur. In addition, we plan lexiscan myoview. Her ECG is abnormal and she cannot walk.

## 2015-02-22 NOTE — Assessment & Plan Note (Signed)
I discussed smoking cessation with the patient and gradually reducing her tobacco use. She had multiple excuses for why she could not stop smoking.

## 2015-02-22 NOTE — Assessment & Plan Note (Signed)
She is intolerant of statins and zetia. She will continue her current meds. I have asked her to lose weight and increase her physical activity.

## 2015-03-21 ENCOUNTER — Ambulatory Visit (INDEPENDENT_AMBULATORY_CARE_PROVIDER_SITE_OTHER): Payer: Medicaid Other

## 2015-03-21 DIAGNOSIS — R002 Palpitations: Secondary | ICD-10-CM

## 2015-03-27 ENCOUNTER — Telehealth: Payer: Self-pay | Admitting: Internal Medicine

## 2015-03-27 NOTE — Telephone Encounter (Signed)
I was contacted by lab poor regarding a critical result on 48 hour Holter monitor worn from 10/3 through 03/23/15 by the patient.  This reportedly showed predominantly sinus rhythm and sinus bradycardia with several episodes of idioventricular rhythm with rates in the 40s to 50s.  Patient reported some symptoms in the diary including lightheadedness and palpitations, though it is unclear if it corresponded temporally to the idioventricular rhythm.  I will attempt to contact the patient by phone later today to assess her symptoms.  A copy of the strips have been sent by [facility] to Dr. Ladona Ridgel.  I will forward this to him as well for his review.

## 2015-03-27 NOTE — Telephone Encounter (Signed)
I spoke with Ms. Thiesse by phone regarding the results of her Holter monitor, showing NSR, sinus bradycardia, and idioventricular rhythm.  Patient reports intermittent palpitations, fatigue, and mild lightheadedness while wearing the monitor.  She has not had any chest pain or syncope.  I advised her to contact Dr. Lubertha Basque office for further instructions and/or to schedule a follow-up appointment if she had not heard anything from his office by tomorrow afternoon.  If she experiences worsening symptoms in the meantime, the patient was advised to seek immediate medical attention.

## 2015-03-28 ENCOUNTER — Telehealth: Payer: Self-pay | Admitting: Internal Medicine

## 2015-03-28 NOTE — Telephone Encounter (Signed)
New Message       Pt calling stating that [facility] called her and stated that she needed to get in to see Dr. Ladona Ridgel asap because there was something wrong with her Holter monitor results. Please call back and advise.

## 2015-03-28 NOTE — Telephone Encounter (Signed)
Patient has an appointment with Gypsy Balsam NP on Wednesday at 2:00 PM. Copy of 48 hour monitor is in Amber's box.

## 2015-03-30 ENCOUNTER — Ambulatory Visit (INDEPENDENT_AMBULATORY_CARE_PROVIDER_SITE_OTHER): Payer: Medicaid Other | Admitting: Nurse Practitioner

## 2015-03-30 ENCOUNTER — Encounter: Payer: Self-pay | Admitting: Nurse Practitioner

## 2015-03-30 VITALS — BP 158/80 | HR 61 | Ht 61.0 in | Wt 187.0 lb

## 2015-03-30 DIAGNOSIS — R0789 Other chest pain: Secondary | ICD-10-CM | POA: Diagnosis not present

## 2015-03-30 DIAGNOSIS — Z720 Tobacco use: Secondary | ICD-10-CM

## 2015-03-30 DIAGNOSIS — I10 Essential (primary) hypertension: Secondary | ICD-10-CM

## 2015-03-30 DIAGNOSIS — R002 Palpitations: Secondary | ICD-10-CM | POA: Diagnosis not present

## 2015-03-30 DIAGNOSIS — Z9889 Other specified postprocedural states: Secondary | ICD-10-CM | POA: Insufficient documentation

## 2015-03-30 DIAGNOSIS — Z955 Presence of coronary angioplasty implant and graft: Secondary | ICD-10-CM | POA: Insufficient documentation

## 2015-03-30 LAB — CBC
HCT: 43.6 % (ref 36.0–46.0)
Hemoglobin: 15.5 g/dL — ABNORMAL HIGH (ref 12.0–15.0)
MCH: 34 pg (ref 26.0–34.0)
MCHC: 35.6 g/dL (ref 30.0–36.0)
MCV: 95.6 fL (ref 78.0–100.0)
MPV: 11.5 fL (ref 8.6–12.4)
Platelets: 210 10*3/uL (ref 150–400)
RBC: 4.56 MIL/uL (ref 3.87–5.11)
RDW: 13.4 % (ref 11.5–15.5)
WBC: 10.2 10*3/uL (ref 4.0–10.5)

## 2015-03-30 LAB — BASIC METABOLIC PANEL
BUN: 16 mg/dL (ref 7–25)
CO2: 24 mmol/L (ref 20–31)
Calcium: 9.6 mg/dL (ref 8.6–10.4)
Chloride: 98 mmol/L (ref 98–110)
Creat: 0.8 mg/dL (ref 0.50–1.05)
Glucose, Bld: 338 mg/dL — ABNORMAL HIGH (ref 65–99)
Potassium: 4.2 mmol/L (ref 3.5–5.3)
Sodium: 135 mmol/L (ref 135–146)

## 2015-03-30 LAB — MAGNESIUM: Magnesium: 1.8 mg/dL (ref 1.5–2.5)

## 2015-03-30 NOTE — Progress Notes (Signed)
Electrophysiology Office Note Date: 03/31/2015  ID:  Courtney Barker, DOB 1955-11-28, MRN [id number]  PCP: Martyn Malay, MD Electrophysiologist: Ladona Ridgel  CC: follow up on holter monitor  Courtney Barker is a 59 y.o. female seen today for Dr Ladona Ridgel. She was seen last mont by Dr Ladona Ridgel with multiple complaints.  Myoview was ordered for chest pain, but has not yet been done.  She also had palpitations and wore an event monitor which demonstrated sinus rhythm, sinus bradycardia,SVT and intermittent AIVR.  Her AIVR and SVT were not associated with symptoms according to diary.   She presents today for routine electrophysiology followup.  Since last being seen in our clinic, the patient reports doing very well.  She denies palpitations, dyspnea, PND, orthopnea, nausea, vomiting, dizziness, syncope, edema, weight gain, or early satiety. She has persistent atypical chest pain not related to exertion.   Past Medical History  Diagnosis Date  . Coronary artery disease     s/p BMS x3 RCA '02; repeat cath in '02 and '05 were without significant ISR and normal LV function; pt reported unremarkable nuclear myoview 2011/12  . Hypertension   . Asthma   . Diabetes mellitus   . Sleep apnea     NO STUDY DONE .[name].   . Hypothyroidism   . GERD (gastroesophageal reflux disease)   . Neuromuscular disorder (HCC)   . Headache(784.0)   . Arthritis   . Depression   . Fibromyalgia   . Anxiety   . Hyperlipidemia     statin intolerant, to be followed by lipid clinic  . Tobacco abuse   . Cervical spondylosis with myelopathy     s/p C5-6 and C6-7 Anterior cervical discectomy/decompression; C5-6 and C6-7 interbody arthrodesis with local morcellized autograft bone and Actifuse bone graft extender; insertion of interbody prosthesis at C5-6 and C6-7 (Zimmer peek interbody prosthesis); anterior cervical plating from C5-6 and C6-7 with globus titanium plate 06/20/22   Past Surgical History  Procedure Laterality Date  .  Coronary angioplasty      s/p BMS x3 RCA '02  . Tonsillectomy    . Colon surgery      POLUPS REMOVED DURING COLONOSCOPY  . Colon surgery  2007     RESECTION OF BOWEL    . Tubal ligation  1981  . Abdominal hysterectomy  2000  . 2 level anterior cervicectomy fusion and plating  08/23/11  . Anterior cervical decomp/discectomy fusion  08/23/2011    Procedure: ANTERIOR CERVICAL DECOMPRESSION/DISCECTOMY FUSION 2 LEVELS;  Surgeon: Cristi Loron, MD;  Location: [facility];  Service: Neurosurgery;  Laterality: N/A;  Anterior Cervical Five-Six/Six-Seven Decompression with Fusion with Interbody Prothesis, Plating, and Bonegraft    Current Outpatient Prescriptions  Medication Sig Dispense Refill  . bisoprolol-hydrochlorothiazide (ZIAC) 5-6.25 MG tablet Take 2 tablets by mouth 2 (two) times daily.    [name] acetaminophen (TYLENOL) 500 MG tablet Take 500 mg by mouth every 6 (six) hours as needed (pain).    [name] albuterol (PROVENTIL HFA;VENTOLIN HFA) 108 (90 BASE) MCG/ACT inhaler Inhale 2 puffs into the lungs every 6 (six) hours as needed for wheezing or shortness of breath.    [name] aspirin 325 MG tablet Take 325 mg by mouth daily.    . bisacodyl (DULCOLAX) 5 MG EC tablet Take 5 mg by mouth daily as needed. constipation    . fish oil-omega-3 fatty acids 1000 MG capsule Take 1 g by mouth 2 (two) times daily.     . fluticasone (FLONASE) 50  MCG/ACT nasal spray Place 2 sprays into the nose daily.    . furosemide (LASIX) 40 MG tablet Take 20 mg by mouth daily.     [name] LANTUS 100 UNIT/ML injection Inject 72 Units into the skin at bedtime.  5  . magnesium oxide (MAG-OX) 400 MG tablet Take 400 mg by mouth daily.    . metFORMIN (GLUCOPHAGE-XR) 500 MG 24 hr tablet Take 1,000 mg by mouth 2 (two) times daily.    . nitroGLYCERIN (NITROSTAT) 0.4 MG SL tablet Place 0.4 mg under the tongue every 5 (five) minutes x 3 doses as needed. Chest pain    . NOVOLOG 100 UNIT/ML injection Inject into the skin as directed. PER SLIDING SCALE   5  . polyethylene glycol (MIRALAX / GLYCOLAX) packet Take 17 g by mouth daily as needed. For constipation    . potassium chloride SA (K-DUR,KLOR-CON) 20 MEQ tablet Take 20 mEq by mouth daily.    . RABEprazole (ACIPHEX) 20 MG tablet Take 20 mg by mouth daily.    . ranolazine (RANEXA) 500 MG 12 hr tablet Take 1 tablet (500 mg total) by mouth 2 (two) times daily. 60 tablet 11  . SYNTHROID 150 MCG tablet Take 1 tablet by mouth daily.  5  . traMADol (ULTRAM) 50 MG tablet Take 50 mg by mouth every 8 (eight) hours as needed (PAIN).    . Vitamin D, Ergocalciferol, (DRISDOL) 50000 UNITS CAPS Take 50,000 Units by mouth every 7 (seven) days.     No current facility-administered medications for this visit.    Allergies:   Clonidine derivatives; Lisinopril; Losartan; Penicillins; Plavix ; Quinapril hcl; Repaglinide; Toprol xl ; Trovan ; Atorvastatin; Clarithromycin; Hydromorphone hcl; Morphine and related; Oxycodone-acetaminophen; Paroxetine hcl; Pioglitazone; Valsartan; Candesartan cilexetil; Clopidogrel bisulfate; Ezetimibe; Hydromorphone hcl; Metoclopramide hcl; Oxycodone-acetaminophen; Paroxetine; Quinapril hcl; and Vancomycin   Social History: Social History   Social History  . Marital Status: Married    Spouse Name: N/A  . Number of Children: N/A  . Years of Education: N/A   Occupational History  . Not on file.   Social History Main Topics  . Smoking status: Current Every Day Smoker -- 1.50 packs/day    Types: Cigarettes  . Smokeless tobacco: Not on file  . Alcohol Use: No  . Drug Use: No  . Sexual Activity: Not Currently   Other Topics Concern  . Not on file   Social History Narrative    Family History: Family History  Problem Relation Age of Onset  . Anesthesia problems Mother   . Anesthesia problems Sister   . Neuropathy Father   . Heart attack Father     Cause of death  . Neuropathy Sister   . Diabetes Sister   . Heart Problems Sister   . Diabetes Maternal Grandfather    . Lupus Sister     Review of Systems: All other systems reviewed and are otherwise negative except as noted above.   Physical Exam: VS:  BP 158/80 mmHg  Pulse 61  Ht 5\' 1"  (1.549 m)  Wt 187 lb (84.823 kg)  BMI 35.35 kg/m2  SpO2 97% , BMI Body mass index is 35.35 kg/(m^2). Wt Readings from Last 3 Encounters:  03/30/15 187 lb (84.823 kg)  02/22/15 189 lb 9.6 oz (86.002 kg)  08/23/11 197 lb 6 oz (89.529 kg)    GEN- The patient is elderly, obese appearing, alert and oriented x 3 today, strong tobacco smell HEENT: normocephalic, atraumatic; sclera clear, conjunctiva pink; hearing intact; oropharynx clear;  neck supple  Lungs- Clear to ausculation bilaterally, normal work of breathing.  No wheezes, rales, rhonchi Heart- Regular rate and rhythm, no murmurs, rubs or gallops  GI- obese, non-tender, non-distended, bowel sounds present  Extremities- no clubbing, cyanosis, or edema  MS- no significant deformity or atrophy Skin- warm and dry, no rash or lesion  Psych- euthymic mood, full affect Neuro- strength and sensation are intact   EKG:  EKG is not ordered today.  Recent Labs: 03/30/2015: BUN 16; Creat 0.80; Hemoglobin 15.5*; Magnesium 1.8; Platelets 210; Potassium 4.2; Sodium 135    Other studies Reviewed: Additional studies/ records that were reviewed today include: Dr Lubertha Basque office notes, recent myoview  Assessment and Plan: 1. Palpitations Holter monitor reviewed She has had significant improvement in symptomatic palpitations with addition of Ziac CBC, BMET, Mg today TSH being checked later this week by endocrine With symptomatic improvement, will not change medical therapy at this time  2. Chest pain Lexiscan myoview ordered by Dr Ladona Ridgel at last visit She is convinced she is unable to have myoview due to clausterphobia Given atypical chest pain and cath last year, will allow for further GI and endocrine evaluation and follow up in 2 weeks She is clear that the  chest pressure she currently has is not associated with exertion and not like her previous pain with MI. She has also not had recent chest pain  3.  Tobacco abuse The patient states she is trying to quit  4.  HTN Stable No change required today    Current medicines are reviewed at length with the patient today.   The patient does not have concerns regarding her medicines.  The following changes were made today:  none  Labs/ tests ordered today include: CBC, BMET, Mg    Disposition:   Follow up with me in 2 weeks   Signed, Gypsy Balsam, NP 03/31/2015 10:10 AM   [facility] [address] [place] [address] [phone] (office) [phone] (fax)

## 2015-03-30 NOTE — Patient Instructions (Addendum)
Medication Instructions: Your physician recommends that you continue on your current medications as directed. Please refer to the Current Medication list given to you today.    Labwork:  BMET CBC MAG    Testing/Procedures: NONE ORDER TODAY    Follow-Up:    WITH AMBER SEILER 04/13/15 MAY USE ONE OF BLOCKED TIMES       Any Other Special Instructions Will Be Listed Below (If Applicable).

## 2015-04-04 ENCOUNTER — Telehealth: Payer: Self-pay | Admitting: *Deleted

## 2015-04-04 NOTE — Telephone Encounter (Signed)
-----   Message from Marily Lente, NP sent at 03/31/2015  7:52 AM EDT ----- Please notify patient of stable labs. She should follow up with her PCP about elevated blood sugar

## 2015-04-08 ENCOUNTER — Encounter (HOSPITAL_COMMUNITY): Payer: Medicaid Other

## 2015-04-12 ENCOUNTER — Encounter (HOSPITAL_COMMUNITY): Payer: Medicaid Other

## 2015-04-13 ENCOUNTER — Ambulatory Visit: Payer: Medicaid Other | Admitting: Nurse Practitioner

## 2015-05-16 ENCOUNTER — Ambulatory Visit: Payer: Medicaid Other | Admitting: Nurse Practitioner

## 2015-05-19 ENCOUNTER — Ambulatory Visit: Payer: Medicaid Other | Admitting: Physician Assistant

## 2015-05-26 ENCOUNTER — Ambulatory Visit (INDEPENDENT_AMBULATORY_CARE_PROVIDER_SITE_OTHER): Payer: Medicaid Other | Admitting: Nurse Practitioner

## 2015-05-26 ENCOUNTER — Encounter: Payer: Self-pay | Admitting: Nurse Practitioner

## 2015-05-26 VITALS — BP 142/90 | HR 58 | Ht 61.0 in | Wt 188.4 lb

## 2015-05-26 DIAGNOSIS — R002 Palpitations: Secondary | ICD-10-CM | POA: Diagnosis not present

## 2015-05-26 DIAGNOSIS — R079 Chest pain, unspecified: Secondary | ICD-10-CM

## 2015-05-26 NOTE — Patient Instructions (Signed)
Medication Instructions:   Your physician recommends that you continue on your current medications as directed. Please refer to the Current Medication list given to you today.     If you need a refill on your cardiac medications before your next appointment, please call your pharmacy.  Labwork: NONE ORDER TODAY    Testing/Procedures:  Your physician has requested that you have a lexiscan myoview. For further information please visit [email or web address] Please follow instruction sheet, as given.     Follow-Up:  Your physician wants you to follow-up in:  IN 6   MONTHS WITH DR Court Joy will receive a reminder letter in the mail two months in advance. If you don't receive a letter, please call our office to schedule the follow-up appointment.      Any Other Special Instructions Will Be Listed Below (If Applicable).

## 2015-05-26 NOTE — Progress Notes (Signed)
Electrophysiology Office Note Date: 05/26/2015  ID:  Courtney Barker, DOB [birth year], MRN [id number]  PCP: Martyn Malay, MD Electrophysiologist: Ladona Ridgel  CC: follow up on chest pain  Courtney Barker is a 59 y.o. female seen today for Dr Ladona Ridgel. She was seen last by me 2 months ago for follow up of palpitations and chest pain. Myoview was recommended at that time, but the patient declined. She has since been evaluated by GI with no cause for chest pain found.  She presents today for routine electrophysiology followup.  Since last being seen in our clinic, the patient reports doing reasonably well.  She has persistent occasional chest pain sometimes with exertion and sometimes at rest. She describes it as a pressure.  She has not taken NTG.  She denies palpitations, dyspnea, PND, orthopnea, nausea, vomiting, dizziness, syncope, edema, weight gain, or early satiety.    Past Medical History  Diagnosis Date  . Coronary artery disease     s/p BMS x3 RCA '02; repeat cath in '02 and '05 were without significant ISR and normal LV function; pt reported unremarkable nuclear myoview 2011/12  . Hypertension   . Asthma   . Diabetes mellitus   . Sleep apnea     NO STUDY DONE .[name].   . Hypothyroidism   . GERD (gastroesophageal reflux disease)   . Neuromuscular disorder (HCC)   . Headache(784.0)   . Arthritis   . Depression   . Fibromyalgia   . Anxiety   . Hyperlipidemia     statin intolerant, to be followed by lipid clinic  . Tobacco abuse   . Cervical spondylosis with myelopathy     s/p C5-6 and C6-7 Anterior cervical discectomy/decompression; C5-6 and C6-7 interbody arthrodesis with local morcellized autograft bone and Actifuse bone graft extender; insertion of interbody prosthesis at C5-6 and C6-7 (Zimmer peek interbody prosthesis); anterior cervical plating from C5-6 and C6-7 with globus titanium plate 01/22/75   Past Surgical History  Procedure Laterality Date  . Coronary angioplasty      s/p  BMS x3 RCA '02  . Tonsillectomy    . Colon surgery      POLUPS REMOVED DURING COLONOSCOPY  . Colon surgery  2007     RESECTION OF BOWEL    . Tubal ligation  1981  . Abdominal hysterectomy  2000  . 2 level anterior cervicectomy fusion and plating  08/23/11  . Anterior cervical decomp/discectomy fusion  08/23/2011    Procedure: ANTERIOR CERVICAL DECOMPRESSION/DISCECTOMY FUSION 2 LEVELS;  Surgeon: Cristi Loron, MD;  Location: [facility];  Service: Neurosurgery;  Laterality: N/A;  Anterior Cervical Five-Six/Six-Seven Decompression with Fusion with Interbody Prothesis, Plating, and Bonegraft    Current Outpatient Prescriptions  Medication Sig Dispense Refill  . acetaminophen (TYLENOL) 500 MG tablet Take 500 mg by mouth every 6 (six) hours as needed (pain).    [name] albuterol (PROVENTIL HFA;VENTOLIN HFA) 108 (90 BASE) MCG/ACT inhaler Inhale 2 puffs into the lungs every 6 (six) hours as needed for wheezing or shortness of breath.    [name] aspirin 325 MG tablet Take 325 mg by mouth daily.    . bisacodyl (DULCOLAX) 5 MG EC tablet Take 5 mg by mouth daily as needed. constipation    . bisoprolol-hydrochlorothiazide (ZIAC) 5-6.25 MG tablet Take 2 tablets by mouth 2 (two) times daily.    . fish oil-omega-3 fatty acids 1000 MG capsule Take 1 g by mouth 2 (two) times daily.     . fluticasone (FLONASE)  50 MCG/ACT nasal spray Place 2 sprays into the nose daily.    . furosemide (LASIX) 40 MG tablet Take 20 mg by mouth daily.     [name] LANTUS 100 UNIT/ML injection Inject 75 Units into the skin at bedtime.   5  . magnesium oxide (MAG-OX) 400 MG tablet Take 400 mg by mouth daily.    . metFORMIN (GLUCOPHAGE-XR) 500 MG 24 hr tablet Take 1,000 mg by mouth 2 (two) times daily.    . nitroGLYCERIN (NITROSTAT) 0.4 MG SL tablet Place 0.4 mg under the tongue every 5 (five) minutes x 3 doses as needed. Chest pain    . NOVOLOG 100 UNIT/ML injection Inject 15 Units into the skin as directed. INJECT 15 UNITS INTO THE SKIN WITH  BREAKFAST, INJECT 20 UNITS INTO THE SKIN AT LUNCH TIME, INJECT 20 UNITS INTO THE SKIN WITH SUPPER. ANY TIME YOUR BG ARE GREATER THAN 150, INJECT INTO THE SKIN AS DIRECTED PER SLIDING SCALE  5  . polyethylene glycol (MIRALAX / GLYCOLAX) packet Take 17 g by mouth daily as needed. For constipation    . potassium chloride SA (K-DUR,KLOR-CON) 20 MEQ tablet Take 20 mEq by mouth daily.    . RABEprazole (ACIPHEX) 20 MG tablet Take 20 mg by mouth daily.    . ranolazine (RANEXA) 500 MG 12 hr tablet Take 1 tablet (500 mg total) by mouth 2 (two) times daily. 60 tablet 11  . SYNTHROID 150 MCG tablet Take 1 tablet by mouth daily.  5  . traMADol (ULTRAM) 50 MG tablet Take 50 mg by mouth every 8 (eight) hours as needed (PAIN).    . Vitamin D, Ergocalciferol, (DRISDOL) 50000 UNITS CAPS Take 50,000 Units by mouth every 7 (seven) days.     No current facility-administered medications for this visit.    Allergies:   Clonidine derivatives; Lisinopril; Losartan; Penicillins; Plavix ; Quinapril hcl; Repaglinide; Toprol xl ; Trovan ; Atorvastatin; Clarithromycin; Hydromorphone hcl; Morphine and related; Oxycodone-acetaminophen; Paroxetine hcl; Pioglitazone; Valsartan; Candesartan cilexetil; Clopidogrel bisulfate; Ezetimibe; Hydromorphone hcl; Metoclopramide hcl; Oxycodone-acetaminophen; Paroxetine; Quinapril hcl; and Vancomycin   Social History: Social History   Social History  . Marital Status: Married    Spouse Name: N/A  . Number of Children: N/A  . Years of Education: N/A   Occupational History  . Not on file.   Social History Main Topics  . Smoking status: Current Every Day Smoker -- 1.50 packs/day    Types: Cigarettes  . Smokeless tobacco: Not on file  . Alcohol Use: No  . Drug Use: No  . Sexual Activity: Not Currently   Other Topics Concern  . Not on file   Social History Narrative    Family History: Family History  Problem Relation Age of Onset  . Anesthesia problems Mother   . Anesthesia  problems Sister   . Neuropathy Father   . Heart attack Father     Cause of death  . Neuropathy Sister   . Diabetes Sister   . Heart Problems Sister   . Diabetes Maternal Grandfather   . Lupus Sister     Review of Systems: All other systems reviewed and are otherwise negative except as noted above.   Physical Exam: VS:  BP 142/90 mmHg  Pulse 58  Ht  (1.549 m)  Wt 188 lb 6.4 oz (85.458 kg)  BMI 35.62 kg/m2 , BMI Body mass index is 35.62 kg/(m^2). Wt Readings from Last 3 Encounters:  05/26/15 188 lb 6.4 oz (85.458 kg)  03/30/15  187 lb (84.823 kg)  02/22/15 189 lb 9.6 oz (86.002 kg)    GEN- The patient is elderly, obese appearing, alert and oriented x 3 today, strong tobacco smell HEENT: normocephalic, atraumatic; sclera clear, conjunctiva pink; hearing intact; oropharynx clear; neck supple  Lungs- Clear to ausculation bilaterally, normal work of breathing.  No wheezes, rales, rhonchi Heart- Regular rate and rhythm, no murmurs, rubs or gallops  GI- obese, non-tender, non-distended, bowel sounds present  Extremities- no clubbing, cyanosis, or edema  MS- no significant deformity or atrophy Skin- warm and dry, no rash or lesion  Psych- euthymic mood, full affect Neuro- strength and sensation are intact   EKG:  EKG is ordered today.  Recent Labs: 03/30/2015: BUN 16; Creat 0.80; Hemoglobin 15.5*; Magnesium 1.8; Platelets 210; Potassium 4.2; Sodium 135    Other studies Reviewed: Additional studies/ records that were reviewed today include: Dr Lubertha Basque office notes  Assessment and Plan: 1. Palpitations Symptomatically improved Continue Ziac  2. Chest pain Typical and atypical features. She is clear this pain is different from when she had her MI previously [employer] as per Dr Lubertha Basque recommendations  3.  Tobacco abuse Trying to quit  4.  HTN Stable No change required today    Current medicines are reviewed at length with the patient today.   The  patient does not have concerns regarding her medicines.  The following changes were made today:  none  Labs/ tests ordered today include: none    Disposition:   Follow up with PCP as scheduled, Dr Ladona Ridgel 6 months   Signed, Gypsy Balsam, NP 05/26/2015 12:02 PM   [facility] [address] [place] [address] [phone] (office) [phone] (fax)

## 2015-06-29 ENCOUNTER — Telehealth (HOSPITAL_COMMUNITY): Payer: Self-pay | Admitting: *Deleted

## 2015-06-29 NOTE — Telephone Encounter (Signed)
Patient given detailed instructions per Myocardial Perfusion Study Information Sheet for the test on 07/01/15 at 9:15. Patient notified to arrive 15 minutes early and that it is imperative to arrive on time for appointment to keep from having the test rescheduled.  If you need to cancel or reschedule your appointment, please call the office within 24 hours of your appointment. Failure to do so may result in a cancellation of your appointment, and a $50 no show fee. Patient verbalized understanding.Daneil Dolin

## 2015-07-01 ENCOUNTER — Ambulatory Visit (HOSPITAL_COMMUNITY): Payer: Medicaid Other | Attending: Cardiovascular Disease

## 2015-07-01 DIAGNOSIS — R002 Palpitations: Secondary | ICD-10-CM | POA: Insufficient documentation

## 2015-07-01 DIAGNOSIS — R9439 Abnormal result of other cardiovascular function study: Secondary | ICD-10-CM | POA: Diagnosis not present

## 2015-07-01 DIAGNOSIS — I10 Essential (primary) hypertension: Secondary | ICD-10-CM | POA: Insufficient documentation

## 2015-07-01 DIAGNOSIS — R079 Chest pain, unspecified: Secondary | ICD-10-CM | POA: Diagnosis present

## 2015-07-01 LAB — MYOCARDIAL PERFUSION IMAGING
LV dias vol: 120 mL
LV sys vol: 52 mL
Peak HR: 74 {beats}/min
RATE: 0.1
Rest HR: 55 {beats}/min
SDS: 6
SRS: 10
SSS: 16
TID: 0.96

## 2015-07-01 MED ORDER — AMINOPHYLLINE 25 MG/ML IV SOLN
150.0000 mg | Freq: Once | INTRAVENOUS | Status: AC
  Administered 2015-07-01: 150 mg via INTRAVENOUS

## 2015-07-01 MED ORDER — TECHNETIUM TC 99M SESTAMIBI GENERIC - CARDIOLITE
32.1000 | Freq: Once | INTRAVENOUS | Status: AC | PRN
  Administered 2015-07-01: 32.1 via INTRAVENOUS

## 2015-07-01 MED ORDER — TECHNETIUM TC 99M SESTAMIBI GENERIC - CARDIOLITE
10.2000 | Freq: Once | INTRAVENOUS | Status: AC | PRN
  Administered 2015-07-01: 10 via INTRAVENOUS

## 2015-07-01 MED ORDER — REGADENOSON 0.4 MG/5ML IV SOLN
0.4000 mg | Freq: Once | INTRAVENOUS | Status: AC
  Administered 2015-07-01: 0.4 mg via INTRAVENOUS

## 2015-07-22 ENCOUNTER — Ambulatory Visit (INDEPENDENT_AMBULATORY_CARE_PROVIDER_SITE_OTHER): Payer: Medicaid Other | Admitting: Nurse Practitioner

## 2015-07-22 ENCOUNTER — Other Ambulatory Visit: Payer: Self-pay | Admitting: Nurse Practitioner

## 2015-07-22 ENCOUNTER — Encounter: Payer: Self-pay | Admitting: *Deleted

## 2015-07-22 ENCOUNTER — Encounter: Payer: Self-pay | Admitting: Nurse Practitioner

## 2015-07-22 ENCOUNTER — Telehealth: Payer: Self-pay | Admitting: *Deleted

## 2015-07-22 ENCOUNTER — Other Ambulatory Visit (HOSPITAL_COMMUNITY)
Admission: RE | Admit: 2015-07-22 | Discharge: 2015-07-22 | Disposition: A | Discharge status: Home / Self Care | Payer: Medicaid Other | Source: Ambulatory Visit | Attending: Nurse Practitioner | Admitting: Nurse Practitioner

## 2015-07-22 VITALS — BP 158/80 | HR 58 | Ht 61.0 in | Wt 189.4 lb

## 2015-07-22 DIAGNOSIS — I10 Essential (primary) hypertension: Secondary | ICD-10-CM

## 2015-07-22 DIAGNOSIS — Z720 Tobacco use: Secondary | ICD-10-CM | POA: Diagnosis not present

## 2015-07-22 DIAGNOSIS — I209 Angina pectoris, unspecified: Secondary | ICD-10-CM

## 2015-07-22 DIAGNOSIS — R002 Palpitations: Secondary | ICD-10-CM

## 2015-07-22 DIAGNOSIS — R079 Chest pain, unspecified: Secondary | ICD-10-CM | POA: Insufficient documentation

## 2015-07-22 LAB — TROPONIN I: Troponin I: <0.03 ng/mL (ref <0.031)

## 2015-07-22 NOTE — Addendum Note (Signed)
Addended by: Tonita Phoenix on: 07/22/2015 01:16 PM   Modules accepted: Orders

## 2015-07-22 NOTE — Addendum Note (Signed)
Addended by: Tonita Phoenix on: 07/22/2015 01:17 PM   Modules accepted: Orders

## 2015-07-22 NOTE — Addendum Note (Signed)
Addended by: BOWDEN, ROBIN K on: 07/22/2015 01:16 PM   Modules accepted: Orders  

## 2015-07-22 NOTE — Addendum Note (Signed)
Addended by: Tonita Phoenix on: 07/22/2015 01:14 PM   Modules accepted: Orders

## 2015-07-22 NOTE — Addendum Note (Signed)
Addended by: Tonita Phoenix on: 07/22/2015 12:23 PM   Modules accepted: Orders

## 2015-07-22 NOTE — Patient Instructions (Addendum)
Medication Instructions:   STAR TAKING RENEXA  500 MG ONCE A DAY   If you need a refill on your cardiac medications before your next appointment, please call your pharmacy.  Labwork:  TROPONIN...RETURN 08/22/15 FOR LABS BMET CBC PT/INR   Testing/Procedures:  SEE LETTER FOR CATH 08/26/15   Follow-Up: 6 WEEKS WITH DR Ladona Ridgel   Any Other Special Instructions Will Be Listed Below (If Applicable).

## 2015-07-22 NOTE — Progress Notes (Addendum)
Electrophysiology Office Note Date: 07/22/2015  ID:  Courtney Barker, DOB 26-Jun-1955, MRN [id number]  PCP: Martyn Malay, MD Electrophysiologist: Ladona Ridgel  CC: follow up on chest pain  Courtney Barker is a 60 y.o. female seen today for Dr Ladona Ridgel. She is seen for follow up for chest pain and abnormal myoview. She has been evaluated by GI with no cause for chest pain found..  Since last being seen in our clinic, the patient reports doing reasonably well.  She has persistent chest pain that is not associated with exertion and not relived with rest. She thinks this is more likely GI related. She is unwilling to pursue further ischemic evaluation until her son is back in [place] in march. Recent myoview was intermediate risk. She denies palpitations, dyspnea, PND, orthopnea, nausea, vomiting, dizziness, syncope, edema, weight gain, or early satiety.  She is trying to quit smoking.   Past Medical History  Diagnosis Date  . Coronary artery disease     s/p BMS x3 RCA '02; repeat cath in '02 and '05 were without significant ISR and normal LV function; pt reported unremarkable nuclear myoview 2011/12  . Hypertension   . Asthma   . Diabetes mellitus   . Sleep apnea     NO STUDY DONE .[name].   . Hypothyroidism   . GERD (gastroesophageal reflux disease)   . Neuromuscular disorder (HCC)   . Headache(784.0)   . Arthritis   . Depression   . Fibromyalgia   . Anxiety   . Hyperlipidemia     statin intolerant, to be followed by lipid clinic  . Tobacco abuse   . Cervical spondylosis with myelopathy     s/p C5-6 and C6-7 Anterior cervical discectomy/decompression; C5-6 and C6-7 interbody arthrodesis with local morcellized autograft bone and Actifuse bone graft extender; insertion of interbody prosthesis at C5-6 and C6-7 (Zimmer peek interbody prosthesis); anterior cervical plating from C5-6 and C6-7 with globus titanium plate 07/20/61   Past Surgical History  Procedure Laterality Date  . Coronary angioplasty     s/p BMS x3 RCA '02  . Tonsillectomy    . Colon surgery      POLUPS REMOVED DURING COLONOSCOPY  . Colon surgery  2007     RESECTION OF BOWEL    . Tubal ligation  1981  . Abdominal hysterectomy  2000  . 2 level anterior cervicectomy fusion and plating  08/23/11  . Anterior cervical decomp/discectomy fusion  08/23/2011    Procedure: ANTERIOR CERVICAL DECOMPRESSION/DISCECTOMY FUSION 2 LEVELS;  Surgeon: Cristi Loron, MD;  Location: [facility];  Service: Neurosurgery;  Laterality: N/A;  Anterior Cervical Five-Six/Six-Seven Decompression with Fusion with Interbody Prothesis, Plating, and Bonegraft    Current Outpatient Prescriptions  Medication Sig Dispense Refill  . acetaminophen (TYLENOL) 500 MG tablet Take 500 mg by mouth every 6 (six) hours as needed (pain).    [name] albuterol (PROVENTIL HFA;VENTOLIN HFA) 108 (90 BASE) MCG/ACT inhaler Inhale 2 puffs into the lungs every 6 (six) hours as needed for wheezing or shortness of breath.    [name] aspirin 325 MG tablet Take 325 mg by mouth daily.    . bisacodyl (DULCOLAX) 5 MG EC tablet Take 5 mg by mouth daily as needed. constipation    . bisoprolol-hydrochlorothiazide (ZIAC) 5-6.25 MG tablet Take 2 tablets by mouth 2 (two) times daily.    . fish oil-omega-3 fatty acids 1000 MG capsule Take 1 g by mouth 2 (two) times daily.     . fluticasone (FLONASE)  50 MCG/ACT nasal spray Place 2 sprays into the nose daily.    . furosemide (LASIX) 40 MG tablet Take 20 mg by mouth daily.     [name] LANTUS 100 UNIT/ML injection Inject 75 Units into the skin at bedtime.   5  . magnesium oxide (MAG-OX) 400 MG tablet Take 400 mg by mouth daily.    . metFORMIN (GLUCOPHAGE-XR) 500 MG 24 hr tablet Take 1,000 mg by mouth 2 (two) times daily.    . nitroGLYCERIN (NITROSTAT) 0.4 MG SL tablet Place 0.4 mg under the tongue every 5 (five) minutes x 3 doses as needed. Chest pain    . NOVOLOG 100 UNIT/ML injection Inject 15 Units into the skin as directed. INJECT 15 UNITS INTO THE SKIN WITH  BREAKFAST, INJECT 20 UNITS INTO THE SKIN AT LUNCH TIME, INJECT 20 UNITS INTO THE SKIN WITH SUPPER. ANY TIME YOUR BG ARE GREATER THAN 150, INJECT INTO THE SKIN AS DIRECTED PER SLIDING SCALE  5  . polyethylene glycol (MIRALAX / GLYCOLAX) packet Take 17 g by mouth daily as needed. For constipation    . potassium chloride SA (K-DUR,KLOR-CON) 20 MEQ tablet Take 20 mEq by mouth daily.    . RABEprazole (ACIPHEX) 20 MG tablet Take 20 mg by mouth daily.    [name] SYNTHROID 150 MCG tablet Take 1 tablet by mouth daily.  5  . traMADol (ULTRAM) 50 MG tablet Take 50 mg by mouth every 8 (eight) hours as needed (PAIN).    . Vitamin D, Ergocalciferol, (DRISDOL) 50000 UNITS CAPS Take 50,000 Units by mouth every 7 (seven) days.    . ranolazine (RANEXA) 500 MG 12 hr tablet Take 1 tablet (500 mg total) by mouth 2 (two) times daily. (Patient not taking: Reported on 07/22/2015) 60 tablet 11   No current facility-administered medications for this visit.    Allergies:   Clonidine derivatives; Lisinopril; Losartan; Metoprolol; Penicillins; Plavix ; Quinapril hcl; Repaglinide; Toprol xl ; Trovan ; Atorvastatin; Clarithromycin; Hydromorphone hcl; Morphine and related; Oxycodone-acetaminophen; Paroxetine hcl; Pioglitazone; Valsartan; Candesartan cilexetil; Clopidogrel bisulfate; Ezetimibe; Hydromorphone hcl; Metoclopramide hcl; Oxycodone-acetaminophen; Paroxetine; Quinapril hcl; and Vancomycin   Social History: Social History   Social History  . Marital Status: Married    Spouse Name: N/A  . Number of Children: N/A  . Years of Education: N/A   Occupational History  . Not on file.   Social History Main Topics  . Smoking status: Current Every Day Smoker -- 1.50 packs/day    Types: Cigarettes  . Smokeless tobacco: Never Used  . Alcohol Use: No  . Drug Use: No  . Sexual Activity: Not Currently   Other Topics Concern  . Not on file   Social History Narrative    Family History: Family History  Problem Relation Age  of Onset  . Anesthesia problems Mother   . Anesthesia problems Sister   . Neuropathy Father   . Heart attack Father     Cause of death  . Neuropathy Sister   . Diabetes Sister   . Heart Problems Sister   . Diabetes Maternal Grandfather   . Lupus Sister     Review of Systems: All other systems reviewed and are otherwise negative except as noted above.   Physical Exam: VS:  BP 158/80 mmHg  Pulse 58  Ht 5\' 1"  (1.549 m)  Wt 189 lb 6.4 oz (85.911 kg)  BMI 35.81 kg/m2 , BMI Body mass index is 35.81 kg/(m^2). Wt Readings from Last 3 Encounters:  07/22/15 189 lb  6.4 oz (85.911 kg)  05/26/15 188 lb 6.4 oz (85.458 kg)  03/30/15 187 lb (84.823 kg)    GEN- The patient is elderly, obese appearing, alert and oriented x 3 today, strong tobacco smell HEENT: normocephalic, atraumatic; sclera clear, conjunctiva pink; hearing intact; oropharynx clear; neck supple  Lungs- Clear to ausculation bilaterally, normal work of breathing.  No wheezes, rales, rhonchi Heart- Regular rate and rhythm, no murmurs, rubs or gallops  GI- obese, non-tender, non-distended, bowel sounds present  Extremities- no clubbing, cyanosis, or edema  MS- no significant deformity or atrophy Skin- warm and dry, no rash or lesion  Psych- euthymic mood, full affect Neuro- strength and sensation are intact   Recent Labs: 03/30/2015: BUN 16; Creat 0.80; Hemoglobin 15.5*; Magnesium 1.8; Platelets 210; Potassium 4.2; Sodium 135    Other studies Reviewed: Additional studies/ records that were reviewed today include: Dr Lubertha Basque office notes  Assessment and Plan: 1. Chest pain Myoview abnormal Risks, benefits to cardiac catheterization discussed with the patient today who wishes to proceed Will schedule at next available time - she is unwilling to schedule until her son is back in [place] in March. Discussed ER precautions today Continue ASA/BB Start Ranexa Troponin today - if elevated, will need to proceed with cath  (discussed with patient and she understands)  2.  Tobacco abuse Making improvements Complete cessation advised  3.  HTN Slightly above goal today  She will continue to monitor at home  4. Palpitations Symptomatically improved Continue Ziac  Current medicines are reviewed at length with the patient today.   The patient does not have concerns regarding her medicines.  The following changes were made today:  none  Labs/ tests ordered today include: pre-procedure labs    Disposition:   Follow up with Dr Ladona Ridgel following cath    Signed, Gypsy Balsam, NP 07/22/2015 11:42 AM   [facility] [address] [place] [address] [phone] (office) [phone] (fax)

## 2015-07-22 NOTE — Telephone Encounter (Signed)
-----   Message from Marily Lente, NP sent at 07/22/2015  3:22 PM EST ----- Please notify patient of negative troponin

## 2015-07-22 NOTE — Addendum Note (Signed)
Addended by: Tonita Phoenix on: 07/22/2015 01:11 PM   Modules accepted: Orders

## 2015-08-22 ENCOUNTER — Other Ambulatory Visit: Payer: Self-pay | Admitting: Nurse Practitioner

## 2015-08-22 ENCOUNTER — Other Ambulatory Visit: Payer: Self-pay | Admitting: *Deleted

## 2015-08-22 ENCOUNTER — Other Ambulatory Visit (INDEPENDENT_AMBULATORY_CARE_PROVIDER_SITE_OTHER): Payer: Medicaid Other | Admitting: *Deleted

## 2015-08-22 DIAGNOSIS — Z720 Tobacco use: Secondary | ICD-10-CM

## 2015-08-22 DIAGNOSIS — I209 Angina pectoris, unspecified: Secondary | ICD-10-CM | POA: Diagnosis not present

## 2015-08-22 DIAGNOSIS — R002 Palpitations: Secondary | ICD-10-CM

## 2015-08-22 DIAGNOSIS — I10 Essential (primary) hypertension: Secondary | ICD-10-CM

## 2015-08-22 LAB — BASIC METABOLIC PANEL
BUN: 12 mg/dL (ref 7–25)
CO2: 26 mmol/L (ref 20–31)
Calcium: 9.4 mg/dL (ref 8.6–10.4)
Chloride: 100 mmol/L (ref 98–110)
Creat: 0.81 mg/dL (ref 0.50–1.05)
Glucose, Bld: 164 mg/dL — ABNORMAL HIGH (ref 65–99)
Potassium: 4.2 mmol/L (ref 3.5–5.3)
Sodium: 139 mmol/L (ref 135–146)

## 2015-08-22 LAB — CBC
HCT: 42.9 % (ref 36.0–46.0)
Hemoglobin: 15.5 g/dL — ABNORMAL HIGH (ref 12.0–15.0)
MCH: 34.6 pg — ABNORMAL HIGH (ref 26.0–34.0)
MCHC: 36.1 g/dL — ABNORMAL HIGH (ref 30.0–36.0)
MCV: 95.8 fL (ref 78.0–100.0)
MPV: 10.5 fL (ref 8.6–12.4)
Platelets: 207 10*3/uL (ref 150–400)
RBC: 4.48 MIL/uL (ref 3.87–5.11)
RDW: 13 % (ref 11.5–15.5)
WBC: 9 10*3/uL (ref 4.0–10.5)

## 2015-08-22 LAB — PROTIME-INR
INR: 0.99 (ref <1.50)
Prothrombin Time: 13.2 seconds (ref 11.6–15.2)

## 2015-08-22 MED ORDER — NITROGLYCERIN 0.4 MG SL SUBL: 0.4000 mg | SUBLINGUAL_TABLET | SUBLINGUAL | Status: DC | PRN

## 2015-08-22 NOTE — Addendum Note (Signed)
Addended by: Tonita Phoenix on: 08/22/2015 10:01 AM   Modules accepted: Orders

## 2015-08-25 ENCOUNTER — Telehealth: Payer: Self-pay | Admitting: *Deleted

## 2015-08-25 NOTE — Telephone Encounter (Signed)
-----   Message from Marily Lente, NP sent at 08/22/2015  7:22 PM EST ----- Stable pre-cath labs

## 2015-08-26 ENCOUNTER — Encounter (HOSPITAL_COMMUNITY)
Admission: RE | Disposition: A | Discharge status: Home / Self Care | Payer: Self-pay | Source: Ambulatory Visit | Attending: Cardiology

## 2015-08-26 ENCOUNTER — Encounter (HOSPITAL_COMMUNITY): Payer: Self-pay | Admitting: Cardiology

## 2015-08-26 ENCOUNTER — Ambulatory Visit (HOSPITAL_COMMUNITY)
Admission: RE | Admit: 2015-08-26 | Discharge: 2015-08-27 | Disposition: A | Discharge status: Home / Self Care | Payer: Medicaid Other | Source: Ambulatory Visit | Attending: Cardiology | Admitting: Cardiology

## 2015-08-26 DIAGNOSIS — I2510 Atherosclerotic heart disease of native coronary artery without angina pectoris: Secondary | ICD-10-CM | POA: Diagnosis present

## 2015-08-26 DIAGNOSIS — G4730 Sleep apnea, unspecified: Secondary | ICD-10-CM | POA: Diagnosis not present

## 2015-08-26 DIAGNOSIS — Z955 Presence of coronary angioplasty implant and graft: Secondary | ICD-10-CM

## 2015-08-26 DIAGNOSIS — Z7982 Long term (current) use of aspirin: Secondary | ICD-10-CM | POA: Diagnosis not present

## 2015-08-26 DIAGNOSIS — K219 Gastro-esophageal reflux disease without esophagitis: Secondary | ICD-10-CM | POA: Diagnosis not present

## 2015-08-26 DIAGNOSIS — E119 Type 2 diabetes mellitus without complications: Secondary | ICD-10-CM | POA: Insufficient documentation

## 2015-08-26 DIAGNOSIS — E039 Hypothyroidism, unspecified: Secondary | ICD-10-CM | POA: Insufficient documentation

## 2015-08-26 DIAGNOSIS — Z9861 Coronary angioplasty status: Secondary | ICD-10-CM

## 2015-08-26 DIAGNOSIS — I2582 Chronic total occlusion of coronary artery: Secondary | ICD-10-CM | POA: Insufficient documentation

## 2015-08-26 DIAGNOSIS — E785 Hyperlipidemia, unspecified: Secondary | ICD-10-CM | POA: Diagnosis not present

## 2015-08-26 DIAGNOSIS — Z8249 Family history of ischemic heart disease and other diseases of the circulatory system: Secondary | ICD-10-CM | POA: Insufficient documentation

## 2015-08-26 DIAGNOSIS — F329 Major depressive disorder, single episode, unspecified: Secondary | ICD-10-CM | POA: Insufficient documentation

## 2015-08-26 DIAGNOSIS — F411 Generalized anxiety disorder: Secondary | ICD-10-CM | POA: Diagnosis not present

## 2015-08-26 DIAGNOSIS — I209 Angina pectoris, unspecified: Secondary | ICD-10-CM | POA: Diagnosis present

## 2015-08-26 DIAGNOSIS — M797 Fibromyalgia: Secondary | ICD-10-CM | POA: Insufficient documentation

## 2015-08-26 DIAGNOSIS — I25119 Atherosclerotic heart disease of native coronary artery with unspecified angina pectoris: Secondary | ICD-10-CM | POA: Insufficient documentation

## 2015-08-26 DIAGNOSIS — Z6836 Body mass index (BMI) 36.0-36.9, adult: Secondary | ICD-10-CM | POA: Insufficient documentation

## 2015-08-26 DIAGNOSIS — F17210 Nicotine dependence, cigarettes, uncomplicated: Secondary | ICD-10-CM | POA: Diagnosis not present

## 2015-08-26 DIAGNOSIS — M1990 Unspecified osteoarthritis, unspecified site: Secondary | ICD-10-CM | POA: Diagnosis not present

## 2015-08-26 DIAGNOSIS — J45909 Unspecified asthma, uncomplicated: Secondary | ICD-10-CM | POA: Diagnosis not present

## 2015-08-26 DIAGNOSIS — Z9114 Patient's other noncompliance with medication regimen: Secondary | ICD-10-CM | POA: Insufficient documentation

## 2015-08-26 DIAGNOSIS — E669 Obesity, unspecified: Secondary | ICD-10-CM | POA: Diagnosis present

## 2015-08-26 DIAGNOSIS — G709 Myoneural disorder, unspecified: Secondary | ICD-10-CM | POA: Insufficient documentation

## 2015-08-26 DIAGNOSIS — Z789 Other specified health status: Secondary | ICD-10-CM

## 2015-08-26 DIAGNOSIS — Z7984 Long term (current) use of oral hypoglycemic drugs: Secondary | ICD-10-CM | POA: Insufficient documentation

## 2015-08-26 DIAGNOSIS — M4712 Other spondylosis with myelopathy, cervical region: Secondary | ICD-10-CM | POA: Insufficient documentation

## 2015-08-26 DIAGNOSIS — Z794 Long term (current) use of insulin: Secondary | ICD-10-CM | POA: Insufficient documentation

## 2015-08-26 DIAGNOSIS — I10 Essential (primary) hypertension: Secondary | ICD-10-CM | POA: Diagnosis present

## 2015-08-26 DIAGNOSIS — E66811 Obesity, class 1: Secondary | ICD-10-CM | POA: Diagnosis present

## 2015-08-26 DIAGNOSIS — R931 Abnormal findings on diagnostic imaging of heart and coronary circulation: Secondary | ICD-10-CM | POA: Diagnosis present

## 2015-08-26 DIAGNOSIS — E1159 Type 2 diabetes mellitus with other circulatory complications: Secondary | ICD-10-CM

## 2015-08-26 HISTORY — DX: Cardiac murmur, unspecified: R01.1

## 2015-08-26 HISTORY — DX: Chronic obstructive pulmonary disease, unspecified: J44.9

## 2015-08-26 HISTORY — DX: Low back pain, unspecified: M54.50

## 2015-08-26 HISTORY — DX: Nontoxic single thyroid nodule: E04.1

## 2015-08-26 HISTORY — DX: Type 2 diabetes mellitus without complications: E11.9

## 2015-08-26 HISTORY — PX: CARDIAC CATHETERIZATION: SHX172

## 2015-08-26 HISTORY — DX: Low back pain: M54.5

## 2015-08-26 HISTORY — DX: Acute myocardial infarction, unspecified: I21.9

## 2015-08-26 HISTORY — DX: Other chronic pain: G89.29

## 2015-08-26 HISTORY — DX: Proteinuria, unspecified: R80.9

## 2015-08-26 LAB — GLUCOSE, CAPILLARY
Glucose-Capillary: 243 mg/dL — ABNORMAL HIGH (ref 65–99)
Glucose-Capillary: 225 mg/dL — ABNORMAL HIGH (ref 65–99)
Glucose-Capillary: 192 mg/dL — ABNORMAL HIGH (ref 65–99)
Glucose-Capillary: 283 mg/dL — ABNORMAL HIGH (ref 65–99)

## 2015-08-26 LAB — POCT ACTIVATED CLOTTING TIME
Activated Clotting Time: 219 seconds
Activated Clotting Time: 374 seconds

## 2015-08-26 SURGERY — CORONARY STENT INTERVENTION

## 2015-08-26 MED ORDER — HEPARIN (PORCINE) IN NACL 2-0.9 UNIT/ML-% IJ SOLN
INTRAMUSCULAR | Status: AC
  Filled 2015-08-26: qty 1500

## 2015-08-26 MED ORDER — FENTANYL CITRATE (PF) 100 MCG/2ML IJ SOLN
INTRAMUSCULAR | Status: AC
  Filled 2015-08-26: qty 2

## 2015-08-26 MED ORDER — MIDAZOLAM HCL 2 MG/2ML IJ SOLN
INTRAMUSCULAR | Status: DC | PRN
  Administered 2015-08-26 (×2): 1 mg via INTRAVENOUS
  Administered 2015-08-26: 2 mg via INTRAVENOUS

## 2015-08-26 MED ORDER — SODIUM CHLORIDE 0.9 % WEIGHT BASED INFUSION: 1.0000 mL/kg/h | INTRAVENOUS | Status: DC

## 2015-08-26 MED ORDER — ACETAMINOPHEN 325 MG PO TABS
650.0000 mg | ORAL_TABLET | ORAL | Status: DC | PRN
  Administered 2015-08-26 – 2015-08-27 (×2): 650 mg via ORAL
  Filled 2015-08-26 (×2): qty 2

## 2015-08-26 MED ORDER — BISOPROLOL-HYDROCHLOROTHIAZIDE 5-6.25 MG PO TABS
2.0000 | ORAL_TABLET | Freq: Two times a day (BID) | ORAL | Status: DC
  Filled 2015-08-26 (×3): qty 2

## 2015-08-26 MED ORDER — FUROSEMIDE 20 MG PO TABS
20.0000 mg | ORAL_TABLET | Freq: Every day | ORAL | Status: DC
  Administered 2015-08-26 – 2015-08-27 (×2): 20 mg via ORAL
  Filled 2015-08-26 (×2): qty 1

## 2015-08-26 MED ORDER — PRASUGREL HCL 10 MG PO TABS
ORAL_TABLET | ORAL | Status: AC
  Filled 2015-08-26: qty 1

## 2015-08-26 MED ORDER — SODIUM CHLORIDE 0.9% FLUSH: 3.0000 mL | INTRAVENOUS | Status: DC | PRN

## 2015-08-26 MED ORDER — SODIUM CHLORIDE 0.9 % WEIGHT BASED INFUSION
3.0000 mL/kg/h | INTRAVENOUS | Status: AC
  Administered 2015-08-26: 16:00:00 3 mL/kg/h via INTRAVENOUS

## 2015-08-26 MED ORDER — MIDAZOLAM HCL 2 MG/2ML IJ SOLN
INTRAMUSCULAR | Status: AC
  Filled 2015-08-26: qty 2

## 2015-08-26 MED ORDER — IOHEXOL 350 MG/ML SOLN
INTRAVENOUS | Status: DC | PRN
  Administered 2015-08-26: 225 mL via INTRA_ARTERIAL

## 2015-08-26 MED ORDER — ONDANSETRON HCL 4 MG/2ML IJ SOLN: 4.0000 mg | Freq: Four times a day (QID) | INTRAMUSCULAR | Status: DC | PRN

## 2015-08-26 MED ORDER — ALBUTEROL SULFATE (2.5 MG/3ML) 0.083% IN NEBU: 3.0000 mL | INHALATION_SOLUTION | Freq: Four times a day (QID) | RESPIRATORY_TRACT | Status: DC | PRN

## 2015-08-26 MED ORDER — VITAMIN D (ERGOCALCIFEROL) 1.25 MG (50000 UNIT) PO CAPS
50000.0000 [IU] | ORAL_CAPSULE | ORAL | Status: DC
Start: 2015-08-31 — End: 2015-08-27

## 2015-08-26 MED ORDER — SODIUM CHLORIDE 0.9 % IV SOLN: 250.0000 mL | INTRAVENOUS | Status: DC | PRN

## 2015-08-26 MED ORDER — VERAPAMIL HCL 2.5 MG/ML IV SOLN
INTRAVENOUS | Status: DC | PRN
  Administered 2015-08-26: 13:00:00 via INTRA_ARTERIAL

## 2015-08-26 MED ORDER — INSULIN GLARGINE 100 UNIT/ML ~~LOC~~ SOLN
75.0000 [IU] | Freq: Every day | SUBCUTANEOUS | Status: DC
  Administered 2015-08-26: 23:00:00 75 [IU] via SUBCUTANEOUS
  Filled 2015-08-26 (×2): qty 0.75

## 2015-08-26 MED ORDER — INSULIN ASPART 100 UNIT/ML ~~LOC~~ SOLN
20.0000 [IU] | Freq: Two times a day (BID) | SUBCUTANEOUS | Status: DC
  Administered 2015-08-26: 20 [IU] via SUBCUTANEOUS

## 2015-08-26 MED ORDER — HEPARIN SODIUM (PORCINE) 1000 UNIT/ML IJ SOLN
INTRAMUSCULAR | Status: AC
  Filled 2015-08-26: qty 1

## 2015-08-26 MED ORDER — NITROGLYCERIN 1 MG/10 ML FOR IR/CATH LAB
INTRA_ARTERIAL | Status: DC | PRN
  Administered 2015-08-26: 200 ug via INTRACORONARY

## 2015-08-26 MED ORDER — VERAPAMIL HCL 2.5 MG/ML IV SOLN
INTRAVENOUS | Status: AC
  Filled 2015-08-26: qty 2

## 2015-08-26 MED ORDER — SODIUM CHLORIDE 0.9% FLUSH: 3.0000 mL | Freq: Two times a day (BID) | INTRAVENOUS | Status: DC

## 2015-08-26 MED ORDER — LORAZEPAM 0.5 MG PO TABS
0.5000 mg | ORAL_TABLET | ORAL | Status: DC | PRN
  Administered 2015-08-26 (×2): 0.5 mg via ORAL
  Filled 2015-08-26 (×2): qty 1

## 2015-08-26 MED ORDER — ASPIRIN 81 MG PO CHEW
81.0000 mg | CHEWABLE_TABLET | ORAL | Status: DC
Start: 2015-08-27 — End: 2015-08-26

## 2015-08-26 MED ORDER — NITROGLYCERIN 1 MG/10 ML FOR IR/CATH LAB
INTRA_ARTERIAL | Status: AC
  Filled 2015-08-26: qty 10

## 2015-08-26 MED ORDER — POLYETHYLENE GLYCOL 3350 17 G PO PACK: 17.0000 g | PACK | Freq: Every day | ORAL | Status: DC | PRN

## 2015-08-26 MED ORDER — INSULIN ASPART 100 UNIT/ML ~~LOC~~ SOLN
15.0000 [IU] | Freq: Every day | SUBCUTANEOUS | Status: DC
  Administered 2015-08-27: 07:00:00 15 [IU] via SUBCUTANEOUS

## 2015-08-26 MED ORDER — ASPIRIN 81 MG PO CHEW
81.0000 mg | CHEWABLE_TABLET | Freq: Every day | ORAL | Status: DC
  Administered 2015-08-27: 10:00:00 81 mg via ORAL
  Filled 2015-08-26: qty 1

## 2015-08-26 MED ORDER — SODIUM CHLORIDE 0.9 % WEIGHT BASED INFUSION
3.0000 mL/kg/h | INTRAVENOUS | Status: DC
  Administered 2015-08-26: 3 mL/kg/h via INTRAVENOUS

## 2015-08-26 MED ORDER — HEPARIN SODIUM (PORCINE) 1000 UNIT/ML IJ SOLN
INTRAMUSCULAR | Status: DC | PRN
  Administered 2015-08-26: 4500 [IU] via INTRAVENOUS
  Administered 2015-08-26 (×2): 4000 [IU] via INTRAVENOUS

## 2015-08-26 MED ORDER — BISACODYL 5 MG PO TBEC
5.0000 mg | DELAYED_RELEASE_TABLET | Freq: Every day | ORAL | Status: DC | PRN
Start: 2015-08-26 — End: 2015-08-27
  Filled 2015-08-26: qty 1

## 2015-08-26 MED ORDER — OMEGA-3-ACID ETHYL ESTERS 1 G PO CAPS
1.0000 g | ORAL_CAPSULE | Freq: Two times a day (BID) | ORAL | Status: DC
  Filled 2015-08-26 (×2): qty 1

## 2015-08-26 MED ORDER — LEVOTHYROXINE SODIUM 75 MCG PO TABS
150.0000 ug | ORAL_TABLET | Freq: Every day | ORAL | Status: DC
Start: 2015-08-27 — End: 2015-08-27
  Administered 2015-08-27: 150 ug via ORAL
  Filled 2015-08-26: qty 2

## 2015-08-26 MED ORDER — PRASUGREL HCL 10 MG PO TABS
ORAL_TABLET | ORAL | Status: DC | PRN
  Administered 2015-08-26: 60 mg via ORAL

## 2015-08-26 MED ORDER — HEPARIN (PORCINE) IN NACL 2-0.9 UNIT/ML-% IJ SOLN
INTRAMUSCULAR | Status: DC | PRN
  Administered 2015-08-26: 1500 mL

## 2015-08-26 MED ORDER — PRASUGREL HCL 10 MG PO TABS: 10.0000 mg | ORAL_TABLET | Freq: Every day | ORAL | Status: DC

## 2015-08-26 MED ORDER — POTASSIUM CHLORIDE CRYS ER 20 MEQ PO TBCR
20.0000 meq | EXTENDED_RELEASE_TABLET | Freq: Every day | ORAL | Status: DC
  Administered 2015-08-26: 17:00:00 10 meq via ORAL
  Filled 2015-08-26 (×2): qty 1

## 2015-08-26 MED ORDER — TRAMADOL HCL 50 MG PO TABS: 50.0000 mg | ORAL_TABLET | Freq: Three times a day (TID) | ORAL | Status: DC | PRN

## 2015-08-26 MED ORDER — LIDOCAINE HCL (PF) 1 % IJ SOLN
INTRAMUSCULAR | Status: DC | PRN
  Administered 2015-08-26: 2 mL via SUBCUTANEOUS

## 2015-08-26 MED ORDER — LIDOCAINE HCL (PF) 1 % IJ SOLN
INTRAMUSCULAR | Status: AC
  Filled 2015-08-26: qty 30

## 2015-08-26 MED ORDER — FENTANYL CITRATE (PF) 100 MCG/2ML IJ SOLN
INTRAMUSCULAR | Status: DC | PRN
  Administered 2015-08-26 (×2): 25 ug via INTRAVENOUS
  Administered 2015-08-26: 50 ug via INTRAVENOUS

## 2015-08-26 MED ORDER — PANTOPRAZOLE SODIUM 40 MG PO TBEC
40.0000 mg | DELAYED_RELEASE_TABLET | Freq: Every day | ORAL | Status: DC
  Filled 2015-08-26 (×2): qty 1

## 2015-08-26 MED ORDER — MAGNESIUM OXIDE 400 (241.3 MG) MG PO TABS
200.0000 mg | ORAL_TABLET | Freq: Two times a day (BID) | ORAL | Status: DC
Start: 2015-08-26 — End: 2015-08-27
  Filled 2015-08-26 (×2): qty 1

## 2015-08-26 MED ORDER — OMEGA-3 FATTY ACIDS 1000 MG PO CAPS: 1.0000 g | ORAL_CAPSULE | Freq: Two times a day (BID) | ORAL | Status: DC

## 2015-08-26 SURGICAL SUPPLY — 24 items
BALLN EUPHORA RX 2.0X15 (BALLOONS) ×4
BALLN EUPHORA RX 2.25X15 (BALLOONS) ×4
BALLN EUPHORA RX 2.5X15 (BALLOONS) ×4
BALLN ~~LOC~~ EMERGE MR 2.75X15 (BALLOONS) ×4
BALLOON EUPHORA RX 2.0X15 (BALLOONS) ×2 IMPLANT
BALLOON EUPHORA RX 2.25X15 (BALLOONS) ×2 IMPLANT
BALLOON EUPHORA RX 2.5X15 (BALLOONS) ×2 IMPLANT
BALLOON ~~LOC~~ EMERGE MR 2.75X15 (BALLOONS) ×2 IMPLANT
CATH INFINITI 5 FR JL3.5 (CATHETERS) ×4 IMPLANT
CATH INFINITI 5FR ANG PIGTAIL (CATHETERS) ×4 IMPLANT
CATH INFINITI 5FR JL4 (CATHETERS) ×4 IMPLANT
CATH INFINITI JR4 5F (CATHETERS) ×4 IMPLANT
DEVICE RAD COMP TR BAND LRG (VASCULAR PRODUCTS) ×4 IMPLANT
GLIDESHEATH SLEND SS 6F .021 (SHEATH) ×4 IMPLANT
GUIDE CATH RUNWAY 6FR CLS4 (CATHETERS) ×4 IMPLANT
KIT ENCORE 26 ADVANTAGE (KITS) ×4 IMPLANT
KIT HEART LEFT (KITS) ×4 IMPLANT
PACK CARDIAC CATHETERIZATION (CUSTOM PROCEDURE TRAY) ×4 IMPLANT
STENT SYNERGY DES 2.50X8 (Permanent Stent) ×4 IMPLANT
STENT SYNERGY DES 2.5X24 (Permanent Stent) ×4 IMPLANT
TRANSDUCER W/STOPCOCK (MISCELLANEOUS) ×4 IMPLANT
TUBING CIL FLEX 10 FLL-RA (TUBING) ×4 IMPLANT
WIRE ASAHI PROWATER 180CM (WIRE) ×4 IMPLANT
WIRE SAFE-T 1.5MM-J .035X260CM (WIRE) ×4 IMPLANT

## 2015-08-26 NOTE — H&P (Signed)
Physician History and Physical    Patient ID: Courtney Barker MRN: [id number] DOB/AGE: November 19, 1955 60 y.o. Admit date: 08/26/2015  Primary Care Physician: Martyn Malay, MD Primary Cardiologist Lewayne Bunting MD  HPI: 60 yo WF with known history of CAD. Presents with symptoms of chest pain. Was evaluated by GI with no cause for chest pain seen. She had a Myoview stress test that showed a large partially reversible defect in the apex and inferolateral wall. EF 56%.  She is s/p BMS stenting of the RCA in 2002. Repeat caths in 2002 and 2005 here showed patent stents. She had cardiac cath in July 2015 at [facility] that showed CTO of the RCA and was treated medically. There was moderate disease noted in the third OM and mild disease in the LAD. She is experiencing chest pain now with exertion and at rest. Unable to tolerate Ranexa due to bowel issues. She has DM, HTN, hyperlipidemia, and ongoing tobacco abuse.   Review of systems complete and found to be negative unless listed above  Past Medical History  Diagnosis Date  . Coronary artery disease     s/p BMS x3 RCA '02; repeat cath in '02 and '05 were without significant ISR and normal LV function; pt reported unremarkable nuclear myoview 2011/12  . Hypertension   . Asthma   . Diabetes mellitus   . Sleep apnea     NO STUDY DONE .[name].   . Hypothyroidism   . GERD (gastroesophageal reflux disease)   . Neuromuscular disorder (HCC)   . Headache(784.0)   . Arthritis   . Depression   . Fibromyalgia   . Anxiety   . Hyperlipidemia     statin intolerant, to be followed by lipid clinic  . Tobacco abuse   . Cervical spondylosis with myelopathy     s/p C5-6 and C6-7 Anterior cervical discectomy/decompression; C5-6 and C6-7 interbody arthrodesis with local morcellized autograft bone and Actifuse bone graft extender; insertion of interbody prosthesis at C5-6 and C6-7 (Zimmer peek interbody prosthesis); anterior cervical plating from C5-6 and C6-7 with  globus titanium plate 4/0/98    Family History  Problem Relation Age of Onset  . Anesthesia problems Mother   . Anesthesia problems Sister   . Neuropathy Father   . Heart attack Father     Cause of death  . Neuropathy Sister   . Diabetes Sister   . Heart Problems Sister   . Diabetes Maternal Grandfather   . Lupus Sister     Social History   Social History  . Marital Status: Married    Spouse Name: N/A  . Number of Children: N/A  . Years of Education: N/A   Occupational History  . Not on file.   Social History Main Topics  . Smoking status: Current Every Day Smoker -- 1.50 packs/day    Types: Cigarettes  . Smokeless tobacco: Never Used  . Alcohol Use: No  . Drug Use: No  . Sexual Activity: Not Currently   Other Topics Concern  . Not on file   Social History Narrative    Past Surgical History  Procedure Laterality Date  . Coronary angioplasty      s/p BMS x3 RCA '02  . Tonsillectomy    . Colon surgery      POLUPS REMOVED DURING COLONOSCOPY  . Colon surgery  2007     RESECTION OF BOWEL    . Tubal ligation  1981  . Abdominal hysterectomy  2000  . 2 level anterior  cervicectomy fusion and plating  08/23/11  . Anterior cervical decomp/discectomy fusion  08/23/2011    Procedure: ANTERIOR CERVICAL DECOMPRESSION/DISCECTOMY FUSION 2 LEVELS;  Surgeon: Cristi Loron, MD;  Location: [facility];  Service: Neurosurgery;  Laterality: N/A;  Anterior Cervical Five-Six/Six-Seven Decompression with Fusion with Interbody Prothesis, Plating, and Bonegraft     Prescriptions prior to admission  Medication Sig Dispense Refill Last Dose  . acetaminophen (TYLENOL) 500 MG tablet Take 1,000 mg by mouth every 6 (six) hours as needed (pain).    Taking  . albuterol (PROVENTIL HFA;VENTOLIN HFA) 108 (90 BASE) MCG/ACT inhaler Inhale 2 puffs into the lungs every 6 (six) hours as needed for wheezing or shortness of breath.   Past Month at Unknown time  . aspirin 325 MG tablet Take 325 mg by  mouth daily.   08/26/2015 at 0700  . bisacodyl (DULCOLAX) 5 MG EC tablet Take 5 mg by mouth daily as needed. constipation   Past Week at Unknown time  . bisoprolol-hydrochlorothiazide (ZIAC) 5-6.25 MG tablet Take 2 tablets by mouth 2 (two) times daily.   08/26/2015 at 0700  . fish oil-omega-3 fatty acids 1000 MG capsule Take 1 g by mouth 2 (two) times daily.    Past Month at Unknown time  . fluticasone (FLONASE) 50 MCG/ACT nasal spray Place 2 sprays into the nose daily as needed for allergies.    Past Month at Unknown time  . furosemide (LASIX) 40 MG tablet Take 20 mg by mouth daily.    08/25/2015 at Unknown time  . LANTUS 100 UNIT/ML injection Inject 75 Units into the skin at bedtime.   5 08/25/2015 at Unknown time  . magnesium oxide (MAG-OX) 400 MG tablet Take 200 mg by mouth 2 (two) times daily.    08/24/2015  . metFORMIN (GLUCOPHAGE-XR) 500 MG 24 hr tablet Take 1,000 mg by mouth 2 (two) times daily.   08/24/2015  . Neomycin-Bacitracin-Polymyxin (TRIPLE ANTIBIOTIC EX) Apply 1 application topically as needed (for wound).     . nitroGLYCERIN (NITROSTAT) 0.4 MG SL tablet Place 1 tablet (0.4 mg total) under the tongue every 5 (five) minutes x 3 doses as needed. 25 tablet 5   . NOVOLOG 100 UNIT/ML injection Inject 15-20 Units into the skin 3 (three) times daily. INJECT 15 UNITS INTO THE SKIN WITH BREAKFAST, INJECT 20 UNITS INTO THE SKIN AT LUNCH TIME, INJECT 20 UNITS INTO THE SKIN WITH SUPPER. ANY TIME YOUR BG ARE GREATER THAN 150, INJECT INTO THE SKIN AS DIRECTED PER SLIDING SCALE  5 08/25/2015 at Unknown time  . polyethylene glycol (MIRALAX / GLYCOLAX) packet Take 17 g by mouth daily as needed. For constipation   Past Week at Unknown time  . potassium chloride SA (K-DUR,KLOR-CON) 20 MEQ tablet Take 20 mEq by mouth daily.   08/25/2015 at Unknown time  . RABEprazole (ACIPHEX) 20 MG tablet Take 20 mg by mouth daily.   08/25/2015 at Unknown time  . SYNTHROID 150 MCG tablet Take 150 mcg by mouth daily.   5 08/26/2015 at  0700  . Vitamin D, Ergocalciferol, (DRISDOL) 50000 UNITS CAPS Take 50,000 Units by mouth every 7 (seven) days.   08/24/2015  . ranolazine (RANEXA) 500 MG 12 hr tablet Take 1 tablet (500 mg total) by mouth 2 (two) times daily. (Patient not taking: Reported on 07/22/2015) 60 tablet 11 Not Taking  . traMADol (ULTRAM) 50 MG tablet Take 50 mg by mouth every 8 (eight) hours as needed (PAIN).   Unknown at Unknown time  Physical Exam: Blood pressure 155/86, pulse 57, temperature 97.5 F (36.4 C), temperature source Oral, resp. rate 18, height  (1.549 m), weight 87.544 kg (193 lb), SpO2 100 %. Current Weight  08/26/15 87.544 kg (193 lb)  07/22/15 85.911 kg (189 lb 6.4 oz)  05/26/15 85.458 kg (188 lb 6.4 oz)    GENERAL:  Well appearing, obese WF in NAD. HEENT:  PERRL, EOMI, sclera are clear. Oropharynx is clear. NECK:  No jugular venous distention, carotid upstroke brisk and symmetric, no bruits, no thyromegaly or adenopathy LUNGS:  Clear to auscultation bilaterally CHEST:  Unremarkable HEART:  RRR,  PMI not displaced or sustained,S1 and S2 within normal limits, no S3, no S4: no clicks, no rubs, no murmurs ABD: Obese,  Soft, nontender. BS +, no masses or bruits. No hepatomegaly, no splenomegaly EXT:  2 + pulses throughout, no edema, no cyanosis no clubbing SKIN:  Warm and dry.  No rashes NEURO:  Alert and oriented x 3. Cranial nerves II through XII intact. PSYCH:  Cognitively intact    Labs:   Lab Results  Component Value Date   WBC 9.0 08/22/2015   HGB 15.5* 08/22/2015   HCT 42.9 08/22/2015   MCV 95.8 08/22/2015   PLT 207 08/22/2015    Recent Labs Lab 08/22/15 1009  NA 139  K 4.2  CL 100  CO2 26  BUN 12  CREATININE 0.81  CALCIUM 9.4  GLUCOSE 164*   Lab Results  Component Value Date   CKMB 5.2* 08/24/2011   TROPONINI <0.03 07/22/2015   TROPONINI <0.30 08/24/2011   Lab Results  Component Value Date   CHOL 287* 09/27/2006   CHOL 316* 04/08/2006   Lab Results    Component Value Date   HDL 38.6* 09/27/2006   HDL 33.3* 04/08/2006   No results found for: [facility] Lab Results  Component Value Date   TRIG 234* 09/27/2006   TRIG 242* 04/08/2006   Lab Results  Component Value Date   CHOLHDL 7.4 CALC 09/27/2006   CHOLHDL 9.5 CALC 04/08/2006   Lab Results  Component Value Date   LDLDIRECT 221.2 09/27/2006   LDLDIRECT 227.9 04/08/2006    Lab Results  Component Value Date   PROBNP 165.0* 01/24/2007   Lab Results  Component Value Date   TSH 2.818 01/24/2007   Lab Results  Component Value Date   HGBA1C 9.4* 09/27/2006    Radiology: No results found.  EKG: today NSR with LVH, lateral repolarization abnormality.  ASSESSMENT AND PLAN:  1. CAD with exertional angina class 3. Intermediate risk Myoview. Remote history of stenting of the RCA with BMS. Known occlusion of RCA by cath 2015. Plan repeat cath with possible PCI. The procedure and risks were reviewed including but not limited to death, myocardial infarction, stroke, arrythmias, bleeding, transfusion, emergency surgery, dye allergy, or renal dysfunction. The patient voices understanding and is agreeable to proceed.. 2. HTN 3. DM type 2 4. Tobacco abuse. 5. Hyperlipdemia.   Signed: Ethanael Veith [place], MDFACC  08/26/2015, 10:57 AM

## 2015-08-26 NOTE — Progress Notes (Signed)
Report received from [facility] that pt is very anxious and has received Ativan with very little effect.  Pt states she feels like every time she tries to close her eyes, she is awakened by a feeling of panic like she can't breathe. She says in the past when she's needed Ativan it knocks her out and this time "it hasn't done anything to me."  O2 sat is 97% on RA and lungs are clear. States she remembers this feeling when she took Plavix and the symptoms got profoundly worse when they treated it with prednisone.  Pt states it must be the "blood thinner" and that "they've got to understand that I just can't take those medicines."  Pt anxious talkative and tearful at times. Received 60 mg Effient today.  Spent most of the past 4 hours in and out of her room, listening to patient and reassuring her.  Explained several times the dangers of her stents reoccluding and causing heart attack and possibly death if she doesn't take her antiplatelet as prescribed.   Pt has 26 listed medication intolerances.  Discussed with her that many patients with fibromyalgia often had intolerances to medicines that sound unusual and can be quite scary (pt states she wasn't sure she had fibromyalgia but it is in her history).  RN reinforced that she has excellent physicians that will work with her to find the safest tolerable solution.  Encouraged strongly to give them a chance and NOT stop taking the medicine.  Similar effects are commonly seen in pt's that are started on Brilinta and that caffeine often helps decrease the symptoms.  Pt like this idea because she had a headache from little caffeine today so she drank coffee.  Went through each med ordered for tonight and pt refused most of them except insulin.  Pt is beginning to have pain in rt great toe and has history of gout.  Agreed to take Tylenol now and states if it doesn't help she will take the Tramadol as she's had that in the past without problems.  Walked to bathroom with RN  standing by, no dizziness but mild SOB with exertion.  Sitting on bed eating snack, very much improved in mood with less anxiety. Calm and cooperative.  Pt states headache gone now but toe still sore.  When pt dozed off, HR was down to 47 and when walking to BR up to 63.  Bisaprolol held at this time, advised Ardine Eng in report that med not given.  Introduced pt to Ashley.  Pt calm and voices appreciation of care.

## 2015-08-26 NOTE — Interval H&P Note (Signed)
History and Physical Interval Note:  08/26/2015 1:06 PM  Courtney Barker  has presented today for surgery, with the diagnosis of abnormal stress test  The various methods of treatment have been discussed with the patient and family. After consideration of risks, benefits and other options for treatment, the patient has consented to  Procedure(s): Left Heart Cath and Coronary Angiography (N/A) as a surgical intervention .  The patient's history has been reviewed, patient examined, no change in status, stable for surgery.  I have reviewed the patient's chart and labs.  Questions were answered to the patient's satisfaction.   Cath Lab Visit (complete for each Cath Lab visit)  Clinical Evaluation Leading to the Procedure:   ACS: No.  Non-ACS:    Anginal Classification: CCS III  Anti-ischemic medical therapy: Minimal Therapy (1 class of medications)  Non-Invasive Test Results: Intermediate-risk stress test findings: cardiac mortality 1-3%/year  Prior CABG: No previous CABG       Theron Arista [facility] 08/26/2015 1:06 PM

## 2015-08-27 DIAGNOSIS — Z9861 Coronary angioplasty status: Secondary | ICD-10-CM

## 2015-08-27 DIAGNOSIS — Z955 Presence of coronary angioplasty implant and graft: Secondary | ICD-10-CM | POA: Diagnosis not present

## 2015-08-27 DIAGNOSIS — R931 Abnormal findings on diagnostic imaging of heart and coronary circulation: Secondary | ICD-10-CM | POA: Diagnosis present

## 2015-08-27 DIAGNOSIS — Z789 Other specified health status: Secondary | ICD-10-CM

## 2015-08-27 DIAGNOSIS — I25119 Atherosclerotic heart disease of native coronary artery with unspecified angina pectoris: Secondary | ICD-10-CM | POA: Diagnosis not present

## 2015-08-27 DIAGNOSIS — Z889 Allergy status to unspecified drugs, medicaments and biological substances status: Secondary | ICD-10-CM

## 2015-08-27 DIAGNOSIS — I10 Essential (primary) hypertension: Secondary | ICD-10-CM

## 2015-08-27 DIAGNOSIS — E669 Obesity, unspecified: Secondary | ICD-10-CM | POA: Diagnosis present

## 2015-08-27 DIAGNOSIS — I209 Angina pectoris, unspecified: Secondary | ICD-10-CM

## 2015-08-27 DIAGNOSIS — I2510 Atherosclerotic heart disease of native coronary artery without angina pectoris: Secondary | ICD-10-CM | POA: Diagnosis not present

## 2015-08-27 DIAGNOSIS — I2582 Chronic total occlusion of coronary artery: Secondary | ICD-10-CM | POA: Diagnosis not present

## 2015-08-27 DIAGNOSIS — F411 Generalized anxiety disorder: Secondary | ICD-10-CM | POA: Diagnosis not present

## 2015-08-27 LAB — GLUCOSE, CAPILLARY: Glucose-Capillary: 154 mg/dL — ABNORMAL HIGH (ref 65–99)

## 2015-08-27 LAB — BASIC METABOLIC PANEL
Anion gap: 10 (ref 5–15)
BUN: 9 mg/dL (ref 6–20)
CO2: 29 mmol/L (ref 22–32)
Calcium: 9.2 mg/dL (ref 8.9–10.3)
Chloride: 103 mmol/L (ref 101–111)
Creatinine, Ser: 0.67 mg/dL (ref 0.44–1.00)
GFR calc Af Amer: >60 mL/min (ref >60)
GFR calc non Af Amer: >60 mL/min (ref >60)
Glucose, Bld: 138 mg/dL — ABNORMAL HIGH (ref 65–99)
Potassium: 3.7 mmol/L (ref 3.5–5.1)
Sodium: 142 mmol/L (ref 135–145)

## 2015-08-27 LAB — CBC
HCT: 39.9 % (ref 36.0–46.0)
Hemoglobin: 13.9 g/dL (ref 12.0–15.0)
MCH: 32.9 pg (ref 26.0–34.0)
MCHC: 34.8 g/dL (ref 30.0–36.0)
MCV: 94.5 fL (ref 78.0–100.0)
Platelets: 176 10*3/uL (ref 150–400)
RBC: 4.22 MIL/uL (ref 3.87–5.11)
RDW: 12.5 % (ref 11.5–15.5)
WBC: 8.2 10*3/uL (ref 4.0–10.5)

## 2015-08-27 MED ORDER — PRASUGREL HCL 10 MG PO TABS
ORAL_TABLET | ORAL | Status: AC
  Filled 2015-08-27: qty 1

## 2015-08-27 MED ORDER — PRASUGREL HCL 10 MG PO TABS
5.0000 mg | ORAL_TABLET | Freq: Every day | ORAL | Status: DC
  Administered 2015-08-27: 10:00:00 5 mg via ORAL

## 2015-08-27 MED ORDER — ANGIOPLASTY BOOK
Freq: Once | Status: DC
  Filled 2015-08-27: qty 1

## 2015-08-27 MED ORDER — PRASUGREL HCL 5 MG PO TABS: 5.0000 mg | ORAL_TABLET | Freq: Every day | ORAL | Status: DC

## 2015-08-27 MED ORDER — METFORMIN HCL ER 500 MG PO TB24: 1000.0000 mg | ORAL_TABLET | Freq: Two times a day (BID) | ORAL | Status: DC

## 2015-08-27 MED ORDER — OMEGA-3-ACID ETHYL ESTERS 1 G PO CAPS: 1.0000 g | ORAL_CAPSULE | Freq: Two times a day (BID) | ORAL | Status: DC

## 2015-08-27 MED ORDER — BISOPROLOL-HYDROCHLOROTHIAZIDE 5-6.25 MG PO TABS
1.0000 | ORAL_TABLET | Freq: Two times a day (BID) | ORAL | Status: DC
  Administered 2015-08-27: 10:00:00 1 via ORAL

## 2015-08-27 MED ORDER — BISOPROLOL-HYDROCHLOROTHIAZIDE 5-6.25 MG PO TABS: 1.0000 | ORAL_TABLET | Freq: Two times a day (BID) | ORAL | Status: DC

## 2015-08-27 NOTE — Progress Notes (Signed)
Primary Cardiologist: Dr. Lewayne Bunting  Subjective:  Pt upset this am about her medications - she states that she can't take Plavix because of bad reaction in past with prior stent. She took one dose of Effient and felt anxious and dyspneic. No chest pain. Tearfull about situation.  Objective:  Vital Signs in the last 24 hours: Temp:  [97.4 F (36.3 C)-98 F (36.7 C)] 97.6 F (36.4 C) (03/11 0728) Pulse Rate:  [48-72] 72 (03/11 0728) Resp:  [0-44] 16 (03/11 0728) BP: (113-181)/(49-88) 113/56 mmHg (03/11 0728) SpO2:  [90 %-100 %] 94 % (03/11 0728) Weight:  [191 lb 9.3 oz (86.9 kg)-193 lb (87.544 kg)] 191 lb 9.3 oz (86.9 kg) (03/11 0433)  Intake/Output from previous day:  Intake/Output Summary (Last 24 hours) at 08/27/15 0821 Last data filed at 08/27/15 0802  Gross per 24 hour  Intake 2007.5 ml  Output    775 ml  Net 1232.5 ml    Physical Exam: General appearance: alert, cooperative, moderately obese and anxious, upset, tearful Declined exam   Rate: 50-75  Rhythm: normal sinus rhythm and sinus bradycardia  Lab Results:  Recent Labs  08/27/15 0449  WBC 8.2  HGB 13.9  PLT 176    Recent Labs  08/27/15 0449  NA 142  K 3.7  CL 103  CO2 29  GLUCOSE 138*  BUN 9  CREATININE 0.67    Scheduled Meds: . angioplasty book   Does not apply Once  . aspirin  81 mg Oral Daily  . bisoprolol-hydrochlorothiazide  2 tablet Oral BID  . furosemide  20 mg Oral Daily  . insulin aspart  15 Units Subcutaneous Q breakfast  . insulin aspart  20 Units Subcutaneous BID AC  . insulin glargine  75 Units Subcutaneous QHS  . levothyroxine  150 mcg Oral QAC breakfast  . magnesium oxide  200 mg Oral BID  . omega-3 acid ethyl esters  1 g Oral BID  . pantoprazole  40 mg Oral Daily  . potassium chloride SA  20 mEq Oral Daily  . prasugrel  10 mg Oral Daily  . sodium chloride flush  3 mL Intravenous Q12H  . [START ON 08/31/2015] Vitamin D (Ergocalciferol)  50,000 Units Oral Q Wed    Continuous Infusions:  PRN Meds:.sodium chloride, acetaminophen, albuterol, bisacodyl, LORazepam, ondansetron (ZOFRAN) IV, polyethylene glycol, sodium chloride flush, traMADol  Cardiac catheterization 08/26/2015:  Ost RCA to Mid RCA lesion, 100% stenosed. The lesion was previously treated with a bare metal stent greater than two years ago.  1st Diag lesion, 40% stenosed.  Ost LAD to Prox LAD lesion, 20% stenosed.  Prox Cx lesion, 30% stenosed.  3rd Mrg lesion, 99% stenosed. Post intervention, there is a 0% residual stenosis.  1. Severe 2 vessel obstructive CAD  - CTO of the RCA at prior stented segment with left to right collaterals.  - New severe lesion in a large OM3 2. Successful stenting of OM3 with DES x 2.   Plan: Continue DAPT for one year with ASA 81 mg daily and Effient. Risk factor modification. Would continue medical therapy for CTO of RCA. Could consider for CTO PCI but vessel was small in 2002 so long term patency would not be favorable. Anticipate DC in am.   Assessment/Plan:  60 yo WF with known history of CAD. Presents with symptoms of chest pain. Was evaluated by GI with no cause for chest pain seen. She had a Myoview stress test that showed a large partially reversible defect  in the apex and inferolateral wall. EF 56%.  She is s/p BMS stenting of the RCA in 2002. Repeat caths in 2002 and 2005 here showed patent stents. She had cardiac cath in July 2015 at [facility] that showed CTO of the RCA and was treated medically. There was moderate disease noted in the third OM and mild disease in the LAD. She is experiencing chest pain now with exertion and at rest. Unable to tolerate Ranexa due to bowel issues. She has DM, HTN, hyperlipidemia, and ongoing tobacco abuse. Myoview as an OP revealed a large inferior lateral defect. She was admitted for cath and had an OM3 DES x2 placed. Unfortunately she now refuses to take antiplatelet agents except ASA.   Principal  Problem:   Angina pectoris (HCC) Active Problems:   Essential hypertension   Coronary atherosclerosis   Occlusion of coronary artery (HCC)   Type 2 diabetes mellitus (HCC)   PLAN: MD to see. ? Home on ASA 325 mg. F/U with Dr Lubertha Basque APP in 1-2 weeks.   Corine Shelter PA-C 08/27/2015, 8:21 AM [phone]  Attending note:  Patient seen and examined. Reviewed records and discussed the case with Mr. Lenn Cal. Ms. Weaks is a patient of Dr. Ladona Ridgel referred for cardiac catheterization after recent abnormal Myoview with known history of CAD and multiple medication intolerances. Cardiac catheterization was performed by Dr. [place] with finding of an occluded RCA  At site of prior BMS placement, otherwise nonobstructive disease with the exception of a 99% third OM stenosis. This vessel was treated with DES 2. I have reviewed subsequent chart notes. Patient intermittently very upset about her medications and concerns about adverse side effects. She reports previous adverse reaction to Plavix, took Effient with concerns about feeling of anxiety and shortness of breath. As an outpatient she has only been on aspirin and Ziac.  On examination this morning she was tearful having discussed her concerns with several different staff members, although more calm and conversant with me. Systolic blood pressure initially elevated however has come down, heart rate in the 50s to 70s in sinus rhythm. Lungs clear without labored breathing. Cardiac exam with RRR no gallop. Lab work reviewed, creatinine 0.7, hemoglobin 13.9. Personally reviewed her tracing from today which shows sinus bradycardia with left anterior fascicular block and LVH.  Discussed situation with patient. She is now status post DES 2 to the third OM per Dr. Swaziland, and dual antiplatelet therapy regimen is very important at this time to prevent stent thrombosis. We discussed several options for medical therapy available, and she has agreed to compromise  and take Effient at 5 mg daily for now along with aspirin 325 mg daily. My preference would be Effient 10 mg daily and aspirin 81 mg daily, and perhaps she will be able to move toward this regimen if she tolerates things well. We are reducing the dose of her Ziac given bradycardia. She will need a follow-up visit with Dr. Ladona Ridgel or APP in 7 days.  Jonelle Sidle, M.D., F.A.C.C.

## 2015-08-27 NOTE — Progress Notes (Signed)
CARDIAC REHAB PHASE I   Pt sitting on edge of bed, tearful, upset. Pt declines ambulation at this time. Pt also refuses detailed education at this time, states "I just need to get out of here." Pt verbalized intended non-compliance with cholesterol and anti-platelet medication, cardiology PA aware. Discussed stent card, tobacco cessation (gave pt fake cigarette, pt states she has no plan to quit at this time), activity progression and phase 2 cardiac rehab. Left exercise guidelines and heart healthy/diabetes diet handouts as well for pt to review. Pt agrees to phase 2 cardiac rehab referral, will send to [facility] per pt request. Pt in bed, call bell within reach. RN notified of pt status.   [phone] Joylene Grapes, RN, BSN 08/27/2015 8:35 AM

## 2015-08-27 NOTE — Discharge Summary (Signed)
Patient ID: Courtney Barker,  MRN: [id number], DOB/AGE: 1956-05-31 60 y.o.  Admit date: 08/26/2015 Discharge date: 08/27/2015  Primary Care Provider: Martyn Malay, MD Primary Cardiologist: Dr Ladona Ridgel  Discharge Diagnoses Principal Problem:   Angina pectoris [facility]) Active Problems:   CAD -S/P remote RCA PCI, now occluded s/p OM3 DES  08/26/15   Abnormal nuclear cardiac imaging test   Generalized anxiety disorder   Essential hypertension   Multiple drug intolerance-> 20   Obesity (BMI 36)    Procedures: Cath OM3 PCI with DES 08/26/15   Hospital Course:  60 yo WF with known history of CAD, seen by Dr Ladona Ridgel as an OP second opinion for chest pain.   She is s/p BMS stenting of the RCA in 2002. Repeat caths in 2002 and 2005 here showed patent stents. She had cardiac cath in July 2015 at [facility] that showed CTO of the RCA and was treated medically. There was moderate disease noted in the third OM and mild disease in the LAD. She is experiencing chest pain now with exertion and at rest. Unable to tolerate Ranexa due to bowel issues. She has DM, HTN, hyperlipidemia, and ongoing tobacco abuse. Myoview as an OP revealed a large inferior lateral defect. She was admitted for cath and had an OM3 DES x2 placed 08/26/15. She has a history of multiple medication intolerances (> 25 medications). She can't tolerate Plavix. She was given one dose of Effient 10 mg post PCI and had a "reaction" ( feeling dread and hypersensitivity) and said she could not take that drug. She was quite upset, anxious and tearful on the morning of the 11 th. Dr Diona Browner convinced her to try Effient 5 mg and a full dose ASA. She is discharged in stable condition and will need f/u in a week.  She was noted to be bradycardic overnight. She is morbidly obese an we suspect she has sleep apnea. Her Ziac was decreased at discharge.   Discharge Vitals:  Blood pressure 113/56, pulse 72, temperature 97.6 F (36.4 C), temperature  source Oral, resp. rate 16, height  (1.549 m), weight 191 lb 9.3 oz (86.9 kg), SpO2 94 %.    Labs: Results for orders placed or performed during the hospital encounter of 08/26/15 (from the past 24 hour(s))  Glucose, capillary     Status: Abnormal   Collection Time: 08/26/15 12:55 PM  Result Value Ref Range   Glucose-Capillary 225 (H) 65 - 99 mg/dL   Comment 1 Notify RN   POCT Activated clotting time     Status: None   Collection Time: 08/26/15  1:49 PM  Result Value Ref Range   Activated Clotting Time 374 seconds  POCT Activated clotting time     Status: None   Collection Time: 08/26/15  2:19 PM  Result Value Ref Range   Activated Clotting Time 219 seconds  Glucose, capillary     Status: Abnormal   Collection Time: 08/26/15  3:25 PM  Result Value Ref Range   Glucose-Capillary 192 (H) 65 - 99 mg/dL  Glucose, capillary     Status: Abnormal   Collection Time: 08/26/15  9:07 PM  Result Value Ref Range   Glucose-Capillary 283 (H) 65 - 99 mg/dL  Basic metabolic panel     Status: Abnormal   Collection Time: 08/27/15  4:49 AM  Result Value Ref Range   Sodium 142 135 - 145 mmol/L   Potassium 3.7 3.5 - 5.1 mmol/L   Chloride 103 101 -  111 mmol/L   CO2 29 22 - 32 mmol/L   Glucose, Bld 138 (H) 65 - 99 mg/dL   BUN 9 6 - 20 mg/dL   Creatinine, Ser 0.34 0.44 - 1.00 mg/dL   Calcium 9.2 8.9 - 03.5 mg/dL   GFR calc non Af Amer >60 >60 mL/min   GFR calc Af Amer >60 >60 mL/min   Anion gap 10 5 - 15  CBC     Status: None   Collection Time: 08/27/15  4:49 AM  Result Value Ref Range   WBC 8.2 4.0 - 10.5 K/uL   RBC 4.22 3.87 - 5.11 MIL/uL   Hemoglobin 13.9 12.0 - 15.0 g/dL   HCT 24.8 18.5 - 90.9 %   MCV 94.5 78.0 - 100.0 fL   MCH 32.9 26.0 - 34.0 pg   MCHC 34.8 30.0 - 36.0 g/dL   RDW 31.1 21.6 - 24.4 %   Platelets 176 150 - 400 K/uL  Glucose, capillary     Status: Abnormal   Collection Time: 08/27/15  6:12 AM  Result Value Ref Range   Glucose-Capillary 154 (H) 65 - 99 mg/dL     Disposition:  Follow-up Information    Follow up with Marily Lente, NP.   Specialty:  Cardiology   Why:  office will contact you   Contact information:   [address] [place] [address] [phone]       Discharge Medications:    Medication List    STOP taking these medications        ranolazine 500 MG 12 hr tablet  Commonly known as:  RANEXA      TAKE these medications        acetaminophen 500 MG tablet  Commonly known as:  TYLENOL  Take 1,000 mg by mouth every 6 (six) hours as needed (pain).     ACIPHEX 20 MG tablet  Generic drug:  RABEprazole  Take 20 mg by mouth daily.     albuterol 108 (90 Base) MCG/ACT inhaler  Commonly known as:  PROVENTIL HFA;VENTOLIN HFA  Inhale 2 puffs into the lungs every 6 (six) hours as needed for wheezing or shortness of breath.     aspirin 325 MG tablet  Take 325 mg by mouth daily.     bisacodyl 5 MG EC tablet  Commonly known as:  DULCOLAX  Take 5 mg by mouth daily as needed. constipation     bisoprolol-hydrochlorothiazide 5-6.25 MG tablet  Commonly known as:  ZIAC  Take 1 tablet by mouth 2 (two) times daily.     fish oil-omega-3 fatty acids 1000 MG capsule  Take 1 g by mouth 2 (two) times daily.     fluticasone 50 MCG/ACT nasal spray  Commonly known as:  FLONASE  Place 2 sprays into the nose daily as needed for allergies.     furosemide 40 MG tablet  Commonly known as:  LASIX  Take 20 mg by mouth daily.     LANTUS 100 UNIT/ML injection  Generic drug:  insulin glargine  Inject 75 Units into the skin at bedtime.     magnesium oxide 400 MG tablet  Commonly known as:  MAG-OX  Take 200 mg by mouth 2 (two) times daily.     metFORMIN 500 MG 24 hr tablet  Commonly known as:  GLUCOPHAGE-XR  Take 2 tablets (1,000 mg total) by mouth 2 (two) times daily.  Start taking on:  08/29/2015     nitroGLYCERIN 0.4 MG SL tablet  Commonly known as:  NITROSTAT  Place 1 tablet (0.4 mg total) under the tongue every 5 (five)  minutes x 3 doses as needed.     NOVOLOG 100 UNIT/ML injection  Generic drug:  insulin aspart  Inject 15-20 Units into the skin 3 (three) times daily. INJECT 15 UNITS INTO THE SKIN WITH BREAKFAST, INJECT 20 UNITS INTO THE SKIN AT LUNCH TIME, INJECT 20 UNITS INTO THE SKIN WITH SUPPER. ANY TIME YOUR BG ARE GREATER THAN 150, INJECT INTO THE SKIN AS DIRECTED PER SLIDING SCALE     omega-3 acid ethyl esters 1 g capsule  Commonly known as:  LOVAZA  Take 1 capsule (1 g total) by mouth 2 (two) times daily.     polyethylene glycol packet  Commonly known as:  MIRALAX / GLYCOLAX  Take 17 g by mouth daily as needed. For constipation     potassium chloride SA 20 MEQ tablet  Commonly known as:  K-DUR,KLOR-CON  Take 20 mEq by mouth daily.     prasugrel 5 MG Tabs tablet  Commonly known as:  EFFIENT  Take 1 tablet (5 mg total) by mouth daily.     SYNTHROID 150 MCG tablet  Generic drug:  levothyroxine  Take 150 mcg by mouth daily.     traMADol 50 MG tablet  Commonly known as:  ULTRAM  Take 50 mg by mouth every 8 (eight) hours as needed (PAIN).     TRIPLE ANTIBIOTIC EX  Apply 1 application topically as needed (for wound).     Vitamin D (Ergocalciferol) 50000 units Caps capsule  Commonly known as:  DRISDOL  Take 50,000 Units by mouth every 7 (seven) days.         Duration of Discharge Encounter: Greater than 30 minutes including physician time.  Jolene Provost PA-C 08/27/2015 10:19 AM

## 2015-08-27 NOTE — Discharge Instructions (Signed)
Coronary Angiogram With Stent, Care After °Refer to this sheet in the next few weeks. These instructions provide you with information about caring for yourself after your procedure. Your health care provider may also give you more specific instructions. Your treatment has been planned according to current medical practices, but problems sometimes occur. Call your health care provider if you have any problems or questions after your procedure. °WHAT TO EXPECT AFTER THE PROCEDURE  °After your procedure, it is typical to have the following: °· Bruising at the catheter insertion site that usually fades within 1-2 weeks. °· Blood collecting in the tissue (hematoma) that may be painful to the touch. It should usually decrease in size and tenderness within 1-2 weeks. °HOME CARE INSTRUCTIONS °· Take medicines only as directed by your health care provider. Blood thinners may be prescribed after your procedure to improve blood flow through the stent. °· You may shower 24-48 hours after the procedure or as directed by your health care provider. Remove the bandage (dressing) and gently wash the catheter insertion site with plain soap and water. Pat the area dry with a clean towel. Do not rub the site, because this may cause bleeding. °· Do not take baths, swim, or use a hot tub until your health care provider approves. °· Check your catheter insertion site every day for redness, swelling, or drainage. °· Do not apply powder or lotion to the site. °· Do not lift over 10 lb (4.5 kg) for 5 days after your procedure or as directed by your health care provider. °· Ask your health care provider when it is okay to: °¨ Return to work or school. °¨ Resume usual physical activities or sports. °¨ Resume sexual activity. °· Eat a heart-healthy diet. This should include plenty of fresh fruits and vegetables. Meat should be lean cuts. Avoid the following types of food: °¨ Food that is high in salt. °¨ Canned or highly processed food. °¨ Food  that is high in saturated fat or sugar. °¨ Fried food. °· Make any other lifestyle changes as recommended by your health care provider. These may include: °¨ Not using any tobacco products, including cigarettes, chewing tobacco, or electronic cigarettes. If you need help quitting, ask your health care provider. °¨ Managing your weight. °¨ Getting regular exercise. °¨ Managing your blood pressure. °¨ Limiting your alcohol intake. °¨ Managing other health problems, such as diabetes. °· If you need an MRI after your heart stent has been placed, be sure to tell the health care provider who orders the MRI that you have a heart stent. °· Keep all follow-up visits as directed by your health care provider. This is important. °SEEK MEDICAL CARE IF: °· You have a fever. °· You have chills. °· You have increased bleeding from the catheter insertion site. Hold pressure on the site. °SEEK IMMEDIATE MEDICAL CARE IF: °· You develop chest pain or shortness of breath, feel faint, or pass out. °· You have unusual pain at the catheter insertion site. °· You have redness, warmth, or swelling at the catheter insertion site. °· You have drainage (other than a small amount of blood on the dressing) from the catheter insertion site. °· The catheter insertion site is bleeding, and the bleeding does not stop after 30 minutes of holding steady pressure on the site. °· You develop bleeding from any other place, such as from your rectum. There may be bright red blood in your urine or stool, or it may appear as black, tarry stool. °  °  This information is not intended to replace advice given to you by your health care provider. Make sure you discuss any questions you have with your health care provider. °  °Document Released: 12/22/2004 Document Revised: 06/25/2014 Document Reviewed: 10/27/2012 °[employer] Interactive Patient Education ©2016 [employer] ° °

## 2015-08-30 ENCOUNTER — Telehealth: Payer: Self-pay | Admitting: Internal Medicine

## 2015-08-30 NOTE — Progress Notes (Signed)
Electrophysiology Office Note Date: 08/31/2015  ID:  Courtney Barker, DOB June 20, 1955, MRN [id number]  PCP: Martyn Malay, MD  Primary Cardiologist: [place]  Electrophysiologist: Ladona Ridgel  CC: follow up on chest pain  Courtney Barker is a 60 y.o. female seen today for Dr Ladona Ridgel. She is seen for follow up for post hospital follow up.  She underwent DES to OM earlier this month. Since discharge, she has done relatively well. Her chest tightness is improved. She continues to smoke but is trying to quit. She has tolerated Effient without bleeding or other complications and is willing to try recommended dose now.  Her palpiattions were worsened with decreased dose of Ziac and she would like to increase dose today to bid. She denies  dyspnea, PND, orthopnea, nausea, vomiting, dizziness, syncope, edema, weight gain, or early satiety.     Past Medical History  Diagnosis Date  . Coronary artery disease     s/p BMS x3 RCA '02; repeat cath in '02 and '05 were without significant ISR and normal LV function; pt reported unremarkable nuclear myoview 2011/12  . Hypertension   . Asthma   . Hypothyroidism   . GERD (gastroesophageal reflux disease)   . Neuromuscular disorder (HCC)   . Depression   . Fibromyalgia   . Anxiety   . Hyperlipidemia     statin intolerant, to be followed by lipid clinic  . Tobacco abuse   . PONV (postoperative nausea and vomiting)   . Heart murmur   . Myocardial infarction [facility]) 12/2013    "[place]"  . COPD (chronic obstructive pulmonary disease) (HCC)   . Sleep apnea     NO STUDY DONE .[name].   . Type II diabetes mellitus (HCC)   . Thyroid nodule   . Headache(784.0)     "2-3 times/week" (08/26/2015)  . Arthritis     "head to toe; neck, hands, arms, knees, feet, ankles" (08/26/2015)  . Cervical spondylosis with myelopathy     s/p C5-6 and C6-7 Anterior cervical discectomy/decompression; C5-6 and C6-7 interbody arthrodesis with local morcellized autograft bone and Actifuse bone  graft extender; insertion of interbody prosthesis at C5-6 and C6-7 (Zimmer peek interbody prosthesis); anterior cervical plating from C5-6 and C6-7 with globus titanium plate 4/0/98  . Chronic lower back pain   . Increased urinary protein excretion    Past Surgical History  Procedure Laterality Date  . Tonsillectomy    . Colonoscopy w/ polypectomy    . Bowel resection  2007   . Tubal ligation  1981  . Multiple tooth extractions  2000  . Anterior cervical decomp/discectomy fusion  08/23/2011    Procedure: ANTERIOR CERVICAL DECOMPRESSION/DISCECTOMY FUSION 2 LEVELS;  Surgeon: Cristi Loron, MD;  Location: [facility];  Service: Neurosurgery;  Laterality: N/A;  Anterior Cervical Five-Six/Six-Seven Decompression with Fusion with Interbody Prothesis, Plating, and Bonegraft  . Coronary angioplasty with stent placement  2002    s/p BMS x3 RCA   . Cardiac catheterization N/A 08/26/2015    Procedure: Coronary Stent Intervention;  Surgeon: Peter M [place]il[place], MD;  Location: [facility] INVASIVE CV LAB;  Service: Cardiovascular;  Laterality: N/A;  OM3 and circ  . Cardiac catheterization  2002; 2005    patent stents  . Cardiac catheterization  12/2013    CTO of the RCA ; moderate disease noted in the third OM and mild disease in the LAD; treated medically  . Back surgery    . Abdominal hysterectomy  2000    "fibroids; took  1 ovary"    Current Outpatient Prescriptions  Medication Sig Dispense Refill  . acetaminophen (TYLENOL) 500 MG tablet Take 1,000 mg by mouth every 6 (six) hours as needed (pain).     [name] albuterol (PROVENTIL HFA;VENTOLIN HFA) 108 (90 BASE) MCG/ACT inhaler Inhale 2 puffs into the lungs every 6 (six) hours as needed for wheezing or shortness of breath.    [name] aspirin 81 MG tablet Take 81 mg by mouth daily.    . bisacodyl (DULCOLAX) 5 MG EC tablet Take 5 mg by mouth daily as needed. constipation    . bisoprolol-hydrochlorothiazide (ZIAC) 5-6.25 MG tablet Take 1 tablet by mouth 2 (two) times daily.     . fish oil-omega-3 fatty acids 1000 MG capsule Take 1 g by mouth 2 (two) times daily.     . fluticasone (FLONASE) 50 MCG/ACT nasal spray Place 2 sprays into the nose daily as needed for allergies.     . furosemide (LASIX) 40 MG tablet Take 20 mg by mouth daily.     [name] LANTUS 100 UNIT/ML injection Inject 75 Units into the skin at bedtime.   5  . magnesium oxide (MAG-OX) 400 MG tablet Take 200 mg by mouth 2 (two) times daily.     . metFORMIN (GLUCOPHAGE-XR) 500 MG 24 hr tablet Take 2 tablets (1,000 mg total) by mouth 2 (two) times daily.    [name] Neomycin-Bacitracin-Polymyxin (TRIPLE ANTIBIOTIC EX) Apply 1 application topically as needed (for wound).    . nitroGLYCERIN (NITROSTAT) 0.4 MG SL tablet Place 1 tablet (0.4 mg total) under the tongue every 5 (five) minutes x 3 doses as needed. 25 tablet 5  . NOVOLOG 100 UNIT/ML injection Inject 15-20 Units into the skin 3 (three) times daily. INJECT 15 UNITS INTO THE SKIN WITH BREAKFAST, INJECT 20 UNITS INTO THE SKIN AT LUNCH TIME, INJECT 20 UNITS INTO THE SKIN WITH SUPPER. ANY TIME YOUR BG ARE GREATER THAN 150, INJECT INTO THE SKIN AS DIRECTED PER SLIDING SCALE  5  . omega-3 acid ethyl esters (LOVAZA) 1 g capsule Take 1 capsule (1 g total) by mouth 2 (two) times daily. 60 capsule 5  . polyethylene glycol (MIRALAX / GLYCOLAX) packet Take 17 g by mouth daily as needed. For constipation    . potassium chloride SA (K-DUR,KLOR-CON) 20 MEQ tablet Take 20 mEq by mouth daily.    . prasugrel (EFFIENT) 5 MG TABS tablet Take 1 tablet (5 mg total) by mouth 2 (two) times daily. 60 tablet 11  . RABEprazole (ACIPHEX) 20 MG tablet Take 20 mg by mouth daily.    [name] SYNTHROID 150 MCG tablet Take 150 mcg by mouth daily.   5  . traMADol (ULTRAM) 50 MG tablet Take 50 mg by mouth every 8 (eight) hours as needed (PAIN).    . Vitamin D, Ergocalciferol, (DRISDOL) 50000 UNITS CAPS Take 50,000 Units by mouth every 7 (seven) days.     No current facility-administered medications for  this visit.    Allergies:   Clonidine derivatives; Lisinopril; Losartan; Metoprolol; Penicillins; Plavix; Quinapril hcl; Repaglinide; Toprol xl; Trovan; Atorvastatin; Clarithromycin; Hydromorphone hcl; Morphine and related; Oxycodone-acetaminophen; Paroxetine hcl; Pioglitazone; Valsartan; Candesartan cilexetil; Clopidogrel bisulfate; Ezetimibe; Hydromorphone hcl; Metoclopramide hcl; Oxycodone-acetaminophen; Paroxetine; Quinapril hcl; and Vancomycin   Social History: Social History   Social History  . Marital Status: Married    Spouse Name: N/A  . Number of Children: N/A  . Years of Education: N/A   Occupational History  . Not on file.   Social History  Main Topics  . Smoking status: Current Every Day Smoker -- 1.00 packs/day for 43 years    Types: Cigarettes  . Smokeless tobacco: Never Used  . Alcohol Use: No  . Drug Use: No  . Sexual Activity: Not Currently   Other Topics Concern  . Not on file   Social History Narrative    Family History: Family History  Problem Relation Age of Onset  . Anesthesia problems Mother   . Anesthesia problems Sister   . Neuropathy Father   . Heart attack Father     Cause of death  . Neuropathy Sister   . Diabetes Sister   . Heart Problems Sister   . Diabetes Maternal Grandfather   . Lupus Sister     Review of Systems: All other systems reviewed and are otherwise negative except as noted above.   Physical Exam: VS:  BP 136/82 mmHg  Pulse 58  Ht 5\' 1"  (1.549 m)  Wt 193 lb (87.544 kg)  BMI 36.49 kg/m2 , BMI Body mass index is 36.49 kg/(m^2). Wt Readings from Last 3 Encounters:  08/31/15 193 lb (87.544 kg)  08/27/15 191 lb 9.3 oz (86.9 kg)  07/22/15 189 lb 6.4 oz (85.911 kg)    GEN- The patient is elderly, obese appearing, alert and oriented x 3 today, strong tobacco smell HEENT: normocephalic, atraumatic; sclera clear, conjunctiva pink; hearing intact; oropharynx clear; neck supple  Lungs- Clear to ausculation bilaterally,  normal work of breathing.  No wheezes, rales, rhonchi Heart- Regular rate and rhythm, no murmurs, rubs or gallops  GI- obese, non-tender, non-distended, bowel sounds present  Extremities- no clubbing, cyanosis, or edema  MS- no significant deformity or atrophy Skin- warm and dry, no rash or lesion  Psych- euthymic mood, full affect Neuro- strength and sensation are intact   Recent Labs: 03/30/2015: Magnesium 1.8 08/27/2015: BUN 9; Creatinine, Ser 0.67; Hemoglobin 13.9; Platelets 176; Potassium 3.7; Sodium 142    Other studies Reviewed: Additional studies/ records that were reviewed today include: Dr Lubertha Basque office notes  Assessment and Plan: 1. CAD S/p DES to [facility] 08/26/15 Continue Effient and ASA Discussed with Dr Eldridge Dace today - Effient 10mg  daily is recommended dose. Pt is willing to try 10mg  daily in divided doses - will order Effient 5mg  twice daily and decrease ASA to 81mg  daily Refer to [place] [facility] She needs to establish with general cardiology and feels like she had a good relationship with Dr Swaziland. She would like to follow with him.   2.  Tobacco abuse Making improvements Complete cessation advised  3.  HTN Stable No change required today  4. Palpitations Worse on decreased dose of Ziac Increase back to bid today   Current medicines are reviewed at length with the patient today.   The patient does not have concerns regarding her medicines.  The following changes were made today:  none  Labs/ tests ordered today include: pre-procedure labs    Disposition:   Follow up with Dr [place]and in 3 months, me in 6 months    Signed, Gypsy Balsam, NP 08/31/2015 12:48 PM   [facility] [address] [place] [address] [phone] (office) [phone] (fax)

## 2015-08-30 NOTE — Telephone Encounter (Signed)
New Message  Pt was called to sched 2 wk post cath f/u w/ Amber- pt wanted to instead speak w/ RN concerning issues she has with her blood thinner. Please call back and discuss.

## 2015-08-30 NOTE — Telephone Encounter (Signed)
Returned patient's call.  Dr Ladona Ridgel says she really needs a primary cardiologist.  She says her Ziac was decreased to 1 twice daily from 2 twice daily.  She is calling today due to increased BP and HR.  Her BP 155/98 HR 60's, says her HR is usually in the 50's.  Took her BP yesterday because she had a HA and it was up.  She wants to know if she should increase her Ziac back to the original dose.  She hs an appointment with Gypsy Balsam, NP tomorrow and will discuss then.  She says that when in the hospital she wasn't moving around as much and her BP and HR were down so the Ziac was reduced.  She feels she needs to back on the original dose of 2 of the 5/6.25mg  tablets twice daily.  I have asked her to discuss with [facility] tomorrow.  I let her know we will see what her BP and HR are also.  She was appreciative of my call and will keep her follow up tomorrow.

## 2015-08-31 ENCOUNTER — Encounter: Payer: Self-pay | Admitting: Nurse Practitioner

## 2015-08-31 ENCOUNTER — Ambulatory Visit (INDEPENDENT_AMBULATORY_CARE_PROVIDER_SITE_OTHER): Payer: Medicaid Other | Admitting: Nurse Practitioner

## 2015-08-31 ENCOUNTER — Telehealth: Payer: Self-pay | Admitting: Nurse Practitioner

## 2015-08-31 VITALS — BP 136/82 | HR 58 | Ht 61.0 in | Wt 193.0 lb

## 2015-08-31 DIAGNOSIS — R002 Palpitations: Secondary | ICD-10-CM

## 2015-08-31 DIAGNOSIS — Z720 Tobacco use: Secondary | ICD-10-CM

## 2015-08-31 DIAGNOSIS — I10 Essential (primary) hypertension: Secondary | ICD-10-CM | POA: Diagnosis not present

## 2015-08-31 DIAGNOSIS — I2510 Atherosclerotic heart disease of native coronary artery without angina pectoris: Secondary | ICD-10-CM | POA: Diagnosis not present

## 2015-08-31 MED ORDER — PRASUGREL HCL 5 MG PO TABS: 5.0000 mg | ORAL_TABLET | Freq: Two times a day (BID) | ORAL | Status: DC

## 2015-08-31 NOTE — Telephone Encounter (Signed)
Call previously resolved.

## 2015-08-31 NOTE — Telephone Encounter (Signed)
Thayer Ohm is calling in to get some clarification on their Effient prescription

## 2015-08-31 NOTE — Patient Instructions (Signed)
Medication Instructions:   START TAKING ASPIRIN 81 MG ONCE  A DAY   START TAKING ZIAC TWICE A DAY   START TAKING EFFIENT  TWICE A DAY    If you need a refill on your cardiac medications before your next appointment, please call your pharmacy.  Labwork: NONE ORDER TODAY    Testing/Procedures:  NONE ORDER TODAY     Follow-Up:    DR [place] IN 3 MONTHS   Your physician wants you to follow-up in:  IN  6  MONTHS WITH AMBER SEILER  You will receive a reminder letter in the mail two months in advance. If you don't receive a letter, please call our office to schedule the follow-up appointment.      Any Other Special Instructions Will Be Listed Below (If Applicable).

## 2015-08-31 NOTE — Addendum Note (Signed)
Addended by: Oleta Mouse on: 08/31/2015 01:33 PM   Modules accepted: Orders

## 2015-09-08 NOTE — Addendum Note (Signed)
Addended by: Reesa Chew on: 09/08/2015 05:29 PM   Modules accepted: Orders

## 2015-10-07 ENCOUNTER — Ambulatory Visit: Payer: Medicaid Other | Admitting: Internal Medicine

## 2015-12-12 ENCOUNTER — Ambulatory Visit: Payer: Medicaid Other | Admitting: Cardiology

## 2015-12-29 ENCOUNTER — Ambulatory Visit: Payer: Medicaid Other | Admitting: Nurse Practitioner

## 2016-02-13 NOTE — Progress Notes (Deleted)
Electrophysiology Office Note Date: 02/13/2016  ID:  Courtney Barker, DOB Oct 27, 1955, MRN [id number]  PCP: Martyn MalayAGONESI,PETER, MD  Primary Cardiologist: [place]  Electrophysiologist: Ladona Ridgelaylor  CC: follow up   Courtney Barker is a 60 y.o. female seen today for Dr Ladona Ridgelaylor. She is seen for follow up for post hospital follow up.  She underwent DES to OM earlier this year. Since last being seen in clinic, she has done relatively well. Her chest tightness is improved. She continues to smoke but is trying to quit. She reports medication compliance.  She denies  dyspnea, PND, orthopnea, nausea, vomiting, dizziness, syncope, edema, weight gain, or early satiety.     Past Medical History:  Diagnosis Date  . Anxiety   . Arthritis    "head to toe; neck, hands, arms, knees, feet, ankles" (08/26/2015)  . Asthma   . Cervical spondylosis with myelopathy    s/p C5-6 and C6-7 Anterior cervical discectomy/decompression; C5-6 and C6-7 interbody arthrodesis with local morcellized autograft bone and Actifuse bone graft extender; insertion of interbody prosthesis at C5-6 and C6-7 (Zimmer peek interbody prosthesis); anterior cervical plating from C5-6 and C6-7 with globus titanium plate 4/0/983/7/13  . Chronic lower back pain   . COPD (chronic obstructive pulmonary disease) (HCC)   . Coronary artery disease    s/p BMS x3 RCA '02; repeat cath in '02 and '05 were without significant ISR and normal LV function; pt reported unremarkable nuclear myoview 2011/12  . Depression   . Fibromyalgia   . GERD (gastroesophageal reflux disease)   . Headache(784.0)    "2-3 times/week" (08/26/2015)  . Heart murmur   . Hyperlipidemia    statin intolerant, to be followed by lipid clinic  . Hypertension   . Hypothyroidism   . Increased urinary protein excretion   . Myocardial infarction [facility](H[facility]) 12/2013   "[place]"  . Neuromuscular disorder (HCC)   . PONV (postoperative nausea and vomiting)   . Sleep apnea    NO STUDY DONE .[name]..   . Thyroid  nodule   . Tobacco abuse   . Type II diabetes mellitus (HCC)    Past Surgical History:  Procedure Laterality Date  . ABDOMINAL HYSTERECTOMY  2000   "fibroids; took 1 ovary"  . ANTERIOR CERVICAL DECOMP/DISCECTOMY FUSION  08/23/2011   Procedure: ANTERIOR CERVICAL DECOMPRESSION/DISCECTOMY FUSION 2 LEVELS;  Surgeon: Cristi LoronJeffrey D Jenkins, MD;  Location: [facility] NEUR[facility] ORS;  Service: Neurosurgery;  Laterality: N/A;  Anterior Cervical Five-Six/Six-Seven Decompression with Fusion with Interbody Prothesis, Plating, and Bonegraft  . BACK SURGERY    . BOWEL RESECTION  2007   . CARDIAC CATHETERIZATION N/A 08/26/2015   Procedure: Coronary Stent Intervention;  Surgeon: Peter M SwazilandJordan, MD;  [facility];  Service: Cardiovascular;  Laterality: N/A;  OM3 and circ  . CARDIAC CATHETERIZATION  2002; 2005   patent stents  . CARDIAC CATHETERIZATION  12/2013   CTO of the RCA ; moderate disease noted in the third OM and mild disease in the LAD; treated medically  . COLONOSCOPY W/ POLYPECTOMY    . CORONARY ANGIOPLASTY WITH STENT PLACEMENT  2002   s/p BMS x3 RCA   . MULTIPLE TOOTH EXTRACTIONS  2000  . TONSILLECTOMY    . TUBAL LIGATION  1981    Current Outpatient Prescriptions  Medication Sig Dispense Refill  . acetaminophen (TYLENOL) 500 MG tablet Take 1,000 mg by mouth every 6 (six) hours as needed (pain).     [name]. albuterol (PROVENTIL HFA;VENTOLIN HFA) 108 (90  BASE) MCG/ACT inhaler Inhale 2 puffs into the lungs every 6 (six) hours as needed for wheezing or shortness of breath.    [name] aspirin 81 MG tablet Take 81 mg by mouth daily.    . bisacodyl (DULCOLAX) 5 MG EC tablet Take 5 mg by mouth daily as needed. constipation    . bisoprolol-hydrochlorothiazide (ZIAC) 5-6.25 MG tablet Take 1 tablet by mouth 2 (two) times daily.    . fish oil-omega-3 fatty acids 1000 MG capsule Take 1 g by mouth 2 (two) times daily.     . fluticasone (FLONASE) 50 MCG/ACT nasal spray Place 2 sprays into the nose daily as needed for  allergies.     . furosemide (LASIX) 40 MG tablet Take 20 mg by mouth daily.     [name] LANTUS 100 UNIT/ML injection Inject 75 Units into the skin at bedtime.   5  . magnesium oxide (MAG-OX) 400 MG tablet Take 200 mg by mouth 2 (two) times daily.     . metFORMIN (GLUCOPHAGE-XR) 500 MG 24 hr tablet Take 2 tablets (1,000 mg total) by mouth 2 (two) times daily.    [name] Neomycin-Bacitracin-Polymyxin (TRIPLE ANTIBIOTIC EX) Apply 1 application topically as needed (for wound).    . nitroGLYCERIN (NITROSTAT) 0.4 MG SL tablet Place 1 tablet (0.4 mg total) under the tongue every 5 (five) minutes x 3 doses as needed. 25 tablet 5  . NOVOLOG 100 UNIT/ML injection Inject 15-20 Units into the skin 3 (three) times daily. INJECT 15 UNITS INTO THE SKIN WITH BREAKFAST, INJECT 20 UNITS INTO THE SKIN AT LUNCH TIME, INJECT 20 UNITS INTO THE SKIN WITH SUPPER. ANY TIME YOUR BG ARE GREATER THAN 150, INJECT INTO THE SKIN AS DIRECTED PER SLIDING SCALE  5  . omega-3 acid ethyl esters (LOVAZA) 1 g capsule Take 1 capsule (1 g total) by mouth 2 (two) times daily. 60 capsule 5  . polyethylene glycol (MIRALAX / GLYCOLAX) packet Take 17 g by mouth daily as needed. For constipation    . potassium chloride SA (K-DUR,KLOR-CON) 20 MEQ tablet Take 20 mEq by mouth daily.    . prasugrel (EFFIENT) 5 MG TABS tablet Take 1 tablet (5 mg total) by mouth 2 (two) times daily. 60 tablet 11  . RABEprazole (ACIPHEX) 20 MG tablet Take 20 mg by mouth daily.    [name] SYNTHROID 150 MCG tablet Take 150 mcg by mouth daily.   5  . traMADol (ULTRAM) 50 MG tablet Take 50 mg by mouth every 8 (eight) hours as needed (PAIN).    . Vitamin D, Ergocalciferol, (DRISDOL) 50000 UNITS CAPS Take 50,000 Units by mouth every 7 (seven) days.     No current facility-administered medications for this visit.     Allergies:   Clonidine derivatives; Lisinopril; Losartan; Metoprolol; Penicillins; Plavix [clopidogrel bisulfate]; Quinapril hcl; Repaglinide; Toprol xl [metoprolol tartrate];  Trovan [alatrofloxacin]; Atorvastatin; Candesartan cilexetil; Clarithromycin; Clopidogrel bisulfate; Ezetimibe; Hydromorphone hcl; Hydromorphone hcl; Metoclopramide hcl; Morphine and related; Oxycodone-acetaminophen; Oxycodone-acetaminophen; Paroxetine; Paroxetine hcl; Pioglitazone; Quinapril hcl; Valsartan; and Vancomycin   Social History: Social History   Social History  . Marital status: Married    Spouse name: N/A  . Number of children: N/A  . Years of education: N/A   Occupational History  . Not on file.   Social History Main Topics  . Smoking status: Current Every Day Smoker    Packs/day: 1.00    Years: 43.00    Types: Cigarettes  . Smokeless tobacco: Never Used  . Alcohol use No  .  Drug use: No  . Sexual activity: Not Currently   Other Topics Concern  . Not on file   Social History Narrative  . No narrative on file    Family History: Family History  Problem Relation Age of Onset  . Anesthesia problems Mother   . Anesthesia problems Sister   . Neuropathy Father   . Heart attack Father     Cause of death  . Neuropathy Sister   . Diabetes Sister   . Heart Problems Sister   . Diabetes Maternal Grandfather   . Lupus Sister     Review of Systems: All other systems reviewed and are otherwise negative except as noted above.   Physical Exam: VS:  There were no vitals taken for this visit. , BMI There is no height or weight on file to calculate BMI. Wt Readings from Last 3 Encounters:  08/31/15 193 lb (87.5 kg)  08/27/15 191 lb 9.3 oz (86.9 kg)  07/22/15 189 lb 6.4 oz (85.9 kg)    GEN- The patient is elderly, obese appearing, alert and oriented x 3 today, strong tobacco smell HEENT: normocephalic, atraumatic; sclera clear, conjunctiva pink; hearing intact; oropharynx clear; neck supple  Lungs- Clear to ausculation bilaterally, normal work of breathing.  No wheezes, rales, rhonchi Heart- Regular rate and rhythm, no murmurs, rubs or gallops  GI- obese,  non-tender, non-distended, bowel sounds present  Extremities- no clubbing, cyanosis, or edema  MS- no significant deformity or atrophy Skin- warm and dry, no rash or lesion  Psych- euthymic mood, full affect Neuro- strength and sensation are intact   Recent Labs: 03/30/2015: Magnesium 1.8 08/27/2015: BUN 9; Creatinine, Ser 0.67; Hemoglobin 13.9; Platelets 176; Potassium 3.7; Sodium 142    Other studies Reviewed: Additional studies/ records that were reviewed today include: Dr Lubertha Basque office notes  Assessment and Plan: 1. CAD S/p DES to [facility] 08/26/15 Continue Effient and ASA Discussed with Dr Eldridge Dace today - Effient 10mg  daily is recommended dose. Pt is willing to try 10mg  daily in divided doses - will order Effient 5mg  twice daily and decrease ASA to 81mg  daily She needs to establish with general cardiology and feels like she had a good relationship with Dr Swaziland. She would like to follow with him. Previous appt was not scheduled - will schedule today.   2.  Tobacco abuse Making improvements Complete cessation advised  3.  HTN Stable No change required today  4. Palpitations Stable on Ziac   Current medicines are reviewed at length with the patient today.   The patient does not have concerns regarding her medicines.  The following changes were made today:  none  Labs/ tests ordered today include: none   Disposition:   Follow up with Dr Swaziland in 3 months, Dr Ladona Ridgel in 1 year   Signed, Gypsy Balsam, NP 02/13/2016 9:55 AM   [facility] [address] [place] [address] [phone] (office) [phone] (fax)

## 2016-02-15 ENCOUNTER — Ambulatory Visit: Payer: Medicaid Other | Admitting: Nurse Practitioner

## 2016-02-21 ENCOUNTER — Telehealth: Payer: Self-pay

## 2016-02-21 NOTE — Telephone Encounter (Signed)
Initiated a prior [place] for Effient 10mg  through [facility]. Due to their high call volume, it will be routed to pharmacy team. They will get back to me in 24-48 hours.

## 2016-02-27 ENCOUNTER — Telehealth: Payer: Self-pay

## 2016-02-27 NOTE — Telephone Encounter (Signed)
Effient filled at pharmacy without a PA.

## 2016-03-12 NOTE — Progress Notes (Deleted)
Electrophysiology Office Note Date: 03/12/2016  ID:  Courtney Barker, DOB January 31, 1956, MRN [id number]  PCP: Martyn Malay, MD  Primary Cardiologist: [place]  Electrophysiologist: Ladona Ridgel  CC: CAD/palpitation follow up  Courtney Barker is a 60 y.o. female seen today for Dr Ladona Ridgel. She is seen for routine EP follow up. Since last being seen in clinic, she has done relatively well.  She denies  dyspnea, PND, orthopnea, nausea, vomiting, dizziness, syncope, edema, weight gain, or early satiety.     Past Medical History:  Diagnosis Date  . Anxiety   . Arthritis    "head to toe; neck, hands, arms, knees, feet, ankles" (08/26/2015)  . Asthma   . Cervical spondylosis with myelopathy    s/p C5-6 and C6-7 Anterior cervical discectomy/decompression; C5-6 and C6-7 interbody arthrodesis with local morcellized autograft bone and Actifuse bone graft extender; insertion of interbody prosthesis at C5-6 and C6-7 (Zimmer peek interbody prosthesis); anterior cervical plating from C5-6 and C6-7 with globus titanium plate 4/0/98  . Chronic lower back pain   . COPD (chronic obstructive pulmonary disease) (HCC)   . Coronary artery disease    s/p BMS x3 RCA '02; repeat cath in '02 and '05 were without significant ISR and normal LV function; pt reported unremarkable nuclear myoview 2011/12  . Depression   . Fibromyalgia   . GERD (gastroesophageal reflux disease)   . Headache(784.0)    "2-3 times/week" (08/26/2015)  . Heart murmur   . Hyperlipidemia    statin intolerant, to be followed by lipid clinic  . Hypertension   . Hypothyroidism   . Increased urinary protein excretion   . Myocardial infarction [facility]) 12/2013   "[place]"  . Neuromuscular disorder (HCC)   . PONV (postoperative nausea and vomiting)   . Sleep apnea    NO STUDY DONE .[name].   . Thyroid nodule   . Tobacco abuse   . Type II diabetes mellitus (HCC)    Past Surgical History:  Procedure Laterality Date  . ABDOMINAL HYSTERECTOMY  2000   "fibroids; took 1 ovary"  . ANTERIOR CERVICAL DECOMP/DISCECTOMY FUSION  08/23/2011   Procedure: ANTERIOR CERVICAL DECOMPRESSION/DISCECTOMY FUSION 2 LEVELS;  Surgeon: Cristi Loron, MD;  Location: [facility];  Service: Neurosurgery;  Laterality: N/A;  Anterior Cervical Five-Six/Six-Seven Decompression with Fusion with Interbody Prothesis, Plating, and Bonegraft  . BACK SURGERY    . BOWEL RESECTION  2007   . CARDIAC CATHETERIZATION N/A 08/26/2015   Procedure: Coronary Stent Intervention;  Surgeon: Peter M Swaziland, MD;  Location: [facility] INVASIVE CV LAB;  Service: Cardiovascular;  Laterality: N/A;  OM3 and circ  . CARDIAC CATHETERIZATION  2002; 2005   patent stents  . CARDIAC CATHETERIZATION  12/2013   CTO of the RCA ; moderate disease noted in the third OM and mild disease in the LAD; treated medically  . COLONOSCOPY W/ POLYPECTOMY    . CORONARY ANGIOPLASTY WITH STENT PLACEMENT  2002   s/p BMS x3 RCA   . MULTIPLE TOOTH EXTRACTIONS  2000  . TONSILLECTOMY    . TUBAL LIGATION  1981    Current Outpatient Prescriptions  Medication Sig Dispense Refill  . acetaminophen (TYLENOL) 500 MG tablet Take 1,000 mg by mouth every 6 (six) hours as needed (pain).     [name] albuterol (PROVENTIL HFA;VENTOLIN HFA) 108 (90 BASE) MCG/ACT inhaler Inhale 2 puffs into the lungs every 6 (six) hours as needed for wheezing or shortness of breath.    [name] aspirin 81 MG tablet Take 81  mg by mouth daily.    . bisacodyl (DULCOLAX) 5 MG EC tablet Take 5 mg by mouth daily as needed. constipation    . bisoprolol-hydrochlorothiazide (ZIAC) 5-6.25 MG tablet Take 1 tablet by mouth 2 (two) times daily.    . fish oil-omega-3 fatty acids 1000 MG capsule Take 1 g by mouth 2 (two) times daily.     . fluticasone (FLONASE) 50 MCG/ACT nasal spray Place 2 sprays into the nose daily as needed for allergies.     . furosemide (LASIX) 40 MG tablet Take 20 mg by mouth daily.     [name]. LANTUS 100 UNIT/ML injection Inject 75 Units into the skin at bedtime.    5  . magnesium oxide (MAG-OX) 400 MG tablet Take 200 mg by mouth 2 (two) times daily.     . metFORMIN (GLUCOPHAGE-XR) 500 MG 24 hr tablet Take 2 tablets (1,000 mg total) by mouth 2 (two) times daily.    [name]. Neomycin-Bacitracin-Polymyxin (TRIPLE ANTIBIOTIC EX) Apply 1 application topically as needed (for wound).    . nitroGLYCERIN (NITROSTAT) 0.4 MG SL tablet Place 1 tablet (0.4 mg total) under the tongue every 5 (five) minutes x 3 doses as needed. 25 tablet 5  . NOVOLOG 100 UNIT/ML injection Inject 15-20 Units into the skin 3 (three) times daily. INJECT 15 UNITS INTO THE SKIN WITH BREAKFAST, INJECT 20 UNITS INTO THE SKIN AT LUNCH TIME, INJECT 20 UNITS INTO THE SKIN WITH SUPPER. ANY TIME YOUR BG ARE GREATER THAN 150, INJECT INTO THE SKIN AS DIRECTED PER SLIDING SCALE  5  . omega-3 acid ethyl esters (LOVAZA) 1 g capsule Take 1 capsule (1 g total) by mouth 2 (two) times daily. 60 capsule 5  . polyethylene glycol (MIRALAX / GLYCOLAX) packet Take 17 g by mouth daily as needed. For constipation    . potassium chloride SA (K-DUR,KLOR-CON) 20 MEQ tablet Take 20 mEq by mouth daily.    . prasugrel (EFFIENT) 5 MG TABS tablet Take 1 tablet (5 mg total) by mouth 2 (two) times daily. 60 tablet 11  . RABEprazole (ACIPHEX) 20 MG tablet Take 20 mg by mouth daily.    [name]. SYNTHROID 150 MCG tablet Take 150 mcg by mouth daily.   5  . traMADol (ULTRAM) 50 MG tablet Take 50 mg by mouth every 8 (eight) hours as needed (PAIN).    . Vitamin D, Ergocalciferol, (DRISDOL) 50000 UNITS CAPS Take 50,000 Units by mouth every 7 (seven) days.     No current facility-administered medications for this visit.     Allergies:   Clonidine derivatives; Lisinopril; Losartan; Metoprolol; Penicillins; Plavix [clopidogrel bisulfate]; Quinapril hcl; Repaglinide; Toprol xl [metoprolol tartrate]; Trovan [alatrofloxacin]; Atorvastatin; Candesartan cilexetil; Clarithromycin; Clopidogrel bisulfate; Ezetimibe; Hydromorphone hcl; Hydromorphone hcl;  Metoclopramide hcl; Morphine and related; Oxycodone-acetaminophen; Oxycodone-acetaminophen; Paroxetine; Paroxetine hcl; Pioglitazone; Quinapril hcl; Valsartan; and Vancomycin   Social History: Social History   Social History  . Marital status: Married    Spouse name: N/A  . Number of children: N/A  . Years of education: N/A   Occupational History  . Not on file.   Social History Main Topics  . Smoking status: Current Every Day Smoker    Packs/day: 1.00    Years: 43.00    Types: Cigarettes  . Smokeless tobacco: Never Used  . Alcohol use No  . Drug use: No  . Sexual activity: Not Currently   Other Topics Concern  . Not on file   Social History Narrative  . No narrative on file  Family History: Family History  Problem Relation Age of Onset  . Anesthesia problems Mother   . Anesthesia problems Sister   . Neuropathy Father   . Heart attack Father     Cause of death  . Neuropathy Sister   . Diabetes Sister   . Heart Problems Sister   . Diabetes Maternal Grandfather   . Lupus Sister     Review of Systems: All other systems reviewed and are otherwise negative except as noted above.   Physical Exam: VS:  There were no vitals taken for this visit. , BMI There is no height or weight on file to calculate BMI. Wt Readings from Last 3 Encounters:  08/31/15 193 lb (87.5 kg)  08/27/15 191 lb 9.3 oz (86.9 kg)  07/22/15 189 lb 6.4 oz (85.9 kg)    GEN- The patient is elderly, obese appearing, alert and oriented x 3 today, strong tobacco smell HEENT: normocephalic, atraumatic; sclera clear, conjunctiva pink; hearing intact; oropharynx clear; neck supple  Lungs- Clear to ausculation bilaterally, normal work of breathing.  No wheezes, rales, rhonchi Heart- Regular rate and rhythm, no murmurs, rubs or gallops  GI- obese, non-tender, non-distended, bowel sounds present  Extremities- no clubbing, cyanosis, or edema  MS- no significant deformity or atrophy Skin- warm and dry,  no rash or lesion  Psych- euthymic mood, full affect Neuro- strength and sensation are intact   Recent Labs: 03/30/2015: Magnesium 1.8 08/27/2015: BUN 9; Creatinine, Ser 0.67; Hemoglobin 13.9; Platelets 176; Potassium 3.7; Sodium 142    Other studies Reviewed: Additional studies/ records that were reviewed today include: Dr Lubertha Basque office notes  Assessment and Plan: 1. CAD S/p DES to [facility] 08/26/15 Continue Effient and ASA Discussed with Dr Eldridge Dace today - Effient 10mg  daily is recommended dose. Pt is willing to try 10mg  daily in divided doses - will order Effient 5mg  twice daily and decrease ASA to 81mg  daily Refer to [place] [facility] *** She needs to establish with general cardiology and feels like she had a good relationship with Dr Swaziland. She would like to follow with him.   2.  Tobacco abuse Making improvements Complete cessation advised  3.  HTN Stable No change required today  4. Palpitations Stable No change required today   Current medicines are reviewed at length with the patient today.   The patient does not have concerns regarding her medicines.  The following changes were made today:  none  Labs/ tests ordered today include: none   Disposition:   Follow up with Dr [place]iland in 3 months, EP as needed   Signed, Gypsy Balsam, NP 03/12/2016 8:28 AM   [facility] [address] [place] [address] [phone] (office) [phone] (fax)

## 2016-03-13 ENCOUNTER — Ambulatory Visit: Payer: Medicaid Other | Admitting: Nurse Practitioner

## 2016-03-14 ENCOUNTER — Ambulatory Visit: Payer: Medicaid Other | Admitting: Nurse Practitioner

## 2016-04-26 ENCOUNTER — Ambulatory Visit (INDEPENDENT_AMBULATORY_CARE_PROVIDER_SITE_OTHER): Payer: Medicaid Other | Admitting: Nurse Practitioner

## 2016-04-26 ENCOUNTER — Encounter: Payer: Self-pay | Admitting: Nurse Practitioner

## 2016-04-26 VITALS — BP 152/78 | HR 52 | Ht 60.0 in | Wt 191.2 lb

## 2016-04-26 DIAGNOSIS — R002 Palpitations: Secondary | ICD-10-CM

## 2016-04-26 DIAGNOSIS — I25810 Atherosclerosis of coronary artery bypass graft(s) without angina pectoris: Secondary | ICD-10-CM | POA: Diagnosis not present

## 2016-04-26 DIAGNOSIS — I10 Essential (primary) hypertension: Secondary | ICD-10-CM | POA: Diagnosis not present

## 2016-04-26 DIAGNOSIS — Z720 Tobacco use: Secondary | ICD-10-CM | POA: Diagnosis not present

## 2016-04-26 LAB — BASIC METABOLIC PANEL
BUN: 11 mg/dL (ref 7–25)
CO2: 28 mmol/L (ref 20–31)
Calcium: 9.7 mg/dL (ref 8.6–10.4)
Chloride: 101 mmol/L (ref 98–110)
Creat: 0.73 mg/dL (ref 0.50–1.05)
Glucose, Bld: 133 mg/dL — ABNORMAL HIGH (ref 65–99)
Potassium: 4.2 mmol/L (ref 3.5–5.3)
Sodium: 138 mmol/L (ref 135–146)

## 2016-04-26 LAB — CBC
HCT: 44.9 % (ref 35.0–45.0)
Hemoglobin: 16 g/dL — ABNORMAL HIGH (ref 11.7–15.5)
MCH: 34.3 pg — ABNORMAL HIGH (ref 27.0–33.0)
MCHC: 35.6 g/dL (ref 32.0–36.0)
MCV: 96.4 fL (ref 80.0–100.0)
MPV: 11 fL (ref 7.5–12.5)
Platelets: 210 10*3/uL (ref 140–400)
RBC: 4.66 MIL/uL (ref 3.80–5.10)
RDW: 13.4 % (ref 11.0–15.0)
WBC: 9.7 10*3/uL (ref 3.8–10.8)

## 2016-04-26 NOTE — Progress Notes (Signed)
Electrophysiology Office Note Date: 04/26/2016  ID:  Courtney Barker, DOB June 25, 1955, MRN [id number]  PCP: Martyn Malay, MD  Primary Cardiologist: [place]  Electrophysiologist: Ladona Ridgel  CC: follow up on chest pain  Courtney Barker is a 60 y.o. female seen today for Dr Ladona Ridgel. She is seen for routine EP follow up.  She underwent DES to OM in 3/201.  Since last being seen in clinic, she has done relatively well. Her chest tightness is improved. She continues to smoke but is trying to quit. She has tolerated Effient without bleeding or other complications.  Her palpitations have not recurred on increased dose of Ziac. She denies  dyspnea, PND, orthopnea, nausea, vomiting, dizziness, syncope, edema, weight gain, or early satiety.     Past Medical History:  Diagnosis Date  . Anxiety   . Arthritis    "head to toe; neck, hands, arms, knees, feet, ankles" (08/26/2015)  . Asthma   . Cervical spondylosis with myelopathy    s/p C5-6 and C6-7 Anterior cervical discectomy/decompression; C5-6 and C6-7 interbody arthrodesis with local morcellized autograft bone and Actifuse bone graft extender; insertion of interbody prosthesis at C5-6 and C6-7 (Zimmer peek interbody prosthesis); anterior cervical plating from C5-6 and C6-7 with globus titanium plate 4/0/98  . Chronic lower back pain   . COPD (chronic obstructive pulmonary disease) (HCC)   . Coronary artery disease    s/p BMS x3 RCA '02; repeat cath in '02 and '05 were without significant ISR and normal LV function; pt reported unremarkable nuclear myoview 2011/12  . Depression   . Fibromyalgia   . GERD (gastroesophageal reflux disease)   . Headache(784.0)    "2-3 times/week" (08/26/2015)  . Heart murmur   . Hyperlipidemia    statin intolerant, to be followed by lipid clinic  . Hypertension   . Hypothyroidism   . Increased urinary protein excretion   . Myocardial infarction 12/2013   "[place]"  . Neuromuscular disorder (HCC)   . PONV  (postoperative nausea and vomiting)   . Sleep apnea    NO STUDY DONE .[name].   . Thyroid nodule   . Tobacco abuse   . Type II diabetes mellitus (HCC)    Past Surgical History:  Procedure Laterality Date  . ABDOMINAL HYSTERECTOMY  2000   "fibroids; took 1 ovary"  . ANTERIOR CERVICAL DECOMP/DISCECTOMY FUSION  08/23/2011   Procedure: ANTERIOR CERVICAL DECOMPRESSION/DISCECTOMY FUSION 2 LEVELS;  Surgeon: Cristi Loron, MD;  Location: [facility];  Service: Neurosurgery;  Laterality: N/A;  Anterior Cervical Five-Six/Six-Seven Decompression with Fusion with Interbody Prothesis, Plating, and Bonegraft  . BACK SURGERY    . BOWEL RESECTION  2007   . CARDIAC CATHETERIZATION N/A 08/26/2015   Procedure: Coronary Stent Intervention;  Surgeon: Peter M Swaziland, MD;  Location: [facility] INVASIVE CV LAB;  Service: Cardiovascular;  Laterality: N/A;  OM3 and circ  . CARDIAC CATHETERIZATION  2002; 2005   patent stents  . CARDIAC CATHETERIZATION  12/2013   CTO of the RCA ; moderate disease noted in the third OM and mild disease in the LAD; treated medically  . COLONOSCOPY W/ POLYPECTOMY    . CORONARY ANGIOPLASTY WITH STENT PLACEMENT  2002   s/p BMS x3 RCA   . MULTIPLE TOOTH EXTRACTIONS  2000  . TONSILLECTOMY    . TUBAL LIGATION  1981    Current Outpatient Prescriptions  Medication Sig Dispense Refill  . acetaminophen (TYLENOL) 500 MG tablet Take 1,000 mg by mouth every 6 (six) hours as  needed (pain).     [name]. albuterol (PROVENTIL HFA;VENTOLIN HFA) 108 (90 BASE) MCG/ACT inhaler Inhale 2 puffs into the lungs every 6 (six) hours as needed for wheezing or shortness of breath.    [name]. aspirin 81 MG tablet Take 81 mg by mouth daily.    . bisacodyl (DULCOLAX) 5 MG EC tablet Take 5 mg by mouth daily as needed. constipation    . fish oil-omega-3 fatty acids 1000 MG capsule Take 1 g by mouth 2 (two) times daily.     . fluticasone (FLONASE) 50 MCG/ACT nasal spray Place 2 sprays into the nose daily as needed for allergies.     .  furosemide (LASIX) 40 MG tablet Take 20 mg by mouth daily.     . insulin aspart (NOVOLOG) 100 UNIT/ML injection Inject SQ 25 units w/ meals, 15 units with snacks if sugar >110 (if sugar 80-110 take 8 units with snack) plus correction 1 per 25 over 150    . LANTUS 100 UNIT/ML injection Inject 82 Units into the skin at bedtime.   5  . magnesium oxide (MAG-OX) 400 MG tablet Take 200 mg by mouth 2 (two) times daily.     . metFORMIN (GLUCOPHAGE-XR) 500 MG 24 hr tablet Take 2 tablets (1,000 mg total) by mouth 2 (two) times daily.    [name]. Neomycin-Bacitracin-Polymyxin (TRIPLE ANTIBIOTIC EX) Apply 1 application topically as needed (for wound).    . nitroGLYCERIN (NITROSTAT) 0.4 MG SL tablet Place 1 tablet (0.4 mg total) under the tongue every 5 (five) minutes x 3 doses as needed. 25 tablet 5  . NOVOLOG 100 UNIT/ML injection Inject 15-20 Units into the skin 3 (three) times daily. INJECT 15 UNITS INTO THE SKIN WITH BREAKFAST, INJECT 20 UNITS INTO THE SKIN AT LUNCH TIME, INJECT 20 UNITS INTO THE SKIN WITH SUPPER. ANY TIME YOUR BG ARE GREATER THAN 150, INJECT INTO THE SKIN AS DIRECTED PER SLIDING SCALE  5  . omega-3 acid ethyl esters (LOVAZA) 1 g capsule Take 1 capsule (1 g total) by mouth 2 (two) times daily. 60 capsule 5  . polyethylene glycol (MIRALAX / GLYCOLAX) packet Take 17 g by mouth daily as needed. For constipation    . potassium chloride SA (K-DUR,KLOR-CON) 20 MEQ tablet Take 20 mEq by mouth daily.    . prasugrel (EFFIENT) 5 MG TABS tablet Take 1 tablet (5 mg total) by mouth 2 (two) times daily. 60 tablet 11  . RABEprazole (ACIPHEX) 20 MG tablet Take 20 mg by mouth daily.    [name]. SYNTHROID 150 MCG tablet Take 150 mcg by mouth daily.   5  . traMADol (ULTRAM) 50 MG tablet Take 50 mg by mouth every 8 (eight) hours as needed (PAIN).    . Vitamin D, Ergocalciferol, (DRISDOL) 50000 UNITS CAPS Take 50,000 Units by mouth every 7 (seven) days.    . bisoprolol-hydrochlorothiazide (ZIAC) 5-6.25 MG tablet Take 1 tablet  by mouth 2 (two) times daily.     No current facility-administered medications for this visit.     Allergies:   Clonidine derivatives; Lisinopril; Losartan; Penicillins; Plavix [clopidogrel bisulfate]; Quinapril hcl; Repaglinide; Toprol xl [metoprolol tartrate]; Trovan [alatrofloxacin]; Atorvastatin; Candesartan cilexetil; Clarithromycin; Clopidogrel bisulfate; Ezetimibe; Hydromorphone hcl; Metoclopramide hcl; Morphine and related; Oxycodone-acetaminophen; Paroxetine hcl; Pioglitazone; Valsartan; and Vancomycin   Social History: Social History   Social History  . Marital status: Married    Spouse name: N/A  . Number of children: N/A  . Years of education: N/A   Occupational History  .  Not on file.   Social History Main Topics  . Smoking status: Current Every Day Smoker    Packs/day: 1.00    Years: 43.00    Types: Cigarettes  . Smokeless tobacco: Never Used  . Alcohol use No  . Drug use: No  . Sexual activity: Not Currently   Other Topics Concern  . Not on file   Social History Narrative  . No narrative on file    Family History: Family History  Problem Relation Age of Onset  . Anesthesia problems Mother   . Anesthesia problems Sister   . Neuropathy Father   . Heart attack Father     Cause of death  . Neuropathy Sister   . Diabetes Sister   . Heart Problems Sister   . Diabetes Maternal Grandfather   . Lupus Sister     Review of Systems: All other systems reviewed and are otherwise negative except as noted above.   Physical Exam: VS:  BP (!) 152/78   Pulse (!) 52   Ht 5' (1.524 m)   Wt 191 lb 4 oz (86.8 kg)   BMI 37.35 kg/m  , BMI Body mass index is 37.35 kg/m. Wt Readings from Last 3 Encounters:  04/26/16 191 lb 4 oz (86.8 kg)  08/31/15 193 lb (87.5 kg)  08/27/15 191 lb 9.3 oz (86.9 kg)    GEN- The patient is elderly, obese appearing, alert and oriented x 3 today, strong tobacco smell HEENT: normocephalic, atraumatic; sclera clear, conjunctiva  pink; hearing intact; oropharynx clear; neck supple  Lungs- Clear to ausculation bilaterally, normal work of breathing.  No wheezes, rales, rhonchi Heart- Regular rate and rhythm, no murmurs, rubs or gallops  GI- obese, non-tender, non-distended, bowel sounds present  Extremities- no clubbing, cyanosis, or edema  MS- no significant deformity or atrophy Skin- warm and dry, no rash or lesion  Psych- euthymic mood, full affect Neuro- strength and sensation are intact   Recent Labs: 08/27/2015: BUN 9; Creatinine, Ser 0.67; Hemoglobin 13.9; Platelets 176; Potassium 3.7; Sodium 142    Other studies Reviewed: Additional studies/ records that were reviewed today include: Dr Lubertha Basque office notes  Assessment and Plan: 1. CAD S/p DES to [facility] 08/26/15 Continue Effient and ASA She needs to re-establish with general cardiology and feels like she had a good relationship with Dr Swaziland. She would like to follow with him.   2.  Tobacco abuse Making improvements Complete cessation advised  3.  HTN Stable No change required today  4. Palpitations Stable No change required today  Current medicines are reviewed at length with the patient today.   The patient does not have concerns regarding her medicines.  The following changes were made today:  none  Labs/ tests ordered today include: none   Disposition:   Follow up with Dr Swaziland in 3 months, EP prn   Signed, Gypsy Balsam, NP 04/26/2016 10:01 AM   [facility] [address] [place] [address] [phone] (office) [phone] (fax)

## 2016-04-26 NOTE — Patient Instructions (Addendum)
Medication Instructions:   Your physician recommends that you continue on your current medications as directed. Please refer to the Current Medication list given to you today.    If you need a refill on your cardiac medications before your next appointment, please call your pharmacy.  Labwork: CBC AND BMET    Testing/Procedures: NONE ORDERED  TODAY    Follow-Up: IN 3 MONTHS WITH DR Swaziland     Any Other Special Instructions Will Be Listed Below (If Applicable).

## 2016-08-16 ENCOUNTER — Other Ambulatory Visit: Payer: Self-pay | Admitting: *Deleted

## 2016-08-16 MED ORDER — NITROGLYCERIN 0.4 MG SL SUBL: 0.4000 mg | SUBLINGUAL_TABLET | SUBLINGUAL | 5 refills | Status: DC | PRN

## 2016-09-06 NOTE — Progress Notes (Signed)
Cardiology Office Note    Date:  09/07/2016   ID:  Courtney Barker, DOB 06-29-1955, MRN [id number]  PCP:  Martyn Malay, MD  Cardiologist:  Traves Majchrzak [place], MD    History of Present Illness:  Courtney Barker is a 61 y.o. female seen for follow up CAD. I had seen her previously in the hospital in March 2017. She has been followed by Dr. Ladona Ridgel but since she has no significant history of arrhythmia it was felt that she should see me for ongoing cardiac care.She is s/p BMS stenting of the RCA in 2002. Repeat caths in 2002 and 2005 here showed patent stents. She had cardiac cath in July 2015 at [facility] that showed CTO of the RCA and was treated medically. There was moderate disease noted in the third OM and mild disease in the LAD. She has DM, HTN, hyperlipidemia, and ongoing tobacco abuse. She presented in March 2017 with chest pain.She had a Myoview stress test that showed a large partially reversible defect in the apex and inferolateral wall. EF 56%. She then underwent cardiac cath that showed CTO of the RCA at prior stent site. There was a large OM3 with a 99% stenosis that was successfully stented with DES x 2. She was DC on ASA and Effient. She has a number of drug intolerances. She does have a history of PVCs with palpitations that have been well controlled on beta blocker therapy.  On follow up today she states she is doing OK. She is trying to quit smoking. Unable to take Wellbutrin or Chantix. Sometimes has pressure in arms and hands with minimal chest pressure. Hasn't tried Ntg. Symptoms are infrequent and sporadic. She has bad arthritis. Feels "puffy" sometimes.     Past Medical History:  Diagnosis Date  . Anxiety   . Arthritis    "head to toe; neck, hands, arms, knees, feet, ankles" (08/26/2015)  . Asthma   . Cervical spondylosis with myelopathy    s/p C5-6 and C6-7 Anterior cervical discectomy/decompression; C5-6 and C6-7 interbody arthrodesis with local morcellized autograft bone  and Actifuse bone graft extender; insertion of interbody prosthesis at C5-6 and C6-7 (Zimmer peek interbody prosthesis); anterior cervical plating from C5-6 and C6-7 with globus titanium plate 4/0/98  . Chronic lower back pain   . COPD (chronic obstructive pulmonary disease) (HCC)   . Coronary artery disease    s/p BMS x3 RCA '02; repeat cath in '02 and '05 were without significant ISR and normal LV function; pt reported unremarkable nuclear myoview 2011/12  . Depression   . Fibromyalgia   . GERD (gastroesophageal reflux disease)   . Headache(784.0)    "2-3 times/week" (08/26/2015)  . Heart murmur   . Hyperlipidemia    statin intolerant, to be followed by lipid clinic  . Hypertension   . Hypothyroidism   . Increased urinary protein excretion   . Myocardial infarction 12/2013   "[place]"  . Neuromuscular disorder (HCC)   . PONV (postoperative nausea and vomiting)   . Sleep apnea    NO STUDY DONE .[name].   . Thyroid nodule   . Tobacco abuse   . Type II diabetes mellitus (HCC)     Past Surgical History:  Procedure Laterality Date  . ABDOMINAL HYSTERECTOMY  2000   "fibroids; took 1 ovary"  . ANTERIOR CERVICAL DECOMP/DISCECTOMY FUSION  08/23/2011   Procedure: ANTERIOR CERVICAL DECOMPRESSION/DISCECTOMY FUSION 2 LEVELS;  Surgeon: Cristi Loron, MD;  Location: [facility];  Service: Neurosurgery;  Laterality:  N/A;  Anterior Cervical Five-Six/Six-Seven Decompression with Fusion with Interbody Prothesis, Plating, and Bonegraft  . BACK SURGERY    . BOWEL RESECTION  2007   . CARDIAC CATHETERIZATION N/A 08/26/2015   Procedure: Coronary Stent Intervention;  Surgeon: Cheralyn Oliver M [place], MD;  Location: [facility] INVASIVE CV LAB;  Service: Cardiovascular;  Laterality: N/A;  OM3 and circ  . CARDIAC CATHETERIZATION  2002; 2005   patent stents  . CARDIAC CATHETERIZATION  12/2013   CTO of the RCA ; moderate disease noted in the third OM and mild disease in the LAD; treated medically  . COLONOSCOPY W/  POLYPECTOMY    . CORONARY ANGIOPLASTY WITH STENT PLACEMENT  2002   s/p BMS x3 RCA   . MULTIPLE TOOTH EXTRACTIONS  2000  . TONSILLECTOMY    . TUBAL LIGATION  1981    Current Medications: Outpatient Medications Prior to Visit  Medication Sig Dispense Refill  . acetaminophen (TYLENOL) 500 MG tablet Take 1,000 mg by mouth every 6 (six) hours as needed (pain).     [name] albuterol (PROVENTIL HFA;VENTOLIN HFA) 108 (90 BASE) MCG/ACT inhaler Inhale 2 puffs into the lungs every 6 (six) hours as needed for wheezing or shortness of breath.    [name] aspirin 81 MG tablet Take 81 mg by mouth daily.    . bisacodyl (DULCOLAX) 5 MG EC tablet Take 5 mg by mouth daily as needed. constipation    . fish oil-omega-3 fatty acids 1000 MG capsule Take 1 g by mouth 2 (two) times daily.     . fluticasone (FLONASE) 50 MCG/ACT nasal spray Place 2 sprays into the nose daily as needed for allergies.     . furosemide (LASIX) 40 MG tablet Take 20 mg by mouth daily.     . insulin aspart (NOVOLOG) 100 UNIT/ML injection Inject SQ 35 units w/ meals, 15 units with snacks if sugar >110 (if sugar 80-110 take 8 units with snack) plus correction 1 per 25 over 150    . LANTUS 100 UNIT/ML injection Inject 90 Units into the skin at bedtime.   5  . magnesium oxide (MAG-OX) 400 MG tablet Take 200 mg by mouth 2 (two) times daily.     . metFORMIN (GLUCOPHAGE-XR) 500 MG 24 hr tablet Take 2 tablets (1,000 mg total) by mouth 2 (two) times daily.    [name] Neomycin-Bacitracin-Polymyxin (TRIPLE ANTIBIOTIC EX) Apply 1 application topically as needed (for wound).    . nitroGLYCERIN (NITROSTAT) 0.4 MG SL tablet Place 1 tablet (0.4 mg total) under the tongue every 5 (five) minutes x 3 doses as needed. 25 tablet 5  . NOVOLOG 100 UNIT/ML injection Inject 15-20 Units into the skin 3 (three) times daily. INJECT 15 UNITS INTO THE SKIN WITH BREAKFAST, INJECT 20 UNITS INTO THE SKIN AT LUNCH TIME, INJECT 20 UNITS INTO THE SKIN WITH SUPPER. ANY TIME YOUR BG ARE GREATER  THAN 150, INJECT INTO THE SKIN AS DIRECTED PER SLIDING SCALE  5  . omega-3 acid ethyl esters (LOVAZA) 1 g capsule Take 1 capsule (1 g total) by mouth 2 (two) times daily. 60 capsule 5  . polyethylene glycol (MIRALAX / GLYCOLAX) packet Take 17 g by mouth daily as needed. For constipation    . potassium chloride SA (K-DUR,KLOR-CON) 20 MEQ tablet Take 20 mEq by mouth daily.    [name] SYNTHROID 150 MCG tablet Take 150 mcg by mouth daily. TAKE 1 TABLET EVERYDAY EXCEPT TAKE 1 AND 1/2 TABLETS ON SUNDAYS.  5  . traMADol (ULTRAM) 50 MG  tablet Take 50 mg by mouth every 8 (eight) hours as needed (PAIN).    . Vitamin D, Ergocalciferol, (DRISDOL) 50000 UNITS CAPS Take 50,000 Units by mouth every 7 (seven) days.    . prasugrel (EFFIENT) 5 MG TABS tablet Take 1 tablet (5 mg total) by mouth 2 (two) times daily. 60 tablet 11  . bisoprolol-hydrochlorothiazide (ZIAC) 5-6.25 MG tablet Take 1 tablet by mouth 2 (two) times daily.    . RABEprazole (ACIPHEX) 20 MG tablet Take 20 mg by mouth daily.     No facility-administered medications prior to visit.      Allergies:   Clonidine derivatives; Lisinopril; Losartan; Penicillins; Plavix [clopidogrel bisulfate]; Quinapril hcl; Repaglinide; Toprol xl [metoprolol tartrate]; Trovan [alatrofloxacin]; Atorvastatin; Candesartan cilexetil; Clarithromycin; Clopidogrel bisulfate; Ezetimibe; Hydromorphone hcl; Metoclopramide hcl; Morphine and related; Oxycodone-acetaminophen; Paroxetine hcl; Pioglitazone; Valsartan; and Vancomycin   Social History   Social History  . Marital status: Married    Spouse name: N/A  . Number of children: N/A  . Years of education: N/A   Social History Main Topics  . Smoking status: Current Every Day Smoker    Packs/day: 1.00    Years: 43.00    Types: Cigarettes  . Smokeless tobacco: Never Used  . Alcohol use No  . Drug use: No  . Sexual activity: Not Currently   Other Topics Concern  . None   Social History Narrative  . None     Family  History:  The patient's family history includes Anesthesia problems in her mother and sister; Diabetes in her maternal grandfather and sister; Heart Problems in her sister; Heart attack in her father; Lupus in her sister; Neuropathy in her father and sister.   ROS:   Please see the history of present illness.    ROS All other systems reviewed and are negative.   PHYSICAL EXAM:   VS:  BP 132/72   Pulse (!) 48   Ht 5' (1.524 m)   Wt 197 lb (89.4 kg)   BMI 38.47 kg/m    GEN: Well nourished, well developed, in no acute distress  HEENT: normal  Neck: no JVD, carotid bruits, or masses Cardiac: RRR; no murmurs, rubs, or gallops,no edema  Respiratory:  clear to auscultation bilaterally, normal work of breathing GI: soft, nontender, nondistended, + BS MS: no deformity or atrophy  Skin: warm and dry, no rash Neuro:  Alert and Oriented x 3, Strength and sensation are intact Psych: euthymic mood, full affect  Wt Readings from Last 3 Encounters:  09/07/16 197 lb (89.4 kg)  04/26/16 191 lb 4 oz (86.8 kg)  08/31/15 193 lb (87.5 kg)      Studies/Labs Reviewed:   EKG:  EKG is ordered today.  The ekg ordered today demonstrates sinus brady rate 48. LVH with LAD and repolarization abnormality.  Recent Labs: 04/26/2016: BUN 11; Creat 0.73; Hemoglobin 16.0; Platelets 210; Potassium 4.2; Sodium 138   Lipid Panel    Component Value Date/Time   CHOL 287 (HH) 09/27/2006 1500   TRIG 234 (HH) 09/27/2006 1500   TRIG 242 (HH) 04/08/2006 1520   HDL 38.6 (L) 09/27/2006 1500   CHOLHDL 7.4 CALC 09/27/2006 1500   VLDL 47 (H) 09/27/2006 1500   LDLDIRECT 221.2 09/27/2006 1500   Labs dated 01/17/16: A1c 6.9%.  Dated 09/08/15: CMET and CBC normal except glucose 134. TSH normal. Cholesterol 260, triglycerides 282, HDL 37, LDL 167.  Dated 08/21/16: A1c 7.9%. CK 200, CRP 3.2. Glucose 284. Other chemistries are normal. TSH normal.  Additional studies/ records that were reviewed today include:   Holter  monitor 03/21/15: Study Highlights   1. NSR 2. Frequent PVC's with couplets 3. Rare NS atrial tachycardia 4. No sustained brady or tachyarrhythmias     Myoview 07/01/15: Study Highlights    The left ventricular ejection fraction is normal (55-65%).  There was no ST segment deviation noted during stress.  No T wave inversion was noted during stress.  Defect 1: There is a large defect of moderate severity.  Findings consistent with prior myocardial infarction with peri-infarct ischemia.  This is an intermediate risk study.   Large size, moderate intensity, partially reversible (SDS 6) apical and inferolateral wall perfusion defect with normal wall motion. LVEF 56%. This is an intermediate risk study concerning for ischemia in the LCX territory. Clinical correlation is advised.    Cardiac cath/ PCI 08/26/15: Conclusion    Ost RCA to Mid RCA lesion, 100% stenosed. The lesion was previously treated with a bare metal stent greater than two years ago.  1st Diag lesion, 40% stenosed.  Ost LAD to Prox LAD lesion, 20% stenosed.  Prox Cx lesion, 30% stenosed.  3rd Mrg lesion, 99% stenosed. Post intervention, there is a 0% residual stenosis.   1. Severe 2 vessel obstructive CAD    - CTO of the RCA at prior stented segment with left to right collaterals.    - New severe lesion in a large OM3 2. Successful stenting of OM3 with DES x 2.   Plan: Continue DAPT for one year with ASA 81 mg daily and Effient. Risk factor modification. Would continue medical therapy for CTO of RCA. Could consider for CTO PCI but vessel was small in 2002 so long term patency would not be favorable. Anticipate DC in am.      ASSESSMENT:    1. Coronary artery disease involving coronary bypass graft of native heart without angina pectoris   2. Tobacco abuse   3. Essential hypertension   4. Mixed hyperlipidemia      PLAN:  In order of problems listed above:  1. She is doing well. Minimal anginal  symptoms. She is one year s/p stenting of OM3. May stop Effient now. Continue ASA. Intolerant to statins. Continue beta blocker therapy 2. Encourage smoking cessation. Referred to smoking cessation class 3. BP currently well controlled.  4. Not a candidate for statins. Consider PCSK 9 inhibitor. She is on Medicaid but may qualify for patient assistance. Will discuss with her endocrinologist.  5. DM followed by Dr. Jimmye Norman- endocrine.   I will follow up in 6 months.    Medication Adjustments/Labs and Tests Ordered: Current medicines are reviewed at length with the patient today.  Concerns regarding medicines are outlined above.  Medication changes, Labs and Tests ordered today are listed in the Patient Instructions below. Patient Instructions  Continue efforts at smoking cessation  Have Dr. Jimmye Norman consider a PCSK 9 inhibitor ( alirocumab or evolocumab) to lower your cholesterol  You may stop Effient now. Continue your other therapy  I will see you in 6 months    Signed, Avrian Delfavero [place], MD  09/07/2016 11:14 AM    [facility] [address], [place], [place], [address] [phone]

## 2016-09-07 ENCOUNTER — Ambulatory Visit (INDEPENDENT_AMBULATORY_CARE_PROVIDER_SITE_OTHER): Payer: Medicaid Other | Admitting: Cardiology

## 2016-09-07 ENCOUNTER — Encounter: Payer: Self-pay | Admitting: Cardiology

## 2016-09-07 VITALS — BP 132/72 | HR 48 | Ht 60.0 in | Wt 197.0 lb

## 2016-09-07 DIAGNOSIS — I25810 Atherosclerosis of coronary artery bypass graft(s) without angina pectoris: Secondary | ICD-10-CM | POA: Diagnosis not present

## 2016-09-07 DIAGNOSIS — I10 Essential (primary) hypertension: Secondary | ICD-10-CM

## 2016-09-07 DIAGNOSIS — E782 Mixed hyperlipidemia: Secondary | ICD-10-CM

## 2016-09-07 DIAGNOSIS — Z720 Tobacco use: Secondary | ICD-10-CM

## 2016-09-07 NOTE — Patient Instructions (Addendum)
Continue efforts at smoking cessation  Have Dr. Jimmye Norman consider a PCSK 9 inhibitor ( alirocumab or evolocumab) to lower your cholesterol  You may stop Effient now. Continue your other therapy  I will see you in 6 months

## 2017-02-25 IMAGING — NM NM MISC PROCEDURE
3 series · 18 of 18 positions shown · non-contrast
Comparison: none

[Series 1: wbr_s-proj_st stress_(id)_sa · 6.5mm · 6.51mm/px · 6 of 512 frames shown (1 of 2)]
[frame 43/512]
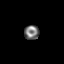
[frame 128/512]
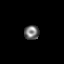
[frame 214/512]
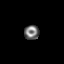
[frame 299/512]
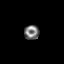
[frame 384/512]
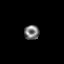
[frame 470/512]
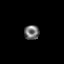

[Series 1: wbr_s-proj_st stress_(id)_sa · 6.5mm · 6.51mm/px · 6 of 64 frames shown (2 of 2)]
[frame 6/64]
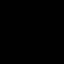
[frame 16/64]
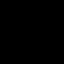
[frame 27/64]
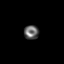
[frame 38/64]
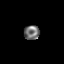
[frame 48/64]
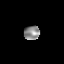
[frame 59/64]
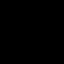

[Series 1: wbr_r-proj_st rest_(id)_sa · 6.5mm · 6.51mm/px · 6 of 64 frames shown]
[frame 6/64]
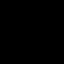
[frame 16/64]
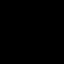
[frame 27/64]
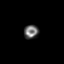
[frame 38/64]
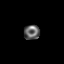
[frame 48/64]
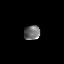
[frame 59/64]
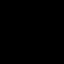

[18 of 18 positions shown; findings below may reference images not displayed]

Canned report from images found in remote index.

Refer to host system for actual result text.

## 2017-05-06 ENCOUNTER — Encounter: Payer: Self-pay | Admitting: Cardiology

## 2017-05-07 ENCOUNTER — Ambulatory Visit: Payer: Self-pay | Admitting: Cardiology

## 2017-05-07 NOTE — Progress Notes (Deleted)
Cardiology Office Note    Date:  05/07/2017   ID:  Courtney Barker, DOB 29-Mar-1956, MRN [id number]  PCP:  Martyn Malay, MD  Cardiologist:  Neda Willenbring [place], MD    History of Present Illness:  Courtney Barker is a 61 y.o. female is seen as a work in today for evaluation of chest pain. She is s/p BMS stenting of the RCA in 2002. Repeat caths in 2002 and 2005 here showed patent stents. She had cardiac cath in July 2015 at [facility] that showed CTO of the RCA and was treated medically. There was moderate disease noted in the third OM and mild disease in the LAD. She has DM, HTN, hyperlipidemia, and ongoing tobacco abuse. She presented in March 2017 with chest pain.She had a Myoview stress test that showed a large partially reversible defect in the apex and inferolateral wall. EF 56%. She then underwent cardiac cath that showed CTO of the RCA at prior stent site. There was a large OM3 with a 99% stenosis that was successfully stented with DES x 2. She was DC on ASA and Effient. She has a number of drug intolerances. She does have a history of PVCs with palpitations that have been well controlled on beta blocker therapy.  On follow up today she states she is doing OK. She is trying to quit smoking. Unable to take Wellbutrin or Chantix. Sometimes has pressure in arms and hands with minimal chest pressure. Hasn't tried Ntg. Symptoms are infrequent and sporadic. She has bad arthritis. Feels "puffy" sometimes.     Past Medical History:  Diagnosis Date  . Anxiety   . Arthritis    "head to toe; neck, hands, arms, knees, feet, ankles" (08/26/2015)  . Asthma   . Cervical spondylosis with myelopathy    s/p C5-6 and C6-7 Anterior cervical discectomy/decompression; C5-6 and C6-7 interbody arthrodesis with local morcellized autograft bone and Actifuse bone graft extender; insertion of interbody prosthesis at C5-6 and C6-7 (Zimmer peek interbody prosthesis); anterior cervical plating from C5-6 and C6-7 with  globus titanium plate 10/18/72  . Chronic lower back pain   . COPD (chronic obstructive pulmonary disease) (HCC)   . Coronary artery disease    s/p BMS x3 RCA '02; repeat cath in '02 and '05 were without significant ISR and normal LV function; pt reported unremarkable nuclear myoview 2011/12  . Depression   . Fibromyalgia   . GERD (gastroesophageal reflux disease)   . Headache(784.0)    "2-3 times/week" (08/26/2015)  . Heart murmur   . Hyperlipidemia    statin intolerant, to be followed by lipid clinic  . Hypertension   . Hypothyroidism   . Increased urinary protein excretion   . Myocardial infarction [facility]) 12/2013   "[place]"  . Neuromuscular disorder (HCC)   . PONV (postoperative nausea and vomiting)   . Sleep apnea    NO STUDY DONE .[name].   . Thyroid nodule   . Tobacco abuse   . Type II diabetes mellitus (HCC)     Past Surgical History:  Procedure Laterality Date  . ABDOMINAL HYSTERECTOMY  2000   "fibroids; took 1 ovary"  . ANTERIOR CERVICAL DECOMP/DISCECTOMY FUSION  08/23/2011   Procedure: ANTERIOR CERVICAL DECOMPRESSION/DISCECTOMY FUSION 2 LEVELS;  Surgeon: Cristi Loron, MD;  Location: [facility] NEURO ORS;  Service: Neurosurgery;  Laterality: N/A;  Anterior Cervical Five-Six/Six-Seven Decompression with Fusion with Interbody Prothesis, Plating, and Bonegraft  . BACK SURGERY    . BOWEL RESECTION  2007   .  CARDIAC CATHETERIZATION N/A 08/26/2015   Procedure: Coronary Stent Intervention;  Surgeon: Lenzy Kerschner M [place], MD;  Location: [facility] INVASIVE CV LAB;  Service: Cardiovascular;  Laterality: N/A;  OM3 and circ  . CARDIAC CATHETERIZATION  2002; 2005   patent stents  . CARDIAC CATHETERIZATION  12/2013   CTO of the RCA ; moderate disease noted in the third OM and mild disease in the LAD; treated medically  . COLONOSCOPY W/ POLYPECTOMY    . CORONARY ANGIOPLASTY WITH STENT PLACEMENT  2002   s/p BMS x3 RCA   . MULTIPLE TOOTH EXTRACTIONS  2000  . TONSILLECTOMY    . TUBAL LIGATION  1981     Current Medications: Outpatient Medications Prior to Visit  Medication Sig Dispense Refill  . acetaminophen (TYLENOL) 500 MG tablet Take 1,000 mg by mouth every 6 (six) hours as needed (pain).     [name] albuterol (PROVENTIL HFA;VENTOLIN HFA) 108 (90 BASE) MCG/ACT inhaler Inhale 2 puffs into the lungs every 6 (six) hours as needed for wheezing or shortness of breath.    [name] aspirin 81 MG tablet Take 81 mg by mouth daily.    . bisacodyl (DULCOLAX) 5 MG EC tablet Take 5 mg by mouth daily as needed. constipation    . bisoprolol-hydrochlorothiazide (ZIAC) 5-6.25 MG tablet Take 1 tablet by mouth 2 (two) times daily.    . fish oil-omega-3 fatty acids 1000 MG capsule Take 1 g by mouth 2 (two) times daily.     . fluticasone (FLONASE) 50 MCG/ACT nasal spray Place 2 sprays into the nose daily as needed for allergies.     . furosemide (LASIX) 40 MG tablet Take 20 mg by mouth daily.     . insulin aspart (NOVOLOG) 100 UNIT/ML injection Inject SQ 35 units w/ meals, 15 units with snacks if sugar >110 (if sugar 80-110 take 8 units with snack) plus correction 1 per 25 over 150    . LANTUS 100 UNIT/ML injection Inject 90 Units into the skin at bedtime.   5  . magnesium oxide (MAG-OX) 400 MG tablet Take 200 mg by mouth 2 (two) times daily.     . metFORMIN (GLUCOPHAGE-XR) 500 MG 24 hr tablet Take 2 tablets (1,000 mg total) by mouth 2 (two) times daily.    [name] Neomycin-Bacitracin-Polymyxin (TRIPLE ANTIBIOTIC EX) Apply 1 application topically as needed (for wound).    . nitroGLYCERIN (NITROSTAT) 0.4 MG SL tablet Place 1 tablet (0.4 mg total) under the tongue every 5 (five) minutes x 3 doses as needed. 25 tablet 5  . NOVOLOG 100 UNIT/ML injection Inject 15-20 Units into the skin 3 (three) times daily. INJECT 15 UNITS INTO THE SKIN WITH BREAKFAST, INJECT 20 UNITS INTO THE SKIN AT LUNCH TIME, INJECT 20 UNITS INTO THE SKIN WITH SUPPER. ANY TIME YOUR BG ARE GREATER THAN 150, INJECT INTO THE SKIN AS DIRECTED PER SLIDING SCALE  5   . omega-3 acid ethyl esters (LOVAZA) 1 g capsule Take 1 capsule (1 g total) by mouth 2 (two) times daily. 60 capsule 5  . polyethylene glycol (MIRALAX / GLYCOLAX) packet Take 17 g by mouth daily as needed. For constipation    . potassium chloride SA (K-DUR,KLOR-CON) 20 MEQ tablet Take 20 mEq by mouth daily.    . RABEprazole (ACIPHEX) 20 MG tablet Take 20 mg by mouth daily.    [name] SYNTHROID 150 MCG tablet Take 150 mcg by mouth daily. TAKE 1 TABLET EVERYDAY EXCEPT TAKE 1 AND 1/2 TABLETS ON SUNDAYS.  5  .  traMADol (ULTRAM) 50 MG tablet Take 50 mg by mouth every 8 (eight) hours as needed (PAIN).    . Vitamin D, Ergocalciferol, (DRISDOL) 50000 UNITS CAPS Take 50,000 Units by mouth every 7 (seven) days.     No facility-administered medications prior to visit.      Allergies:   Clonidine derivatives; Lisinopril; Losartan; Penicillins; Plavix [clopidogrel bisulfate]; Quinapril hcl; Repaglinide; Toprol xl [metoprolol tartrate]; Trovan [alatrofloxacin]; Atorvastatin; Candesartan cilexetil; Clarithromycin; Clopidogrel bisulfate; Ezetimibe; Hydromorphone hcl; Metoclopramide hcl; Morphine and related; Oxycodone-acetaminophen; Paroxetine hcl; Pioglitazone; Valsartan; and Vancomycin   Social History   Socioeconomic History  . Marital status: Married    Spouse name: Not on file  . Number of children: Not on file  . Years of education: Not on file  . Highest education level: Not on file  Social Needs  . Financial resource strain: Not on file  . Food insecurity - worry: Not on file  . Food insecurity - inability: Not on file  . Transportation needs - medical: Not on file  . Transportation needs - non-medical: Not on file  Occupational History  . Not on file  Tobacco Use  . Smoking status: Current Every Day Smoker    Packs/day: 1.00    Years: 43.00    Pack years: 43.00    Types: Cigarettes  . Smokeless tobacco: Never Used  Substance and Sexual Activity  . Alcohol use: No    Alcohol/week: 0.0 oz   . Drug use: No  . Sexual activity: Not Currently  Other Topics Concern  . Not on file  Social History Narrative  . Not on file     Family History:  The patient's family history includes Anesthesia problems in her mother and sister; Diabetes in her maternal grandfather and sister; Heart Problems in her sister; Heart attack in her father; Lupus in her sister; Neuropathy in her father and sister.   ROS:   Please see the history of present illness.    ROS All other systems reviewed and are negative.   PHYSICAL EXAM:   VS:  There were no vitals taken for this visit.   GEN: Well nourished, well developed, in no acute distress  HEENT: normal  Neck: no JVD, carotid bruits, or masses Cardiac: RRR; no murmurs, rubs, or gallops,no edema  Respiratory:  clear to auscultation bilaterally, normal work of breathing GI: soft, nontender, nondistended, + BS MS: no deformity or atrophy  Skin: warm and dry, no rash Neuro:  Alert and Oriented x 3, Strength and sensation are intact Psych: euthymic mood, full affect  Wt Readings from Last 3 Encounters:  09/07/16 197 lb (89.4 kg)  04/26/16 191 lb 4 oz (86.8 kg)  08/31/15 193 lb (87.5 kg)      Studies/Labs Reviewed:   EKG:  EKG is ordered today.  The ekg ordered today demonstrates sinus brady rate 48. LVH with LAD and repolarization abnormality.  Recent Labs: No results found for requested labs within last 8760 hours.   Lipid Panel    Component Value Date/Time   CHOL 287 (HH) 09/27/2006 1500   TRIG 234 (HH) 09/27/2006 1500   TRIG 242 (HH) 04/08/2006 1520   HDL 38.6 (L) 09/27/2006 1500   CHOLHDL 7.4 CALC 09/27/2006 1500   VLDL 47 (H) 09/27/2006 1500   LDLDIRECT 221.2 09/27/2006 1500   Labs dated 01/17/16: A1c 6.9%.  Dated 09/08/15: CMET and CBC normal except glucose 134. TSH normal. Cholesterol 260, triglycerides 282, HDL 37, LDL 167.  Dated 08/21/16: A1c 7.9%. CK  200, CRP 3.2. Glucose 284. Other chemistries are normal. TSH normal.    Additional studies/ records that were reviewed today include:   Holter monitor 03/21/15: Study Highlights   1. NSR 2. Frequent PVC's with couplets 3. Rare NS atrial tachycardia 4. No sustained brady or tachyarrhythmias     Myoview 07/01/15: Study Highlights    The left ventricular ejection fraction is normal (55-65%).  There was no ST segment deviation noted during stress.  No T wave inversion was noted during stress.  Defect 1: There is a large defect of moderate severity.  Findings consistent with prior myocardial infarction with peri-infarct ischemia.  This is an intermediate risk study.   Large size, moderate intensity, partially reversible (SDS 6) apical and inferolateral wall perfusion defect with normal wall motion. LVEF 56%. This is an intermediate risk study concerning for ischemia in the LCX territory. Clinical correlation is advised.    Cardiac cath/ PCI 08/26/15: Conclusion    Ost RCA to Mid RCA lesion, 100% stenosed. The lesion was previously treated with a bare metal stent greater than two years ago.  1st Diag lesion, 40% stenosed.  Ost LAD to Prox LAD lesion, 20% stenosed.  Prox Cx lesion, 30% stenosed.  3rd Mrg lesion, 99% stenosed. Post intervention, there is a 0% residual stenosis.   1. Severe 2 vessel obstructive CAD    - CTO of the RCA at prior stented segment with left to right collaterals.    - New severe lesion in a large OM3 2. Successful stenting of OM3 with DES x 2.   Plan: Continue DAPT for one year with ASA 81 mg daily and Effient. Risk factor modification. Would continue medical therapy for CTO of RCA. Could consider for CTO PCI but vessel was small in 2002 so long term patency would not be favorable. Anticipate DC in am.      ASSESSMENT:    No diagnosis found.   PLAN:  In order of problems listed above:  1. She is doing well. Minimal anginal symptoms. She is one year s/p stenting of OM3. May stop Effient now. Continue ASA.  Intolerant to statins. Continue beta blocker therapy 2. Encourage smoking cessation. Referred to smoking cessation class 3. BP currently well controlled.  4. Not a candidate for statins. Consider PCSK 9 inhibitor. She is on Medicaid but may qualify for patient assistance. Will discuss with her endocrinologist.  5. DM followed by Dr. Jimmye NormanFarmer- endocrine.   I will follow up in 6 months.    Medication Adjustments/Labs and Tests Ordered: Current medicines are reviewed at length with the patient today.  Concerns regarding medicines are outlined above.  Medication changes, Labs and Tests ordered today are listed in the Patient Instructions below. There are no Patient Instructions on file for this visit.   Signed, Ahamed Hofland [place], MD  05/07/2017 12:51 PM    [facility] [address], [place], [place], [id number] [phone]

## 2017-05-13 NOTE — Progress Notes (Signed)
Cardiology Office Note    Date:  05/16/2017   ID:  ZAKARIA FROMER, DOB February 16, 1956, MRN [id number]  PCP:  Martyn Malay, MD  Cardiologist:  Patrese Neal [place], MD    History of Present Illness:  Courtney Barker is a 61 y.o. female seen for follow up CAD. She is s/p BMS stenting of the RCA in 2002. Repeat caths in 2002 and 2005 here showed patent stents. She had cardiac cath in July 2015 at [facility] that showed CTO of the RCA and was treated medically. There was moderate disease noted in the third OM and mild disease in the LAD. She has DM, HTN, hyperlipidemia, and ongoing tobacco abuse.   She presented in March 2017 with chest pain.She had a Myoview stress test that showed a large partially reversible defect in the apex and inferolateral wall. EF 56%. She then underwent cardiac cath that showed CTO of the RCA at prior stent site. There was a large OM3 with a 99% stenosis that was successfully stented with DES x 2. She was DC on ASA and Effient. She has multiple drug intolerances. She does have a history of PVCs with palpitations that have been well controlled on beta blocker therapy.  Recently she developed an upper respiratory infection. On the way to her doctor's office she was coughing very hard. She became flushed, sweating and nauseated. No dizziness or lightheadedness. Reports HR got down into the 40s. Treated with antibiotics. She has developed a pain in left lateral chest radiating around to the back. Relieved with lying on left side. Worse with certain positions. Clearly different than prior cardiac pain. ROS diffusely positive with pain shooting down right leg across knee into lower leg. Nonproductive cough. No palpitations. Nausea resolved. Some discomfort in upper abdomen with cough.    Past Medical History:  Diagnosis Date  . Anxiety   . Arthritis    "head to toe; neck, hands, arms, knees, feet, ankles" (08/26/2015)  . Asthma   . Cervical spondylosis with myelopathy    s/p C5-6  and C6-7 Anterior cervical discectomy/decompression; C5-6 and C6-7 interbody arthrodesis with local morcellized autograft bone and Actifuse bone graft extender; insertion of interbody prosthesis at C5-6 and C6-7 (Zimmer peek interbody prosthesis); anterior cervical plating from C5-6 and C6-7 with globus titanium plate 4/0/98  . Chronic lower back pain   . COPD (chronic obstructive pulmonary disease) (HCC)   . Coronary artery disease    s/p BMS x3 RCA '02; repeat cath in '02 and '05 were without significant ISR and normal LV function; pt reported unremarkable nuclear myoview 2011/12  . Depression   . Fibromyalgia   . GERD (gastroesophageal reflux disease)   . Headache(784.0)    "2-3 times/week" (08/26/2015)  . Heart murmur   . Hyperlipidemia    statin intolerant, to be followed by lipid clinic  . Hypertension   . Hypothyroidism   . Increased urinary protein excretion   . Myocardial infarction [facility]) 12/2013   "[place]"  . Neuromuscular disorder (HCC)   . PONV (postoperative nausea and vomiting)   . Sleep apnea    NO STUDY DONE .[name].   . Thyroid nodule   . Tobacco abuse   . Type II diabetes mellitus (HCC)     Past Surgical History:  Procedure Laterality Date  . ABDOMINAL HYSTERECTOMY  2000   "fibroids; took 1 ovary"  . ANTERIOR CERVICAL DECOMP/DISCECTOMY FUSION  08/23/2011   Procedure: ANTERIOR CERVICAL DECOMPRESSION/DISCECTOMY FUSION 2 LEVELS;  Surgeon: Cristi Loron,  MD;  Location: [facility] NEURO ORS;  Service: Neurosurgery;  Laterality: N/A;  Anterior Cervical Five-Six/Six-Seven Decompression with Fusion with Interbody Prothesis, Plating, and Bonegraft  . BACK SURGERY    . BOWEL RESECTION  2007   . CARDIAC CATHETERIZATION N/A 08/26/2015   Procedure: Coronary Stent Intervention;  Surgeon: Aljean Horiuchi M SwazilandJordan, MD;  [facility];  Service: Cardiovascular;  Laterality: N/A;  OM3 and circ  . CARDIAC CATHETERIZATION  2002; 2005   patent stents  . CARDIAC CATHETERIZATION  12/2013    CTO of the RCA ; moderate disease noted in the third OM and mild disease in the LAD; treated medically  . COLONOSCOPY W/ POLYPECTOMY    . CORONARY ANGIOPLASTY WITH STENT PLACEMENT  2002   s/p BMS x3 RCA   . MULTIPLE TOOTH EXTRACTIONS  2000  . TONSILLECTOMY    . TUBAL LIGATION  1981    Current Medications: Outpatient Medications Prior to Visit  Medication Sig Dispense Refill  . acetaminophen (TYLENOL) 500 MG tablet Take 1,000 mg by mouth every 6 (six) hours as needed (pain).     [name]. albuterol (PROVENTIL HFA;VENTOLIN HFA) 108 (90 BASE) MCG/ACT inhaler Inhale 2 puffs into the lungs every 6 (six) hours as needed for wheezing or shortness of breath.    [name]. aspirin 81 MG tablet Take 81 mg by mouth daily.    . bisacodyl (DULCOLAX) 5 MG EC tablet Take 5 mg by mouth daily as needed. constipation    . bisoprolol-hydrochlorothiazide (ZIAC) 5-6.25 MG tablet Take 1 tablet by mouth 2 (two) times daily.    . fish oil-omega-3 fatty acids 1000 MG capsule Take 1 g by mouth 2 (two) times daily.     . fluticasone (FLONASE) 50 MCG/ACT nasal spray Place 2 sprays into the nose daily as needed for allergies.     . furosemide (LASIX) 40 MG tablet Take 20 mg by mouth daily.     [name]. LANTUS 100 UNIT/ML injection Inject 110 Units into the skin at bedtime.   5  . magnesium oxide (MAG-OX) 400 MG tablet Take 200 mg by mouth 2 (two) times daily.     . metFORMIN (GLUCOPHAGE-XR) 500 MG 24 hr tablet Take 2 tablets (1,000 mg total) by mouth 2 (two) times daily.    [name]. Neomycin-Bacitracin-Polymyxin (TRIPLE ANTIBIOTIC EX) Apply 1 application topically as needed (for wound).    . nitroGLYCERIN (NITROSTAT) 0.4 MG SL tablet Place 1 tablet (0.4 mg total) under the tongue every 5 (five) minutes x 3 doses as needed. 25 tablet 5  . NOVOLOG 100 UNIT/ML injection Inject 15-20 Units into the skin 3 (three) times daily. INJECT 45 UNITS INTO THE SKIN WITH BREAKFAST, INJECT 20 UNITS INTO THE SKIN AT LUNCH TIME, INJECT 55 UNITS INTO THE SKIN WITH  SUPPER. ANY TIME YOUR BG ARE GREATER THAN 150, INJECT INTO THE SKIN AS DIRECTED PER SLIDING SCALE  5  . omega-3 acid ethyl esters (LOVAZA) 1 g capsule Take 1 capsule (1 g total) by mouth 2 (two) times daily. 60 capsule 5  . polyethylene glycol (MIRALAX / GLYCOLAX) packet Take 17 g by mouth daily as needed. For constipation    . potassium chloride SA (K-DUR,KLOR-CON) 20 MEQ tablet Take 20 mEq by mouth daily.    . RABEprazole (ACIPHEX) 20 MG tablet Take 20 mg by mouth daily.    [name]. SYNTHROID 150 MCG tablet Take 150 mcg by mouth daily. TAKE 1 TABLET EVERYDAY EXCEPT TAKE 1 AND 1/2 TABLETS ON SUNDAYS.  5  .  traMADol (ULTRAM) 50 MG tablet Take 50 mg by mouth every 8 (eight) hours as needed (PAIN).    . Vitamin D, Ergocalciferol, (DRISDOL) 50000 UNITS CAPS Take 50,000 Units by mouth every 7 (seven) days.    . insulin aspart (NOVOLOG) 100 UNIT/ML injection Inject SQ 35 units w/ meals, 15 units with snacks if sugar >110 (if sugar 80-110 take 8 units with snack) plus correction 1 per 25 over 150     No facility-administered medications prior to visit.      Allergies:   Clonidine derivatives; Lisinopril; Losartan; Penicillins; Plavix [clopidogrel bisulfate]; Quinapril hcl; Repaglinide; Toprol xl [metoprolol tartrate]; Trovan [alatrofloxacin]; Atorvastatin; Candesartan cilexetil; Clarithromycin; Clopidogrel bisulfate; Ezetimibe; Hydromorphone hcl; Metoclopramide hcl; Morphine and related; Oxycodone-acetaminophen; Paroxetine hcl; Pioglitazone; Valsartan; and Vancomycin   Social History   Socioeconomic History  . Marital status: Married    Spouse name: None  . Number of children: None  . Years of education: None  . Highest education level: None  Social Needs  . Financial resource strain: None  . Food insecurity - worry: None  . Food insecurity - inability: None  . Transportation needs - medical: None  . Transportation needs - non-medical: None  Occupational History  . None  Tobacco Use  . Smoking  status: Current Every Day Smoker    Packs/day: 1.00    Years: 43.00    Pack years: 43.00    Types: Cigarettes  . Smokeless tobacco: Never Used  Substance and Sexual Activity  . Alcohol use: No    Alcohol/week: 0.0 oz  . Drug use: No  . Sexual activity: Not Currently  Other Topics Concern  . None  Social History Narrative  . None     Family History:  The patient's family history includes Anesthesia problems in her mother and sister; Diabetes in her maternal grandfather and sister; Heart Problems in her sister; Heart attack in her father; Lupus in her sister; Neuropathy in her father and sister.   ROS:   Please see the history of present illness.    ROS All other systems reviewed and are negative.   PHYSICAL EXAM:   VS:  BP (!) 150/78   Pulse (!) 50   Ht 5' (1.524 m)   Wt 189 lb (85.7 kg)   SpO2 94%   BMI 36.91 kg/m    GENERAL:  Well appearing, obese WF  HEENT:  PERRL, EOMI, sclera are clear. Oropharynx is clear. NECK:  No jugular venous distention, carotid upstroke brisk and symmetric, no bruits, no thyromegaly or adenopathy LUNGS:  Clear to auscultation bilaterally CHEST:  Some tenderness to palpation over left anterior and posterior ribs.  HEART:  RRR,  PMI not displaced or sustained,S1 and S2 within normal limits, no S3, no S4: no clicks, no rubs, no murmurs ABD:  Soft, nontender. BS +, no masses or bruits. No hepatomegaly, no splenomegaly EXT:  2 + pulses throughout, no edema, no cyanosis no clubbing SKIN:  Warm and dry.  No rashes NEURO:  Alert and oriented x 3. Cranial nerves II through XII intact. PSYCH:  Cognitively intact    Wt Readings from Last 3 Encounters:  05/16/17 189 lb (85.7 kg)  09/07/16 197 lb (89.4 kg)  04/26/16 191 lb 4 oz (86.8 kg)      Studies/Labs Reviewed:   EKG:  EKG is ordered today.  The ekg ordered today demonstrates sinus brady rate 51. LVH with LAD and repolarization abnormality. This is unchanged. I have personally reviewed and  interpreted this  study.   Recent Labs: No results found for requested labs within last 8760 hours.   Lipid Panel    Component Value Date/Time   CHOL 287 (HH) 09/27/2006 1500   TRIG 234 (HH) 09/27/2006 1500   TRIG 242 (HH) 04/08/2006 1520   HDL 38.6 (L) 09/27/2006 1500   CHOLHDL 7.4 CALC 09/27/2006 1500   VLDL 47 (H) 09/27/2006 1500   LDLDIRECT 221.2 09/27/2006 1500   Labs dated 01/17/16: A1c 6.9%.  Dated 09/08/15: CMET and CBC normal except glucose 134. TSH normal. Cholesterol 260, triglycerides 282, HDL 37, LDL 167.  Dated 08/21/16: A1c 7.9%. CK 200, CRP 3.2. Glucose 284. Other chemistries are normal. TSH normal.  Dated 04/25/17: magnesium 1.6. TSH 0.374, glucose 180. Other chemistries normal. Cholesterol 252, triglycerides 236, HDL 39, LDL 166. A1c 7.3%.   Additional studies/ records that were reviewed today include:   CXR dated 05/08/17: NAD  Holter monitor 03/21/15: Study Highlights   1. NSR 2. Frequent PVC's with couplets 3. Rare NS atrial tachycardia 4. No sustained brady or tachyarrhythmias     Myoview 07/01/15: Study Highlights    The left ventricular ejection fraction is normal (55-65%).  There was no ST segment deviation noted during stress.  No T wave inversion was noted during stress.  Defect 1: There is a large defect of moderate severity.  Findings consistent with prior myocardial infarction with peri-infarct ischemia.  This is an intermediate risk study.   Large size, moderate intensity, partially reversible (SDS 6) apical and inferolateral wall perfusion defect with normal wall motion. LVEF 56%. This is an intermediate risk study concerning for ischemia in the LCX territory. Clinical correlation is advised.    Cardiac cath/ PCI 08/26/15: Conclusion    Ost RCA to Mid RCA lesion, 100% stenosed. The lesion was previously treated with a bare metal stent greater than two years ago.  1st Diag lesion, 40% stenosed.  Ost LAD to Prox LAD lesion, 20%  stenosed.  Prox Cx lesion, 30% stenosed.  3rd Mrg lesion, 99% stenosed. Post intervention, there is a 0% residual stenosis.   1. Severe 2 vessel obstructive CAD    - CTO of the RCA at prior stented segment with left to right collaterals.    - New severe lesion in a large OM3 2. Successful stenting of OM3 with DES x 2.   Plan: Continue DAPT for one year with ASA 81 mg daily and Effient. Risk factor modification. Would continue medical therapy for CTO of RCA. Could consider for CTO PCI but vessel was small in 2002 so long term patency would not be favorable. Anticipate DC in am.      ASSESSMENT:    1. Coronary artery disease involving coronary bypass graft of native heart without angina pectoris   2. Mixed hyperlipidemia   3. Essential hypertension   4. Tobacco abuse   5. Musculoskeletal chest pain      PLAN:  In order of problems listed above:  1. She has atypical chest pain and history and exam are more consistent with musculoskeletal chest pain. Recommend analgesic with Tylenol. I anticipate this will improve with resolution of cough. I have a low suspicion of ACS.  2. Encourage smoking cessation. Referred to smoking cessation class 3. 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 Intolerant of Zetia. Consider PCSK 9 inhibitor. She is now on [employer] so will refer to lipid clinic to see if we can start PCSK 9 inhibitor.  4. DM  followed by Dr. Jimmye Norman- endocrine.   I will follow up in 6 months.    Medication Adjustments/Labs and Tests Ordered: Current medicines are reviewed at length with the patient today.  Concerns regarding medicines are outlined above.  Medication changes, Labs and Tests ordered today are listed in the Patient Instructions below. Patient Instructions  We will schedule you to see our Pharm D to consider cholesterol lowering therapy  Continue your other therapy  I will see you in 3 months.    Signed, Bryant Saye Swaz[place], MD   05/16/2017 1:14 PM    [facility] [address] [place], [place], [address] [phone]

## 2017-05-16 ENCOUNTER — Encounter: Payer: Self-pay | Admitting: Cardiology

## 2017-05-16 ENCOUNTER — Ambulatory Visit: Payer: BLUE CROSS/BLUE SHIELD | Admitting: Cardiology

## 2017-05-16 VITALS — BP 150/78 | HR 50 | Ht 60.0 in | Wt 189.0 lb

## 2017-05-16 DIAGNOSIS — R0789 Other chest pain: Secondary | ICD-10-CM

## 2017-05-16 DIAGNOSIS — E782 Mixed hyperlipidemia: Secondary | ICD-10-CM | POA: Diagnosis not present

## 2017-05-16 DIAGNOSIS — Z720 Tobacco use: Secondary | ICD-10-CM | POA: Diagnosis not present

## 2017-05-16 DIAGNOSIS — I25810 Atherosclerosis of coronary artery bypass graft(s) without angina pectoris: Secondary | ICD-10-CM

## 2017-05-16 DIAGNOSIS — I10 Essential (primary) hypertension: Secondary | ICD-10-CM | POA: Diagnosis not present

## 2017-05-16 NOTE — Patient Instructions (Signed)
We will schedule you to see our Pharm D to consider cholesterol lowering therapy  Continue your other therapy  I will see you in 3 months.

## 2017-05-28 ENCOUNTER — Ambulatory Visit: Payer: BLUE CROSS/BLUE SHIELD

## 2017-08-10 NOTE — Progress Notes (Deleted)
Cardiology Office Note    Date:  08/10/2017   ID:  VONNE JOANIS, DOB Nov 11, 1955, MRN [id number]  PCP:  Martyn Malay, MD  Cardiologist:  Rene Sizelove [place], MD    History of Present Illness:  Courtney Barker is a 62 y.o. female seen for follow up CAD. She is s/p BMS stenting of the RCA in 2002. Repeat caths in 2002 and 2005 here showed patent stents. She had cardiac cath in July 2015 at [facility] that showed CTO of the RCA and was treated medically. There was moderate disease noted in the third OM and mild disease in the LAD. She has DM, HTN, hyperlipidemia, and ongoing tobacco abuse.   She presented in March 2017 with chest pain.She had a Myoview stress test that showed a large partially reversible defect in the apex and inferolateral wall. EF 56%. She then underwent cardiac cath that showed CTO of the RCA at prior stent site. There was a large OM3 with a 99% stenosis that was successfully stented with DES x 2. She was DC on ASA and Effient. She has multiple drug intolerances. She does have a history of PVCs with palpitations that have been well controlled on beta blocker therapy.    Past Medical History:  Diagnosis Date  . Anxiety   . Arthritis    "head to toe; neck, hands, arms, knees, feet, ankles" (08/26/2015)  . Asthma   . Cervical spondylosis with myelopathy    s/p C5-6 and C6-7 Anterior cervical discectomy/decompression; C5-6 and C6-7 interbody arthrodesis with local morcellized autograft bone and Actifuse bone graft extender; insertion of interbody prosthesis at C5-6 and C6-7 (Zimmer peek interbody prosthesis); anterior cervical plating from C5-6 and C6-7 with globus titanium plate 11/24/10  . Chronic lower back pain   . COPD (chronic obstructive pulmonary disease) (HCC)   . Coronary artery disease    s/p BMS x3 RCA '02; repeat cath in '02 and '05 were without significant ISR and normal LV function; pt reported unremarkable nuclear myoview 2011/12  . Depression   . Fibromyalgia    . GERD (gastroesophageal reflux disease)   . Headache(784.0)    "2-3 times/week" (08/26/2015)  . Heart murmur   . Hyperlipidemia    statin intolerant, to be followed by lipid clinic  . Hypertension   . Hypothyroidism   . Increased urinary protein excretion   . Myocardial infarction [facility]) 12/2013   "[place]"  . Neuromuscular disorder (HCC)   . PONV (postoperative nausea and vomiting)   . Sleep apnea    NO STUDY DONE .[name].   . Thyroid nodule   . Tobacco abuse   . Type II diabetes mellitus (HCC)     Past Surgical History:  Procedure Laterality Date  . ABDOMINAL HYSTERECTOMY  2000   "fibroids; took 1 ovary"  . ANTERIOR CERVICAL DECOMP/DISCECTOMY FUSION  08/23/2011   Procedure: ANTERIOR CERVICAL DECOMPRESSION/DISCECTOMY FUSION 2 LEVELS;  Surgeon: Cristi Loron, MD;  Location: [facility] NEURO ORS;  Service: Neurosurgery;  Laterality: N/A;  Anterior Cervical Five-Six/Six-Seven Decompression with Fusion with Interbody Prothesis, Plating, and Bonegraft  . BACK SURGERY    . BOWEL RESECTION  2007   . CARDIAC CATHETERIZATION N/A 08/26/2015   Procedure: Coronary Stent Intervention;  Surgeon: Idora Brosious M [place], MD;  Location: [facility] INVASIVE CV LAB;  Service: Cardiovascular;  Laterality: N/A;  OM3 and circ  . CARDIAC CATHETERIZATION  2002; 2005   patent stents  . CARDIAC CATHETERIZATION  12/2013   CTO of the RCA ; moderate  disease noted in the third OM and mild disease in the LAD; treated medically  . COLONOSCOPY W/ POLYPECTOMY    . CORONARY ANGIOPLASTY WITH STENT PLACEMENT  2002   s/p BMS x3 RCA   . MULTIPLE TOOTH EXTRACTIONS  2000  . TONSILLECTOMY    . TUBAL LIGATION  1981    Current Medications: Outpatient Medications Prior to Visit  Medication Sig Dispense Refill  . acetaminophen (TYLENOL) 500 MG tablet Take 1,000 mg by mouth every 6 (six) hours as needed (pain).     [name] albuterol (PROVENTIL HFA;VENTOLIN HFA) 108 (90 BASE) MCG/ACT inhaler Inhale 2 puffs into the lungs every 6 (six) hours as needed  for wheezing or shortness of breath.    [name] aspirin 81 MG tablet Take 81 mg by mouth daily.    . bisacodyl (DULCOLAX) 5 MG EC tablet Take 5 mg by mouth daily as needed. constipation    . bisoprolol-hydrochlorothiazide (ZIAC) 5-6.25 MG tablet Take 1 tablet by mouth 2 (two) times daily.    . fish oil-omega-3 fatty acids 1000 MG capsule Take 1 g by mouth 2 (two) times daily.     . fluticasone (FLONASE) 50 MCG/ACT nasal spray Place 2 sprays into the nose daily as needed for allergies.     . furosemide (LASIX) 40 MG tablet Take 20 mg by mouth daily.     [name] LANTUS 100 UNIT/ML injection Inject 110 Units into the skin at bedtime.   5  . magnesium oxide (MAG-OX) 400 MG tablet Take 200 mg by mouth 2 (two) times daily.     . metFORMIN (GLUCOPHAGE-XR) 500 MG 24 hr tablet Take 2 tablets (1,000 mg total) by mouth 2 (two) times daily.    [name] Neomycin-Bacitracin-Polymyxin (TRIPLE ANTIBIOTIC EX) Apply 1 application topically as needed (for wound).    . nitroGLYCERIN (NITROSTAT) 0.4 MG SL tablet Place 1 tablet (0.4 mg total) under the tongue every 5 (five) minutes x 3 doses as needed. 25 tablet 5  . NOVOLOG 100 UNIT/ML injection Inject 15-20 Units into the skin 3 (three) times daily. INJECT 45 UNITS INTO THE SKIN WITH BREAKFAST, INJECT 20 UNITS INTO THE SKIN AT LUNCH TIME, INJECT 55 UNITS INTO THE SKIN WITH SUPPER. ANY TIME YOUR BG ARE GREATER THAN 150, INJECT INTO THE SKIN AS DIRECTED PER SLIDING SCALE  5  . omega-3 acid ethyl esters (LOVAZA) 1 g capsule Take 1 capsule (1 g total) by mouth 2 (two) times daily. 60 capsule 5  . polyethylene glycol (MIRALAX / GLYCOLAX) packet Take 17 g by mouth daily as needed. For constipation    . potassium chloride SA (K-DUR,KLOR-CON) 20 MEQ tablet Take 20 mEq by mouth daily.    . RABEprazole (ACIPHEX) 20 MG tablet Take 20 mg by mouth daily.    [name] SYNTHROID 150 MCG tablet Take 150 mcg by mouth daily. TAKE 1 TABLET EVERYDAY EXCEPT TAKE 1 AND 1/2 TABLETS ON SUNDAYS.  5  . traMADol (ULTRAM)  50 MG tablet Take 50 mg by mouth every 8 (eight) hours as needed (PAIN).    . Vitamin D, Ergocalciferol, (DRISDOL) 50000 UNITS CAPS Take 50,000 Units by mouth every 7 (seven) days.     No facility-administered medications prior to visit.      Allergies:   Clonidine derivatives; Lisinopril; Losartan; Penicillins; Plavix [clopidogrel bisulfate]; Quinapril hcl; Repaglinide; Toprol xl [metoprolol tartrate]; Trovan [alatrofloxacin]; Atorvastatin; Candesartan cilexetil; Clarithromycin; Clopidogrel bisulfate; Ezetimibe; Hydromorphone hcl; Metoclopramide hcl; Morphine and related; Oxycodone-acetaminophen; Paroxetine hcl; Pioglitazone; Valsartan; and Vancomycin  Social History   Socioeconomic History  . Marital status: Married    Spouse name: Not on file  . Number of children: Not on file  . Years of education: Not on file  . Highest education level: Not on file  Social Needs  . Financial resource strain: Not on file  . Food insecurity - worry: Not on file  . Food insecurity - inability: Not on file  . Transportation needs - medical: Not on file  . Transportation needs - non-medical: Not on file  Occupational History  . Not on file  Tobacco Use  . Smoking status: Current Every Day Smoker    Packs/day: 1.00    Years: 43.00    Pack years: 43.00    Types: Cigarettes  . Smokeless tobacco: Never Used  Substance and Sexual Activity  . Alcohol use: No    Alcohol/week: 0.0 oz  . Drug use: No  . Sexual activity: Not Currently  Other Topics Concern  . Not on file  Social History Narrative  . Not on file     Family History:  The patient's family history includes Anesthesia problems in her mother and sister; Diabetes in her maternal grandfather and sister; Heart Problems in her sister; Heart attack in her father; Lupus in her sister; Neuropathy in her father and sister.   ROS:   Please see the history of present illness.    ROS All other systems reviewed and are negative.   PHYSICAL  EXAM:   VS:  There were no vitals taken for this visit.   GENERAL:  Well appearing, obese WF  HEENT:  PERRL, EOMI, sclera are clear. Oropharynx is clear. NECK:  No jugular venous distention, carotid upstroke brisk and symmetric, no bruits, no thyromegaly or adenopathy LUNGS:  Clear to auscultation bilaterally CHEST:  Some tenderness to palpation over left anterior and posterior ribs.  HEART:  RRR,  PMI not displaced or sustained,S1 and S2 within normal limits, no S3, no S4: no clicks, no rubs, no murmurs ABD:  Soft, nontender. BS +, no masses or bruits. No hepatomegaly, no splenomegaly EXT:  2 + pulses throughout, no edema, no cyanosis no clubbing SKIN:  Warm and dry.  No rashes NEURO:  Alert and oriented x 3. Cranial nerves II through XII intact. PSYCH:  Cognitively intact    Wt Readings from Last 3 Encounters:  05/16/17 189 lb (85.7 kg)  09/07/16 197 lb (89.4 kg)  04/26/16 191 lb 4 oz (86.8 kg)      Studies/Labs Reviewed:   EKG:  EKG is ordered today.  The ekg ordered today demonstrates sinus brady rate 51. LVH with LAD and repolarization abnormality. This is unchanged. I have personally reviewed and interpreted this study.   Recent Labs: No results found for requested labs within last 8760 hours.   Lipid Panel    Component Value Date/Time   CHOL 287 (HH) 09/27/2006 1500   TRIG 234 (HH) 09/27/2006 1500   TRIG 242 (HH) 04/08/2006 1520   HDL 38.6 (L) 09/27/2006 1500   CHOLHDL 7.4 CALC 09/27/2006 1500   VLDL 47 (H) 09/27/2006 1500   LDLDIRECT 221.2 09/27/2006 1500   Labs dated 01/17/16: A1c 6.9%.  Dated 09/08/15: CMET and CBC normal except glucose 134. TSH normal. Cholesterol 260, triglycerides 282, HDL 37, LDL 167.  Dated 08/21/16: A1c 7.9%. CK 200, CRP 3.2. Glucose 284. Other chemistries are normal. TSH normal.  Dated 04/25/17: magnesium 1.6. TSH 0.374, glucose 180. Other chemistries normal. Cholesterol 252, triglycerides 236,  HDL 39, LDL 166. A1c 7.3%.   Additional  studies/ records that were reviewed today include:   CXR dated 05/08/17: NAD  Holter monitor 03/21/15: Study Highlights   1. NSR 2. Frequent PVC's with couplets 3. Rare NS atrial tachycardia 4. No sustained brady or tachyarrhythmias     Myoview 07/01/15: Study Highlights    The left ventricular ejection fraction is normal (55-65%).  There was no ST segment deviation noted during stress.  No T wave inversion was noted during stress.  Defect 1: There is a large defect of moderate severity.  Findings consistent with prior myocardial infarction with peri-infarct ischemia.  This is an intermediate risk study.   Large size, moderate intensity, partially reversible (SDS 6) apical and inferolateral wall perfusion defect with normal wall motion. LVEF 56%. This is an intermediate risk study concerning for ischemia in the LCX territory. Clinical correlation is advised.    Cardiac cath/ PCI 08/26/15: Conclusion    Ost RCA to Mid RCA lesion, 100% stenosed. The lesion was previously treated with a bare metal stent greater than two years ago.  1st Diag lesion, 40% stenosed.  Ost LAD to Prox LAD lesion, 20% stenosed.  Prox Cx lesion, 30% stenosed.  3rd Mrg lesion, 99% stenosed. Post intervention, there is a 0% residual stenosis.   1. Severe 2 vessel obstructive CAD    - CTO of the RCA at prior stented segment with left to right collaterals.    - New severe lesion in a large OM3 2. Successful stenting of OM3 with DES x 2.   Plan: Continue DAPT for one year with ASA 81 mg daily and Effient. Risk factor modification. Would continue medical therapy for CTO of RCA. Could consider for CTO PCI but vessel was small in 2002 so long term patency would not be favorable. Anticipate DC in am.      ASSESSMENT:    No diagnosis found.   PLAN:  In order of problems listed above:  1. She has atypical chest pain and history and exam are more consistent with musculoskeletal chest pain.  Recommend analgesic with Tylenol. I anticipate this will improve with resolution of cough. I have a low suspicion of ACS.  2. Encourage smoking cessation. Referred to smoking cessation class 3. 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 Intolerant of Zetia. Consider PCSK 9 inhibitor. She is now on [employer] so will refer to lipid clinic to see if we can start PCSK 9 inhibitor.  4. DM followed by Dr. Jimmye Norman- endocrine.   I will follow up in 6 months.    Medication Adjustments/Labs and Tests Ordered: Current medicines are reviewed at length with the patient today.  Concerns regarding medicines are outlined above.  Medication changes, Labs and Tests ordered today are listed in the Patient Instructions below. There are no Patient Instructions on file for this visit.   Signed, Kalani Sthilaire [place], MD  08/10/2017 7:25 AM    [facility] [address] [place], [place], [address] [phone]

## 2017-08-15 ENCOUNTER — Ambulatory Visit: Payer: BLUE CROSS/BLUE SHIELD | Admitting: Cardiology

## 2017-10-07 ENCOUNTER — Telehealth: Payer: Self-pay | Admitting: Cardiology

## 2017-10-07 NOTE — Telephone Encounter (Signed)
New Message:    Pt says her gout is bothering her.She wants to know if she can take Advil for it please.

## 2017-10-07 NOTE — Telephone Encounter (Signed)
Returned call to patient.She stated she had gout in right foot.Advised to call PCP.Stated she will be going to a Urgent Care when son gets off work.Advised ok to take 2 Advil until she goes to Urgent Care.

## 2017-10-08 ENCOUNTER — Telehealth: Payer: Self-pay

## 2017-10-08 NOTE — Telephone Encounter (Signed)
Received call from patient.She stated she went to Urgent Care yesterday for gout.She was prescribed Indomethacin 25 mg.Stated her B/P has been elevated since she started taking.She continues to be in a lot of pain.Advised pain can cause B/P to be elevated.Advised to monitor and B/P should get better when pain stops.Advised to call back if B/P continues to be elevated.

## 2017-10-23 ENCOUNTER — Ambulatory Visit: Payer: BLUE CROSS/BLUE SHIELD | Admitting: Cardiology

## 2017-11-29 NOTE — Progress Notes (Deleted)
Cardiology Office Note    Date:  11/29/2017   ID:  Courtney Barker, DOB 08-01-1955, MRN [id number]  PCP:  Martyn Malay, MD  Cardiologist:  Peter Swaziland, MD    History of Present Illness:  Courtney Barker is a 62 y.o. female seen for follow up CAD. She is s/p BMS stenting of the RCA in 2002. Repeat caths in 2002 and 2005 here showed patent stents. She had cardiac cath in July 2015 at [facility] that showed CTO of the RCA and was treated medically. There was moderate disease noted in the third OM and mild disease in the LAD. She has DM, HTN, hyperlipidemia, and ongoing tobacco abuse.   She presented in March 2017 with chest pain.She had a Myoview stress test that showed a large partially reversible defect in the apex and inferolateral wall. EF 56%. She then underwent cardiac cath that showed CTO of the RCA at prior stent site. There was a large OM3 with a 99% stenosis that was successfully stented with DES x 2. She was DC on ASA and Effient. She has multiple drug intolerances. She does have a history of PVCs with palpitations that have been well controlled on beta blocker therapy.  On her last visit she was referred to smoking cessation class. Also recommended evaluation in our lipid clinic given intolerance to statins and Zetia. This was not done.    Past Medical History:  Diagnosis Date  . Anxiety   . Arthritis    "head to toe; neck, hands, arms, knees, feet, ankles" (08/26/2015)  . Asthma   . Cervical spondylosis with myelopathy    s/p C5-6 and C6-7 Anterior cervical discectomy/decompression; C5-6 and C6-7 interbody arthrodesis with local morcellized autograft bone and Actifuse bone graft extender; insertion of interbody prosthesis at C5-6 and C6-7 (Zimmer peek interbody prosthesis); anterior cervical plating from C5-6 and C6-7 with globus titanium plate 06/21/76  . Chronic lower back pain   . COPD (chronic obstructive pulmonary disease) (HCC)   . Coronary artery disease    s/p BMS x3  RCA '02; repeat cath in '02 and '05 were without significant ISR and normal LV function; pt reported unremarkable nuclear myoview 2011/12  . Depression   . Fibromyalgia   . GERD (gastroesophageal reflux disease)   . Headache(784.0)    "2-3 times/week" (08/26/2015)  . Heart murmur   . Hyperlipidemia    statin intolerant, to be followed by lipid clinic  . Hypertension   . Hypothyroidism   . Increased urinary protein excretion   . Myocardial infarction [facility]) 12/2013   "[place]"  . Neuromuscular disorder (HCC)   . PONV (postoperative nausea and vomiting)   . Sleep apnea    NO STUDY DONE .[name].   . Thyroid nodule   . Tobacco abuse   . Type II diabetes mellitus (HCC)     Past Surgical History:  Procedure Laterality Date  . ABDOMINAL HYSTERECTOMY  2000   "fibroids; took 1 ovary"  . ANTERIOR CERVICAL DECOMP/DISCECTOMY FUSION  08/23/2011   Procedure: ANTERIOR CERVICAL DECOMPRESSION/DISCECTOMY FUSION 2 LEVELS;  Surgeon: Cristi Loron, MD;  Location: [facility] NEURO ORS;  Service: Neurosurgery;  Laterality: N/A;  Anterior Cervical Five-Six/Six-Seven Decompression with Fusion with Interbody Prothesis, Plating, and Bonegraft  . BACK SURGERY    . BOWEL RESECTION  2007   . CARDIAC CATHETERIZATION N/A 08/26/2015   Procedure: Coronary Stent Intervention;  Surgeon: Peter M Swaziland, MD;  Location: [facility] INVASIVE CV LAB;  Service: Cardiovascular;  Laterality: N/A;  OM3 and circ  . CARDIAC CATHETERIZATION  2002; 2005   patent stents  . CARDIAC CATHETERIZATION  12/2013   CTO of the RCA ; moderate disease noted in the third OM and mild disease in the LAD; treated medically  . COLONOSCOPY W/ POLYPECTOMY    . CORONARY ANGIOPLASTY WITH STENT PLACEMENT  2002   s/p BMS x3 RCA   . MULTIPLE TOOTH EXTRACTIONS  2000  . TONSILLECTOMY    . TUBAL LIGATION  1981    Current Medications: Outpatient Medications Prior to Visit  Medication Sig Dispense Refill  . acetaminophen (TYLENOL) 500 MG tablet Take 1,000 mg by mouth  every 6 (six) hours as needed (pain).     [name] albuterol (PROVENTIL HFA;VENTOLIN HFA) 108 (90 BASE) MCG/ACT inhaler Inhale 2 puffs into the lungs every 6 (six) hours as needed for wheezing or shortness of breath.    [name] aspirin 81 MG tablet Take 81 mg by mouth daily.    . bisacodyl (DULCOLAX) 5 MG EC tablet Take 5 mg by mouth daily as needed. constipation    . bisoprolol-hydrochlorothiazide (ZIAC) 5-6.25 MG tablet Take 1 tablet by mouth 2 (two) times daily.    . fish oil-omega-3 fatty acids 1000 MG capsule Take 1 g by mouth 2 (two) times daily.     . fluticasone (FLONASE) 50 MCG/ACT nasal spray Place 2 sprays into the nose daily as needed for allergies.     . furosemide (LASIX) 40 MG tablet Take 20 mg by mouth daily.     [name] LANTUS 100 UNIT/ML injection Inject 110 Units into the skin at bedtime.   5  . magnesium oxide (MAG-OX) 400 MG tablet Take 200 mg by mouth 2 (two) times daily.     . metFORMIN (GLUCOPHAGE-XR) 500 MG 24 hr tablet Take 2 tablets (1,000 mg total) by mouth 2 (two) times daily.    [name] Neomycin-Bacitracin-Polymyxin (TRIPLE ANTIBIOTIC EX) Apply 1 application topically as needed (for wound).    . nitroGLYCERIN (NITROSTAT) 0.4 MG SL tablet Place 1 tablet (0.4 mg total) under the tongue every 5 (five) minutes x 3 doses as needed. 25 tablet 5  . NOVOLOG 100 UNIT/ML injection Inject 15-20 Units into the skin 3 (three) times daily. INJECT 45 UNITS INTO THE SKIN WITH BREAKFAST, INJECT 20 UNITS INTO THE SKIN AT LUNCH TIME, INJECT 55 UNITS INTO THE SKIN WITH SUPPER. ANY TIME YOUR BG ARE GREATER THAN 150, INJECT INTO THE SKIN AS DIRECTED PER SLIDING SCALE  5  . omega-3 acid ethyl esters (LOVAZA) 1 g capsule Take 1 capsule (1 g total) by mouth 2 (two) times daily. 60 capsule 5  . polyethylene glycol (MIRALAX / GLYCOLAX) packet Take 17 g by mouth daily as needed. For constipation    . potassium chloride SA (K-DUR,KLOR-CON) 20 MEQ tablet Take 20 mEq by mouth daily.    . RABEprazole (ACIPHEX) 20 MG tablet  Take 20 mg by mouth daily.    [name] SYNTHROID 150 MCG tablet Take 150 mcg by mouth daily. TAKE 1 TABLET EVERYDAY EXCEPT TAKE 1 AND 1/2 TABLETS ON SUNDAYS.  5  . traMADol (ULTRAM) 50 MG tablet Take 50 mg by mouth every 8 (eight) hours as needed (PAIN).    . Vitamin D, Ergocalciferol, (DRISDOL) 50000 UNITS CAPS Take 50,000 Units by mouth every 7 (seven) days.     No facility-administered medications prior to visit.      Allergies:   Clonidine derivatives; Lisinopril; Losartan; Penicillins; Plavix [clopidogrel bisulfate]; Quinapril hcl; Repaglinide; Toprol  xl [metoprolol tartrate]; Trovan [alatrofloxacin]; Atorvastatin; Candesartan cilexetil; Clarithromycin; Clopidogrel bisulfate; Ezetimibe; Hydromorphone hcl; Metoclopramide hcl; Morphine and related; Oxycodone-acetaminophen; Paroxetine hcl; Pioglitazone; Valsartan; and Vancomycin   Social History   Socioeconomic History  . Marital status: Married    Spouse name: Not on file  . Number of children: Not on file  . Years of education: Not on file  . Highest education level: Not on file  Occupational History  . Not on file  Social Needs  . Financial resource strain: Not on file  . Food insecurity:    Worry: Not on file    Inability: Not on file  . Transportation needs:    Medical: Not on file    Non-medical: Not on file  Tobacco Use  . Smoking status: Current Every Day Smoker    Packs/day: 1.00    Years: 43.00    Pack years: 43.00    Types: Cigarettes  . Smokeless tobacco: Never Used  Substance and Sexual Activity  . Alcohol use: No    Alcohol/week: 0.0 oz  . Drug use: No  . Sexual activity: Not Currently  Lifestyle  . Physical activity:    Days per week: Not on file    Minutes per session: Not on file  . Stress: Not on file  Relationships  . Social connections:    Talks on phone: Not on file    Gets together: Not on file    Attends religious service: Not on file    Active member of club or organization: Not on file    Attends  meetings of clubs or organizations: Not on file    Relationship status: Not on file  Other Topics Concern  . Not on file  Social History Narrative  . Not on file     Family History:  The patient's family history includes Anesthesia problems in her mother and sister; Diabetes in her maternal grandfather and sister; Heart Problems in her sister; Heart attack in her father; Lupus in her sister; Neuropathy in her father and sister.   ROS:   Please see the history of present illness.    ROS All other systems reviewed and are negative.   PHYSICAL EXAM:   VS:  There were no vitals taken for this visit.   GENERAL:  Well appearing, obese WF  HEENT:  PERRL, EOMI, sclera are clear. Oropharynx is clear. NECK:  No jugular venous distention, carotid upstroke brisk and symmetric, no bruits, no thyromegaly or adenopathy LUNGS:  Clear to auscultation bilaterally CHEST:  Some tenderness to palpation over left anterior and posterior ribs.  HEART:  RRR,  PMI not displaced or sustained,S1 and S2 within normal limits, no S3, no S4: no clicks, no rubs, no murmurs ABD:  Soft, nontender. BS +, no masses or bruits. No hepatomegaly, no splenomegaly EXT:  2 + pulses throughout, no edema, no cyanosis no clubbing SKIN:  Warm and dry.  No rashes NEURO:  Alert and oriented x 3. Cranial nerves II through XII intact. PSYCH:  Cognitively intact    Wt Readings from Last 3 Encounters:  05/16/17 189 lb (85.7 kg)  09/07/16 197 lb (89.4 kg)  04/26/16 191 lb 4 oz (86.8 kg)      Studies/Labs Reviewed:   EKG:  EKG is ordered today.  The ekg ordered today demonstrates sinus brady rate 51. LVH with LAD and repolarization abnormality. This is unchanged. I have personally reviewed and interpreted this study.   Recent Labs: No results found for requested labs  within last 8760 hours.   Lipid Panel    Component Value Date/Time   CHOL 287 (HH) 09/27/2006 1500   TRIG 234 (HH) 09/27/2006 1500   TRIG 242 (HH)  04/08/2006 1520   HDL 38.6 (L) 09/27/2006 1500   CHOLHDL 7.4 CALC 09/27/2006 1500   VLDL 47 (H) 09/27/2006 1500   LDLDIRECT 221.2 09/27/2006 1500   Labs dated 01/17/16: A1c 6.9%.  Dated 09/08/15: CMET and CBC normal except glucose 134. TSH normal. Cholesterol 260, triglycerides 282, HDL 37, LDL 167.  Dated 08/21/16: A1c 7.9%. CK 200, CRP 3.2. Glucose 284. Other chemistries are normal. TSH normal.  Dated 04/25/17: magnesium 1.6. TSH 0.374, glucose 180. Other chemistries normal. Cholesterol 252, triglycerides 236, HDL 39, LDL 166. A1c 7.3%.   Additional studies/ records that were reviewed today include:   CXR dated 05/08/17: NAD  Holter monitor 03/21/15: Study Highlights   1. NSR 2. Frequent PVC's with couplets 3. Rare NS atrial tachycardia 4. No sustained brady or tachyarrhythmias     Myoview 07/01/15: Study Highlights    The left ventricular ejection fraction is normal (55-65%).  There was no ST segment deviation noted during stress.  No T wave inversion was noted during stress.  Defect 1: There is a large defect of moderate severity.  Findings consistent with prior myocardial infarction with peri-infarct ischemia.  This is an intermediate risk study.   Large size, moderate intensity, partially reversible (SDS 6) apical and inferolateral wall perfusion defect with normal wall motion. LVEF 56%. This is an intermediate risk study concerning for ischemia in the LCX territory. Clinical correlation is advised.    Cardiac cath/ PCI 08/26/15: Conclusion    Ost RCA to Mid RCA lesion, 100% stenosed. The lesion was previously treated with a bare metal stent greater than two years ago.  1st Diag lesion, 40% stenosed.  Ost LAD to Prox LAD lesion, 20% stenosed.  Prox Cx lesion, 30% stenosed.  3rd Mrg lesion, 99% stenosed. Post intervention, there is a 0% residual stenosis.   1. Severe 2 vessel obstructive CAD    - CTO of the RCA at prior stented segment with left to right  collaterals.    - New severe lesion in a large OM3 2. Successful stenting of OM3 with DES x 2.   Plan: Continue DAPT for one year with ASA 81 mg daily and Effient. Risk factor modification. Would continue medical therapy for CTO of RCA. Could consider for CTO PCI but vessel was small in 2002 so long term patency would not be favorable. Anticipate DC in am.      ASSESSMENT:    No diagnosis found.   PLAN:  In order of problems listed above:  1. She has atypical chest pain and history and exam are more consistent with musculoskeletal chest pain. Recommend analgesic with Tylenol. I anticipate this will improve with resolution of cough. I have a low suspicion of ACS.  2. Encourage smoking cessation. Referred to smoking cessation class 3. 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 Intolerant of Zetia. Consider PCSK 9 inhibitor. She is now on [employer] so will refer to lipid clinic to see if we can start PCSK 9 inhibitor.  4. DM followed by Dr. Jimmye Norman- endocrine.   I will follow up in 6 months.    Medication Adjustments/Labs and Tests Ordered: Current medicines are reviewed at length with the patient today.  Concerns regarding medicines are outlined above.  Medication changes, Labs and Tests  ordered today are listed in the Patient Instructions below. There are no Patient Instructions on file for this visit.   Signed, Peter Swaziland, MD  11/29/2017 4:41 PM    [facility] [address], [place], [place], [address] [phone]

## 2017-12-02 ENCOUNTER — Ambulatory Visit: Payer: BLUE CROSS/BLUE SHIELD | Admitting: Cardiology

## 2017-12-17 ENCOUNTER — Other Ambulatory Visit: Payer: Self-pay | Admitting: Internal Medicine

## 2017-12-25 ENCOUNTER — Ambulatory Visit: Payer: BLUE CROSS/BLUE SHIELD | Admitting: Adult Health

## 2018-03-29 NOTE — Progress Notes (Signed)
Cardiology Office Note    Date:  03/31/2018   ID:  Courtney Barker, DOB Mar 02, 1956, MRN [id number]  PCP:  Martyn Malay, MD  Cardiologist:  Peter Swaziland, MD    History of Present Illness:  Courtney Barker is a 62 y.o. female seen for follow up CAD. She is s/p BMS stenting of the RCA in 2002. Repeat caths in 2002 and 2005 here showed patent stents. She had cardiac cath in July 2015 at [facility] that showed CTO of the RCA and was treated medically. There was moderate disease noted in the third OM and mild disease in the LAD. She has DM, HTN, hyperlipidemia, and ongoing tobacco abuse.   She presented in March 2017 with chest pain.She had a Myoview stress test that showed a large partially reversible defect in the apex and inferolateral wall. EF 56%. She then underwent cardiac cath that showed CTO of the RCA at prior stent site. There was a large OM3 with a 99% stenosis that was successfully stented with DES x 2. She was DC on ASA and Effient. She has multiple drug intolerances. She does have a history of PVCs with palpitations that have been well controlled on beta blocker therapy.  On follow up today she has a number of complaints. She notes pain in the back of her neck radiating down her right arm. She has pain in her mid back that will go down in her legs including thighs and calves. Notes gout pain and uses Indocin periodically. Has only used sl Ntg three times for some SOB and chest pressure. She notes she has panic attacks and this has made it difficult for her to quit smoking. She is followed by Dr. Jimmye Norman for diabetes.     Past Medical History:  Diagnosis Date  . Anxiety   . Arthritis    "head to toe; neck, hands, arms, knees, feet, ankles" (08/26/2015)  . Asthma   . Cervical spondylosis with myelopathy    s/p C5-6 and C6-7 Anterior cervical discectomy/decompression; C5-6 and C6-7 interbody arthrodesis with local morcellized autograft bone and Actifuse bone graft extender;  insertion of interbody prosthesis at C5-6 and C6-7 (Zimmer peek interbody prosthesis); anterior cervical plating from C5-6 and C6-7 with globus titanium plate 08/21/04  . Chronic lower back pain   . COPD (chronic obstructive pulmonary disease) (HCC)   . Coronary artery disease    s/p BMS x3 RCA '02; repeat cath in '02 and '05 were without significant ISR and normal LV function; pt reported unremarkable nuclear myoview 2011/12  . Depression   . Fibromyalgia   . GERD (gastroesophageal reflux disease)   . Headache(784.0)    "2-3 times/week" (08/26/2015)  . Heart murmur   . Hyperlipidemia    statin intolerant, to be followed by lipid clinic  . Hypertension   . Hypothyroidism   . Increased urinary protein excretion   . Myocardial infarction [facility]) 12/2013   "[place]"  . Neuromuscular disorder (HCC)   . PONV (postoperative nausea and vomiting)   . Sleep apnea    NO STUDY DONE .[name].   . Thyroid nodule   . Tobacco abuse   . Type II diabetes mellitus (HCC)     Past Surgical History:  Procedure Laterality Date  . ABDOMINAL HYSTERECTOMY  2000   "fibroids; took 1 ovary"  . ANTERIOR CERVICAL DECOMP/DISCECTOMY FUSION  08/23/2011   Procedure: ANTERIOR CERVICAL DECOMPRESSION/DISCECTOMY FUSION 2 LEVELS;  Surgeon: Cristi Loron, MD;  Location: [facility];  Service:  Neurosurgery;  Laterality: N/A;  Anterior Cervical Five-Six/Six-Seven Decompression with Fusion with Interbody Prothesis, Plating, and Bonegraft  . BACK SURGERY    . BOWEL RESECTION  2007   . CARDIAC CATHETERIZATION N/A 08/26/2015   Procedure: Coronary Stent Intervention;  Surgeon: Peter M Swaziland, MD;  Location: [facility] INVASIVE CV LAB;  Service: Cardiovascular;  Laterality: N/A;  OM3 and circ  . CARDIAC CATHETERIZATION  2002; 2005   patent stents  . CARDIAC CATHETERIZATION  12/2013   CTO of the RCA ; moderate disease noted in the third OM and mild disease in the LAD; treated medically  . COLONOSCOPY W/ POLYPECTOMY    . CORONARY ANGIOPLASTY  WITH STENT PLACEMENT  2002   s/p BMS x3 RCA   . MULTIPLE TOOTH EXTRACTIONS  2000  . TONSILLECTOMY    . TUBAL LIGATION  1981    Current Medications: Outpatient Medications Prior to Visit  Medication Sig Dispense Refill  . acetaminophen (TYLENOL) 500 MG tablet Take 1,000 mg by mouth every 6 (six) hours as needed (pain).     [name] albuterol (PROVENTIL HFA;VENTOLIN HFA) 108 (90 BASE) MCG/ACT inhaler Inhale 2 puffs into the lungs every 6 (six) hours as needed for wheezing or shortness of breath.    . allopurinol (ZYLOPRIM) 100 MG tablet Take 1 tablet by mouth 2 (two) times daily.  11  . aspirin 81 MG tablet Take 81 mg by mouth daily.    . bisacodyl (DULCOLAX) 5 MG EC tablet Take 5 mg by mouth daily as needed. constipation    . fish oil-omega-3 fatty acids 1000 MG capsule Take 1 g by mouth 2 (two) times daily.     . fluticasone (FLONASE) 50 MCG/ACT nasal spray Place 2 sprays into the nose daily as needed for allergies.     . furosemide (LASIX) 40 MG tablet Take 20 mg by mouth daily.     . indomethacin (INDOCIN) 25 MG capsule Take 1 capsule by mouth 2 (two) times daily as needed.  0  . LANTUS 100 UNIT/ML injection Inject 110 Units into the skin at bedtime.   5  . magnesium oxide (MAG-OX) 400 MG tablet Take 200 mg by mouth 2 (two) times daily.     . metFORMIN (GLUCOPHAGE-XR) 500 MG 24 hr tablet Take 2 tablets (1,000 mg total) by mouth 2 (two) times daily.    [name] Neomycin-Bacitracin-Polymyxin (TRIPLE ANTIBIOTIC EX) Apply 1 application topically as needed (for wound).    . nitroGLYCERIN (NITROSTAT) 0.4 MG SL tablet DISSOLVE 1 TABLET UNDER THE TONGUE EVERY 5 MINUTES UP TO 3 DOSES 25 tablet 6  . NOVOLOG 100 UNIT/ML injection Inject 15-20 Units into the skin 3 (three) times daily. INJECT 45 UNITS INTO THE SKIN WITH BREAKFAST, INJECT 20 UNITS INTO THE SKIN AT LUNCH TIME, INJECT 55 UNITS INTO THE SKIN WITH SUPPER. ANY TIME YOUR BG ARE GREATER THAN 150, INJECT INTO THE SKIN AS DIRECTED PER SLIDING SCALE  5  .  polyethylene glycol (MIRALAX / GLYCOLAX) packet Take 17 g by mouth daily as needed. For constipation    . potassium chloride SA (K-DUR,KLOR-CON) 20 MEQ tablet Take 20 mEq by mouth daily.    [name] SYNTHROID 150 MCG tablet Take 150 mcg by mouth daily. TAKE 1 TABLET EVERYDAY EXCEPT TAKE 1 AND 1/2 TABLETS ON SUNDAYS.  5  . traMADol (ULTRAM) 50 MG tablet Take 50 mg by mouth every 8 (eight) hours as needed (PAIN).    . Vitamin D, Ergocalciferol, (DRISDOL) 50000 UNITS CAPS Take 50,000 Units  by mouth every 7 (seven) days.    . bisoprolol-hydrochlorothiazide (ZIAC) 5-6.25 MG tablet Take 1 tablet by mouth 2 (two) times daily.    . RABEprazole (ACIPHEX) 20 MG tablet Take 20 mg by mouth daily.    [name] omega-3 acid ethyl esters (LOVAZA) 1 g capsule Take 1 capsule (1 g total) by mouth 2 (two) times daily. 60 capsule 5   No facility-administered medications prior to visit.      Allergies:   Clonidine derivatives; Lisinopril; Losartan; Penicillins; Plavix [clopidogrel bisulfate]; Quinapril hcl; Repaglinide; Toprol xl [metoprolol tartrate]; Trovan [alatrofloxacin]; Atorvastatin; Candesartan cilexetil; Clarithromycin; Clopidogrel bisulfate; Ezetimibe; Hydromorphone hcl; Metoclopramide hcl; Morphine and related; Oxycodone-acetaminophen; Paroxetine hcl; Pioglitazone; Valsartan; and Vancomycin   Social History   Socioeconomic History  . Marital status: Married    Spouse name: Not on file  . Number of children: Not on file  . Years of education: Not on file  . Highest education level: Not on file  Occupational History  . Not on file  Social Needs  . Financial resource strain: Not on file  . Food insecurity:    Worry: Not on file    Inability: Not on file  . Transportation needs:    Medical: Not on file    Non-medical: Not on file  Tobacco Use  . Smoking status: Current Every Day Smoker    Packs/day: 1.00    Years: 43.00    Pack years: 43.00    Types: Cigarettes  . Smokeless tobacco: Never Used  Substance  and Sexual Activity  . Alcohol use: No    Alcohol/week: 0.0 standard drinks  . Drug use: No  . Sexual activity: Not Currently  Lifestyle  . Physical activity:    Days per week: Not on file    Minutes per session: Not on file  . Stress: Not on file  Relationships  . Social connections:    Talks on phone: Not on file    Gets together: Not on file    Attends religious service: Not on file    Active member of club or organization: Not on file    Attends meetings of clubs or organizations: Not on file    Relationship status: Not on file  Other Topics Concern  . Not on file  Social History Narrative  . Not on file     Family History:  The patient's family history includes Anesthesia problems in her mother and sister; Diabetes in her maternal grandfather and sister; Heart Problems in her sister; Heart attack in her father; Lupus in her sister; Neuropathy in her father and sister.   ROS:   Please see the history of present illness.    ROS All other systems reviewed and are negative.   PHYSICAL EXAM:   VS:  BP 122/80 (BP Location: Left Arm, Patient Position: Sitting, Cuff Size: Normal)   Pulse (!) 49   Ht 5' (1.524 m)   Wt 197 lb 9.6 oz (89.6 kg)   BMI 38.59 kg/m    GENERAL:  Well appearing obese WF walks with a cane. HEENT:  PERRL, EOMI, sclera are clear. Oropharynx is clear. NECK:  No jugular venous distention, carotid upstroke brisk and symmetric, no bruits, no thyromegaly or adenopathy LUNGS:  Clear to auscultation bilaterally CHEST:  Unremarkable HEART:  RRR,  PMI not displaced or sustained,S1 and S2 within normal limits, no S3, no S4: no clicks, no rubs, no murmurs ABD:  Soft, nontender. BS +, no masses or bruits. No hepatomegaly, no splenomegaly  EXT:  1 + pulses throughout, no edema, no cyanosis no clubbing SKIN:  Warm and dry.  No rashes NEURO:  Alert and oriented x 3. Cranial nerves II through XII intact. PSYCH:  Cognitively intact      Wt Readings from Last 3  Encounters:  03/31/18 197 lb 9.6 oz (89.6 kg)  05/16/17 189 lb (85.7 kg)  09/07/16 197 lb (89.4 kg)      Studies/Labs Reviewed:   EKG:  EKG is ordered today.  The ekg ordered today demonstrates sinus brady rate 49. LVH with repolarization abnormality. LAFB. This is unchanged. I have personally reviewed and interpreted this study.   Recent Labs: No results found for requested labs within last 8760 hours.   Lipid Panel    Component Value Date/Time   CHOL 287 (HH) 09/27/2006 1500   TRIG 234 (HH) 09/27/2006 1500   TRIG 242 (HH) 04/08/2006 1520   HDL 38.6 (L) 09/27/2006 1500   CHOLHDL 7.4 CALC 09/27/2006 1500   VLDL 47 (H) 09/27/2006 1500   LDLDIRECT 221.2 09/27/2006 1500   Labs dated 01/17/16: A1c 6.9%.  Dated 09/08/15: CMET and CBC normal except glucose 134. TSH normal. Cholesterol 260, triglycerides 282, HDL 37, LDL 167.  Dated 08/21/16: A1c 7.9%. CK 200, CRP 3.2. Glucose 284. Other chemistries are normal. TSH normal.  Dated 04/25/17: magnesium 1.6. TSH 0.374, glucose 180. Other chemistries normal. Cholesterol 252, triglycerides 236, HDL 39, LDL 166. A1c 7.3%.   Additional studies/ records that were reviewed today include:   CXR dated 05/08/17: NAD  Holter monitor 03/21/15: Study Highlights   1. NSR 2. Frequent PVC's with couplets 3. Rare NS atrial tachycardia 4. No sustained brady or tachyarrhythmias     Myoview 07/01/15: Study Highlights    The left ventricular ejection fraction is normal (55-65%).  There was no ST segment deviation noted during stress.  No T wave inversion was noted during stress.  Defect 1: There is a large defect of moderate severity.  Findings consistent with prior myocardial infarction with peri-infarct ischemia.  This is an intermediate risk study.   Large size, moderate intensity, partially reversible (SDS 6) apical and inferolateral wall perfusion defect with normal wall motion. LVEF 56%. This is an intermediate risk study concerning for  ischemia in the LCX territory. Clinical correlation is advised.    Cardiac cath/ PCI 08/26/15: Conclusion    Ost RCA to Mid RCA lesion, 100% stenosed. The lesion was previously treated with a bare metal stent greater than two years ago.  1st Diag lesion, 40% stenosed.  Ost LAD to Prox LAD lesion, 20% stenosed.  Prox Cx lesion, 30% stenosed.  3rd Mrg lesion, 99% stenosed. Post intervention, there is a 0% residual stenosis.   1. Severe 2 vessel obstructive CAD    - CTO of the RCA at prior stented segment with left to right collaterals.    - New severe lesion in a large OM3 2. Successful stenting of OM3 with DES x 2.   Plan: Continue DAPT for one year with ASA 81 mg daily and Effient. Risk factor modification. Would continue medical therapy for CTO of RCA. Could consider for CTO PCI but vessel was small in 2002 so long term patency would not be favorable. Anticipate DC in am.      ASSESSMENT:    1. Coronary artery disease involving coronary bypass graft of native heart without angina pectoris   2. Mixed hyperlipidemia   3. Essential hypertension   4. Pain in both lower extremities  5. Tobacco abuse      PLAN:  In order of problems listed above:  1. CAD s/p stenting of the third OM in March 2017. CTO of the RCA. She has stable angina. Continue ASA 81 mg daily. On beta blocker. Intolerant of statins and Zetia. Continue medical therapy 2. Tobacco abuse. Encourage smoking cessation. May need to work with primary care to control panic attacks and anxiety. 3. HLD. 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 Intolerant of Zetia. Recommend PCSK 9 inhibitor. Will refer to our lipid clinic. Update lab work today 4. DM followed by Dr. Jimmye Norman- endocrine.  5.   Leg pain. Suspect this is mostly due to musculoskeletal pain and gout but she is at high risk for PAD. Will arrange for LE arterial dopplers.   I will follow up in 6 months.    Medication  Adjustments/Labs and Tests Ordered: Current medicines are reviewed at length with the patient today.  Concerns regarding medicines are outlined above.  Medication changes, Labs and Tests ordered today are listed in the Patient Instructions below. Patient Instructions  We will arrange for lower extremity arterial dopplers  We will have you see our Pharm D about a new cholesterol medication.      Signed, Peter Swaziland, MD  03/31/2018 9:36 AM    [facility] [address], [place], [place], [address] [phone]

## 2018-03-31 ENCOUNTER — Encounter: Payer: Self-pay | Admitting: Cardiology

## 2018-03-31 ENCOUNTER — Ambulatory Visit: Payer: BLUE CROSS/BLUE SHIELD | Admitting: Cardiology

## 2018-03-31 VITALS — BP 122/80 | HR 49 | Ht 60.0 in | Wt 197.6 lb

## 2018-03-31 DIAGNOSIS — M79604 Pain in right leg: Secondary | ICD-10-CM | POA: Diagnosis not present

## 2018-03-31 DIAGNOSIS — E782 Mixed hyperlipidemia: Secondary | ICD-10-CM | POA: Diagnosis not present

## 2018-03-31 DIAGNOSIS — I25810 Atherosclerosis of coronary artery bypass graft(s) without angina pectoris: Secondary | ICD-10-CM

## 2018-03-31 DIAGNOSIS — I10 Essential (primary) hypertension: Secondary | ICD-10-CM

## 2018-03-31 DIAGNOSIS — Z23 Encounter for immunization: Secondary | ICD-10-CM | POA: Diagnosis not present

## 2018-03-31 DIAGNOSIS — M79605 Pain in left leg: Secondary | ICD-10-CM

## 2018-03-31 DIAGNOSIS — Z720 Tobacco use: Secondary | ICD-10-CM

## 2018-03-31 NOTE — Patient Instructions (Addendum)
We will arrange for lower extremity arterial dopplers  We will have you see our Pharm D about a new cholesterol medication.   Medication Instructions:  Continue same meds If you need a refill on your cardiac medications before your next appointment, please call your pharmacy.   Lab work: Cmet,lipid panel,cbc,tsh,a1c today If you have labs (blood work) drawn today and your tests are completely normal, you will receive your results only by: [name] MyChart Message (if you have MyChart) OR . A paper copy in the mail If you have any lab test that is abnormal or we need to change your treatment, we will call you to review the results.  Testing/Procedures: Schedule lower ext arterial dopplers  Follow-Up: At [facility], you and your health needs are our priority.  As part of our continuing mission to provide you with exceptional heart care, we have created designated Provider Care Teams.  These Care Teams include your primary Cardiologist (physician) and Advanced Practice Providers (APPs -  Physician Assistants and Nurse Practitioners) who all work together to provide you with the care you need, when you need it. . Schedule appointment with pharmacy . Follow up with Dr.Jordan in 6 months call 2 months before to schedule

## 2018-04-01 LAB — CBC WITH DIFFERENTIAL/PLATELET
Basophils Absolute: 0.1 10*3/uL (ref 0.0–0.2)
Basos: 1 %
EOS (ABSOLUTE): 0.2 10*3/uL (ref 0.0–0.4)
Eos: 2 %
Hematocrit: 41.8 % (ref 34.0–46.6)
Hemoglobin: 15.1 g/dL (ref 11.1–15.9)
Immature Grans (Abs): 0 10*3/uL (ref 0.0–0.1)
Immature Granulocytes: 0 %
Lymphocytes Absolute: 2.7 10*3/uL (ref 0.7–3.1)
Lymphs: 30 %
MCH: 34.5 pg — ABNORMAL HIGH (ref 26.6–33.0)
MCHC: 36.1 g/dL — ABNORMAL HIGH (ref 31.5–35.7)
MCV: 95 fL (ref 79–97)
Monocytes Absolute: 0.8 10*3/uL (ref 0.1–0.9)
Monocytes: 9 %
Neutrophils Absolute: 5.2 10*3/uL (ref 1.4–7.0)
Neutrophils: 58 %
Platelets: 221 10*3/uL (ref 150–450)
RBC: 4.38 x10E6/uL (ref 3.77–5.28)
RDW: 13.1 % (ref 12.3–15.4)
WBC: 8.9 10*3/uL (ref 3.4–10.8)

## 2018-04-01 LAB — COMPREHENSIVE METABOLIC PANEL
ALT: 41 IU/L — ABNORMAL HIGH (ref 0–32)
AST: 36 IU/L (ref 0–40)
Albumin/Globulin Ratio: 1.7 (ref 1.2–2.2)
Albumin: 4.4 g/dL (ref 3.6–4.8)
Alkaline Phosphatase: 66 IU/L (ref 39–117)
BUN/Creatinine Ratio: 17 (ref 12–28)
BUN: 14 mg/dL (ref 8–27)
Bilirubin Total: 0.5 mg/dL (ref 0.0–1.2)
CO2: 23 mmol/L (ref 20–29)
Calcium: 9.6 mg/dL (ref 8.7–10.3)
Chloride: 97 mmol/L (ref 96–106)
Creatinine, Ser: 0.83 mg/dL (ref 0.57–1.00)
GFR calc Af Amer: 89 mL/min/{1.73_m2} (ref >59)
GFR calc non Af Amer: 77 mL/min/{1.73_m2} (ref >59)
Globulin, Total: 2.6 g/dL (ref 1.5–4.5)
Glucose: 172 mg/dL — ABNORMAL HIGH (ref 65–99)
Potassium: 4.2 mmol/L (ref 3.5–5.2)
Sodium: 137 mmol/L (ref 134–144)
Total Protein: 7 g/dL (ref 6.0–8.5)

## 2018-04-01 LAB — LIPID PANEL W/O CHOL/HDL RATIO
Cholesterol, Total: 240 mg/dL — ABNORMAL HIGH (ref 100–199)
HDL: 38 mg/dL — ABNORMAL LOW (ref >39)
LDL Calculated: 163 mg/dL — ABNORMAL HIGH (ref 0–99)
Triglycerides: 195 mg/dL — ABNORMAL HIGH (ref 0–149)
VLDL Cholesterol Cal: 39 mg/dL (ref 5–40)

## 2018-04-01 LAB — HEMOGLOBIN A1C
Est. average glucose Bld gHb Est-mCnc: 160 mg/dL
Hgb A1c MFr Bld: 7.2 % — ABNORMAL HIGH (ref 4.8–5.6)

## 2018-04-01 LAB — TSH: TSH: 3.67 u[IU]/mL (ref 0.450–4.500)

## 2018-04-09 ENCOUNTER — Other Ambulatory Visit: Payer: Self-pay | Admitting: Cardiology

## 2018-04-09 DIAGNOSIS — M79604 Pain in right leg: Secondary | ICD-10-CM

## 2018-04-09 DIAGNOSIS — M79605 Pain in left leg: Secondary | ICD-10-CM

## 2018-04-09 DIAGNOSIS — Z720 Tobacco use: Secondary | ICD-10-CM

## 2018-04-09 DIAGNOSIS — I10 Essential (primary) hypertension: Secondary | ICD-10-CM

## 2018-04-09 DIAGNOSIS — I25810 Atherosclerosis of coronary artery bypass graft(s) without angina pectoris: Secondary | ICD-10-CM

## 2018-04-09 DIAGNOSIS — E782 Mixed hyperlipidemia: Secondary | ICD-10-CM

## 2018-04-22 ENCOUNTER — Encounter (HOSPITAL_COMMUNITY): Payer: BLUE CROSS/BLUE SHIELD

## 2018-04-24 ENCOUNTER — Ambulatory Visit (HOSPITAL_COMMUNITY)
Admission: RE | Admit: 2018-04-24 | Payer: BLUE CROSS/BLUE SHIELD | Source: Ambulatory Visit | Attending: Cardiology | Admitting: Cardiology

## 2018-04-24 ENCOUNTER — Ambulatory Visit: Payer: BLUE CROSS/BLUE SHIELD

## 2018-05-01 ENCOUNTER — Ambulatory Visit: Payer: BLUE CROSS/BLUE SHIELD

## 2018-05-01 ENCOUNTER — Encounter (HOSPITAL_COMMUNITY): Payer: BLUE CROSS/BLUE SHIELD

## 2018-05-08 ENCOUNTER — Ambulatory Visit (HOSPITAL_COMMUNITY)
Admission: RE | Admit: 2018-05-08 | Discharge: 2018-05-08 | Disposition: A | Discharge status: Home / Self Care | Payer: BLUE CROSS/BLUE SHIELD | Source: Ambulatory Visit | Attending: Cardiovascular Disease | Admitting: Cardiovascular Disease

## 2018-05-08 ENCOUNTER — Ambulatory Visit (INDEPENDENT_AMBULATORY_CARE_PROVIDER_SITE_OTHER): Payer: BLUE CROSS/BLUE SHIELD | Admitting: Pharmacist

## 2018-05-08 DIAGNOSIS — Z720 Tobacco use: Secondary | ICD-10-CM | POA: Insufficient documentation

## 2018-05-08 DIAGNOSIS — M79605 Pain in left leg: Secondary | ICD-10-CM | POA: Diagnosis present

## 2018-05-08 DIAGNOSIS — I25810 Atherosclerosis of coronary artery bypass graft(s) without angina pectoris: Secondary | ICD-10-CM | POA: Diagnosis present

## 2018-05-08 DIAGNOSIS — E782 Mixed hyperlipidemia: Secondary | ICD-10-CM | POA: Insufficient documentation

## 2018-05-08 DIAGNOSIS — I10 Essential (primary) hypertension: Secondary | ICD-10-CM | POA: Diagnosis present

## 2018-05-08 DIAGNOSIS — M79604 Pain in right leg: Secondary | ICD-10-CM | POA: Insufficient documentation

## 2018-05-08 MED ORDER — ROSUVASTATIN CALCIUM 5 MG PO TABS: ORAL_TABLET | ORAL | 0 refills | Status: DC

## 2018-05-08 NOTE — Patient Instructions (Addendum)
Lipid Clinic (pharmacist) Tennille Montelongo/Kristin [phone]  *RESUME taking fish-oil 1000mg  daily* *START taking crestor (rosuvastatin) 5mg  every Friday for 2 weeks, then increase to every Monday and Friday if able to tolerate*  *Repeat fasting blood work in 8 weeks*  Cholesterol Cholesterol is a fat. Your body needs a small amount of cholesterol. Cholesterol (plaque) may build up in your blood vessels (arteries). That makes you more likely to have a heart attack or stroke. You cannot feel your cholesterol level. Having a blood test is the only way to find out if your level is high. Keep your test results. Work with your doctor to keep your cholesterol at a good level. What do the results mean?  Total cholesterol is how much cholesterol is in your blood.  LDL is bad cholesterol. This is the type that can build up. Try to have low LDL.  HDL is good cholesterol. It cleans your blood vessels and carries LDL away. Try to have high HDL.  Triglycerides are fat that the body can store or burn for energy. What are good levels of cholesterol?  Total cholesterol below 200.  LDL below 100 is good for people who have health risks. LDL below 70 is good for people who have very high risks.  HDL above 40 is good. It is best to have HDL of 60 or higher.  Triglycerides below 150. How can I lower my cholesterol? Diet Follow your diet program as told by your doctor.  Choose fish, white meat chicken, or [place] that is roasted or baked. Try not to eat red meat, fried foods, sausage, or lunch meats.  Eat lots of fresh fruits and vegetables.  Choose whole grains, beans, pasta, potatoes, and cereals.  Choose olive oil, corn oil, or canola oil. Only use small amounts.  Try not to eat butter, mayonnaise, shortening, or palm kernel oils.  Try not to eat foods with trans fats.  Choose low-fat or nonfat dairy foods. ? Drink skim or nonfat milk. ? Eat low-fat or nonfat yogurt and cheeses. ? Try not to  drink whole milk or cream. ? Try not to eat ice cream, egg yolks, or full-fat cheeses.  Healthy desserts include angel food cake, ginger snaps, animal crackers, hard candy, popsicles, and low-fat or nonfat frozen yogurt. Try not to eat pastries, cakes, pies, and cookies.  Exercise Follow your exercise program as told by your doctor.  Be more active. Try gardening, walking, and taking the stairs.  Ask your doctor about ways that you can be more active.  Medicine  Take over-the-counter and prescription medicines only as told by your doctor. This information is not intended to replace advice given to you by your health care provider. Make sure you discuss any questions you have with your health care provider. Document Released: 08/31/2008 Document Revised: 01/04/2016 Document Reviewed: 12/15/2015 Elsevier Interactive Patient Education  [employer].

## 2018-05-08 NOTE — Progress Notes (Signed)
Patient ID: Courtney Barker                 DOB: 02/07/1956                    MRN: [id number]     HPI: Courtney Barker is a 62 y.o. female patient referred to lipid clinic by Dr Swaziland. PMH is significant for CAD s/p stent placement, hypertension, hyperlipidemia, neuropathy, and ongoing tobacco abuse.  Referred to pharmacist lipid clinic for potential PCSK9i initiation. Patient presents today for lipid management and expressed interest in using Crestor instead of injectable products. She is afraid of using something that "last 14 days in her body" and she doesn't know if she will get a "bad reaction" to it.  Current Medications: none; not taking fish-oil at this time  Intolerances:  Atorvastatin - muscle pain (unable to ambulate) and increased liver enzymes Ezetimibe 10mg  daily  - GI bleed  LDL goal: < 70mg /dL  Diet: keep working on low fat diet  Exercise: minimal due to use of cain and legs/back pain  Family History: The patient's family history includes Anesthesia problems in her mother and sister; Diabetes in her maternal grandfather and sister; Heart Problems in her sister; Heart attack in her father; Lupus in her sister; Neuropathy in her father and sister.   Social History: current smoker, denies alcohol intake  Labs: 03/31/18: CHO 240; TG 195; HDL 38; LDL-c 163 (no therapy)  Past Medical History:  Diagnosis Date  . Anxiety   . Arthritis    "head to toe; neck, hands, arms, knees, feet, ankles" (08/26/2015)  . Asthma   . Cervical spondylosis with myelopathy    s/p C5-6 and C6-7 Anterior cervical discectomy/decompression; C5-6 and C6-7 interbody arthrodesis with local morcellized autograft bone and Actifuse bone graft extender; insertion of interbody prosthesis at C5-6 and C6-7 (Zimmer peek interbody prosthesis); anterior cervical plating from C5-6 and C6-7 with globus titanium plate 0/9/38  . Chronic lower back pain   . COPD (chronic obstructive pulmonary disease) (HCC)   . Coronary  artery disease    s/p BMS x3 RCA '02; repeat cath in '02 and '05 were without significant ISR and normal LV function; pt reported unremarkable nuclear myoview 2011/12  . Depression   . Fibromyalgia   . GERD (gastroesophageal reflux disease)   . Headache(784.0)    "2-3 times/week" (08/26/2015)  . Heart murmur   . Hyperlipidemia    statin intolerant, to be followed by lipid clinic  . Hypertension   . Hypothyroidism   . Increased urinary protein excretion   . Myocardial infarction [facility]) 12/2013   "[place]"  . Neuromuscular disorder (HCC)   . PONV (postoperative nausea and vomiting)   . Sleep apnea    NO STUDY DONE .[name].   . Thyroid nodule   . Tobacco abuse   . Type II diabetes mellitus (HCC)     Current Outpatient Medications on File Prior to Visit  Medication Sig Dispense Refill  . acetaminophen (TYLENOL) 500 MG tablet Take 1,000 mg by mouth every 6 (six) hours as needed (pain).     [name] albuterol (PROVENTIL HFA;VENTOLIN HFA) 108 (90 BASE) MCG/ACT inhaler Inhale 2 puffs into the lungs every 6 (six) hours as needed for wheezing or shortness of breath.    . allopurinol (ZYLOPRIM) 100 MG tablet Take 1 tablet by mouth 2 (two) times daily.  11  . aspirin 81 MG tablet Take 81 mg by mouth daily.    [name]  bisacodyl (DULCOLAX) 5 MG EC tablet Take 5 mg by mouth daily as needed. constipation    . bisoprolol-hydrochlorothiazide (ZIAC) 5-6.25 MG tablet Take 1 tablet by mouth 2 (two) times daily.    . fish oil-omega-3 fatty acids 1000 MG capsule Take 1 g by mouth 2 (two) times daily.     . fluticasone (FLONASE) 50 MCG/ACT nasal spray Place 2 sprays into the nose daily as needed for allergies.     . furosemide (LASIX) 40 MG tablet Take 20 mg by mouth daily.     . indomethacin (INDOCIN) 25 MG capsule Take 1 capsule by mouth 2 (two) times daily as needed.  0  . LANTUS 100 UNIT/ML injection Inject 110 Units into the skin at bedtime.   5  . magnesium oxide (MAG-OX) 400 MG tablet Take 200 mg by mouth 2 (two)  times daily.     . metFORMIN (GLUCOPHAGE-XR) 500 MG 24 hr tablet Take 2 tablets (1,000 mg total) by mouth 2 (two) times daily.    [name] Neomycin-Bacitracin-Polymyxin (TRIPLE ANTIBIOTIC EX) Apply 1 application topically as needed (for wound).    . nitroGLYCERIN (NITROSTAT) 0.4 MG SL tablet DISSOLVE 1 TABLET UNDER THE TONGUE EVERY 5 MINUTES UP TO 3 DOSES 25 tablet 6  . NOVOLOG 100 UNIT/ML injection Inject 15-20 Units into the skin 3 (three) times daily. INJECT 45 UNITS INTO THE SKIN WITH BREAKFAST, INJECT 20 UNITS INTO THE SKIN AT LUNCH TIME, INJECT 55 UNITS INTO THE SKIN WITH SUPPER. ANY TIME YOUR BG ARE GREATER THAN 150, INJECT INTO THE SKIN AS DIRECTED PER SLIDING SCALE  5  . polyethylene glycol (MIRALAX / GLYCOLAX) packet Take 17 g by mouth daily as needed. For constipation    . potassium chloride SA (K-DUR,KLOR-CON) 20 MEQ tablet Take 20 mEq by mouth daily.    . RABEprazole (ACIPHEX) 20 MG tablet Take 20 mg by mouth daily.    [name] SYNTHROID 150 MCG tablet Take 150 mcg by mouth daily. TAKE 1 TABLET EVERYDAY EXCEPT TAKE 1 AND 1/2 TABLETS ON SUNDAYS.  5  . traMADol (ULTRAM) 50 MG tablet Take 50 mg by mouth every 8 (eight) hours as needed (PAIN).    . Vitamin D, Ergocalciferol, (DRISDOL) 50000 UNITS CAPS Take 50,000 Units by mouth every 7 (seven) days.     No current facility-administered medications on file prior to visit.     Allergies  Allergen Reactions  . Clonidine Derivatives Shortness Of Breath and Swelling  . Lisinopril Shortness Of Breath and Swelling  . Losartan Palpitations  . Penicillins Anaphylaxis  . Plavix [Clopidogrel Bisulfate] Itching  . Quinapril Hcl Shortness Of Breath and Swelling  . Repaglinide Palpitations  . Toprol Xl [Metoprolol Tartrate] Shortness Of Breath and Swelling  . Trovan [Alatrofloxacin] Shortness Of Breath and Swelling  . Atorvastatin Other (See Comments)    myalgias  . Candesartan Cilexetil Other (See Comments)    Reaction unknown  . Clarithromycin Nausea  And Vomiting  . Clopidogrel Bisulfate Other (See Comments)    Reaction unknown  . Ezetimibe Other (See Comments)    Reaction unknown  . Hydromorphone Hcl Nausea And Vomiting  . Metoclopramide Hcl Other (See Comments)    Reaction unknown  . Morphine And Related Nausea And Vomiting  . Oxycodone-Acetaminophen Other (See Comments)    Body feels like bee stings  . Paroxetine Hcl Other (See Comments)    Freezing, burning from inside out.  . Pioglitazone Other (See Comments) and Swelling    Reaction unknown  .  Valsartan Other (See Comments)    Reaction unknown  . Vancomycin Other (See Comments)    Reaction unknown    Mixed hyperlipidemia LDL and Tg remains above goal for secondary prevention. I sent significant amount of time explaining LDL and Tg goal for patient with cardiac history and reasons for those goals. She understands need to control her cholesterol and is willing to resume Omega-3 1gm twice daily, but remains reluctant about Repatha/Praluent.   PCSK9i indication, storage, administration, prior-authorization process, patient assistance program and common side effects discussed during visit.   Patient insistent on trial with rosuvastatin because her mother has similar problems and she is able to tolerate crestor. Patient is also concern about potential ADR of long-acting medication like PCSK9i injections. She is aware of need for close monitoring if rosuvastatin therapy is initiated due to prior myositis with atorvastatin.  Will initiate rosuvastatin 5mg  weekly for 2 weeks, then increase to 5mg  twice weekly if able to tolerate. Plan to repeat lipid panel, CPK and LFTs in 8 weeks (4 weeks after rosuvastatin 5mg  2x/week) to assess response and monitor safety. She is willing to consider Praluent 75mg  every 14 days if unable to tolerate low dose rosuvastatin.    Loreda Silverio Rodriguez-Guzman PharmD, BCPS, CPP [facility] [address] [place]  [id number] 05/09/2018 10:02 AM

## 2018-05-09 ENCOUNTER — Encounter: Payer: Self-pay | Admitting: Pharmacist

## 2018-05-09 NOTE — Assessment & Plan Note (Signed)
LDL and Tg remains above goal for secondary prevention. I sent significant amount of time explaining LDL and Tg goal for patient with cardiac history and reasons for those goals. She understands need to control her cholesterol and is willing to resume Omega-3 1gm twice daily, but remains reluctant about Repatha/Praluent.   PCSK9i indication, storage, administration, prior-authorization process, patient assistance program and common side effects discussed during visit.   Patient insistent on trial with rosuvastatin because her mother has similar problems and she is able to tolerate crestor. Patient is also concern about potential ADR of long-acting medication like PCSK9i injections. She is aware of need for close monitoring if rosuvastatin therapy is initiated due to prior myositis with atorvastatin.  Will initiate rosuvastatin 5mg  weekly for 2 weeks, then increase to 5mg  twice weekly if able to tolerate. Plan to repeat lipid panel, CPK and LFTs in 8 weeks (4 weeks after rosuvastatin 5mg  2x/week) to assess response and monitor safety. She is willing to consider Praluent 75mg  every 14 days if unable to tolerate low dose rosuvastatin.

## 2018-05-27 ENCOUNTER — Encounter: Payer: Self-pay | Admitting: Cardiovascular Disease

## 2018-05-27 ENCOUNTER — Ambulatory Visit: Payer: BLUE CROSS/BLUE SHIELD | Admitting: Cardiovascular Disease

## 2018-05-27 DIAGNOSIS — I739 Peripheral vascular disease, unspecified: Secondary | ICD-10-CM | POA: Diagnosis not present

## 2018-05-27 NOTE — Patient Instructions (Addendum)
Medication Instructions:  Your physician recommends that you continue on your current medications as directed. Please refer to the Current Medication list given to you today.  If you need a refill on your cardiac medications before your next appointment, please call your pharmacy.   Lab work: NONE If you have labs (blood work) drawn today and your tests are completely normal, you will receive your results only by: [name] MyChart Message (if you have MyChart) OR . A paper copy in the mail If you have any lab test that is abnormal or we need to change your treatment, we will call you to review the results.  Testing/Procedures: Your physician has requested that you have a lower extremity arterial duplex. This test is an ultrasound of the arteries in the legs. It looks at arterial blood flow in the legs. Allow one hour for Lower Arterial scans. There are no restrictions or special instructions.  SCHEDULE November/December 2020   Follow-Up: At [facility], you and your health needs are our priority.  As part of our continuing mission to provide you with exceptional heart care, we have created designated Provider Care Teams.  These Care Teams include your primary Cardiologist (physician) and Advanced Practice Providers (APPs -  Physician Assistants and Nurse Practitioners) who all work together to provide you with the care you need, when you need it. You will need a follow up appointment in 12 months.  Please call our office 2 months in advance to schedule this appointment.  You may see Nanetta Batty, MD or one of the following Advanced Practice Providers on your designated Care Team:   Corine Shelter, PA-C Judy Pimple, [place] . Marjie Skiff, PA-C

## 2018-05-27 NOTE — Progress Notes (Signed)
05/27/2018 Courtney Barker   [birth year]  [id number]  Primary Physician Martyn Malay, MD Primary Cardiologist: Runell Gess MD Nicholes Calamity, [place]  HPI:  Courtney Barker is a 62 y.o. moderately overweight married Caucasian female mother of 3, grandmother of 12 grandchildren who was referred to me by Dr. Peter Swaziland for peripheral vascular evaluation.  Her cardiovascular risk factors are notable for 45 pack years of tobacco abuse currently smoking 1 pack/day, treated hypertension, diabetes and hyperlipidemia as well as family history.  She is had multiple stents in the past dating back to 2002.  She is complaining of back pain as well as bilateral calf pain right greater than left with recent Doppler studies performed 05/12/2018 revealing a right ABI 0.7 and a left ABI 0.78 did have a high-frequency signal in her still right SFA with tibial vessel disease with him moderately elevated velocity in her proximal left SFA.   Current Meds  Medication Sig  . acetaminophen (TYLENOL) 500 MG tablet Take 1,000 mg by mouth every 6 (six) hours as needed (pain).   [name] albuterol (PROVENTIL HFA;VENTOLIN HFA) 108 (90 BASE) MCG/ACT inhaler Inhale 2 puffs into the lungs every 6 (six) hours as needed for wheezing or shortness of breath.  . allopurinol (ZYLOPRIM) 100 MG tablet Take 1 tablet by mouth 2 (two) times daily.  [name] aspirin 81 MG tablet Take 81 mg by mouth daily.  . bisacodyl (DULCOLAX) 5 MG EC tablet Take 5 mg by mouth daily as needed. constipation  . bisoprolol-hydrochlorothiazide (ZIAC) 5-6.25 MG tablet Take 1 tablet by mouth 2 (two) times daily.  . fish oil-omega-3 fatty acids 1000 MG capsule Take 1 g by mouth 2 (two) times daily.   . fluticasone (FLONASE) 50 MCG/ACT nasal spray Place 2 sprays into the nose daily as needed for allergies.   . furosemide (LASIX) 40 MG tablet Take 20 mg by mouth daily.   . indomethacin (INDOCIN) 25 MG capsule Take 1 capsule by mouth 2 (two) times daily as needed.    [name] LANTUS 100 UNIT/ML injection Inject 110 Units into the skin at bedtime.   . magnesium oxide (MAG-OX) 400 MG tablet Take 200 mg by mouth 2 (two) times daily.   . metFORMIN (GLUCOPHAGE-XR) 500 MG 24 hr tablet Take 2 tablets (1,000 mg total) by mouth 2 (two) times daily.  [name] Neomycin-Bacitracin-Polymyxin (TRIPLE ANTIBIOTIC EX) Apply 1 application topically as needed (for wound).  . nitroGLYCERIN (NITROSTAT) 0.4 MG SL tablet DISSOLVE 1 TABLET UNDER THE TONGUE EVERY 5 MINUTES UP TO 3 DOSES  . NOVOLOG 100 UNIT/ML injection Inject 15-20 Units into the skin 3 (three) times daily. INJECT 45 UNITS INTO THE SKIN WITH BREAKFAST, INJECT 20 UNITS INTO THE SKIN AT LUNCH TIME, INJECT 55 UNITS INTO THE SKIN WITH SUPPER. ANY TIME YOUR BG ARE GREATER THAN 150, INJECT INTO THE SKIN AS DIRECTED PER SLIDING SCALE  . polyethylene glycol (MIRALAX / GLYCOLAX) packet Take 17 g by mouth daily as needed. For constipation  . potassium chloride SA (K-DUR,KLOR-CON) 20 MEQ tablet Take 20 mEq by mouth daily.  . RABEprazole (ACIPHEX) 20 MG tablet Take 20 mg by mouth daily.  . rosuvastatin (CRESTOR) 5 MG tablet Take 1 tablet (5 mg total) by mouth once a week for 14 days, THEN 1 tablet (5 mg total) 2 (two) times a week.  [name] SYNTHROID 150 MCG tablet Take 150 mcg by mouth daily. TAKE 1 TABLET EVERYDAY EXCEPT TAKE 1 AND 1/2 TABLETS ON SUNDAYS.  [name]  traMADol (ULTRAM) 50 MG tablet Take 50 mg by mouth every 8 (eight) hours as needed (PAIN).  . Vitamin D, Ergocalciferol, (DRISDOL) 50000 UNITS CAPS Take 50,000 Units by mouth every 7 (seven) days.     Allergies  Allergen Reactions  . Clonidine Derivatives Shortness Of Breath and Swelling  . Lisinopril Shortness Of Breath and Swelling  . Losartan Palpitations  . Penicillins Anaphylaxis  . Plavix [Clopidogrel Bisulfate] Itching  . Quinapril Hcl Shortness Of Breath and Swelling  . Repaglinide Palpitations  . Toprol Xl [Metoprolol Tartrate] Shortness Of Breath and Swelling  . Trovan  [Alatrofloxacin] Shortness Of Breath and Swelling  . Atorvastatin Other (See Comments)    myalgias  . Candesartan Cilexetil Other (See Comments)    Reaction unknown  . Clarithromycin Nausea And Vomiting  . Clopidogrel Bisulfate Other (See Comments)    Reaction unknown  . Ezetimibe Other (See Comments)    Reaction unknown  . Hydromorphone Hcl Nausea And Vomiting  . Metoclopramide Hcl Other (See Comments)    Reaction unknown  . Morphine And Related Nausea And Vomiting  . Oxycodone-Acetaminophen Other (See Comments)    Body feels like bee stings  . Paroxetine Hcl Other (See Comments)    Freezing, burning from inside out.  . Pioglitazone Other (See Comments) and Swelling    Reaction unknown  . Valsartan Other (See Comments)    Reaction unknown  . Vancomycin Other (See Comments)    Reaction unknown    Social History   Socioeconomic History  . Marital status: Married    Spouse name: Not on file  . Number of children: Not on file  . Years of education: Not on file  . Highest education level: Not on file  Occupational History  . Not on file  Social Needs  . Financial resource strain: Not on file  . Food insecurity:    Worry: Not on file    Inability: Not on file  . Transportation needs:    Medical: Not on file    Non-medical: Not on file  Tobacco Use  . Smoking status: Current Every Day Smoker    Packs/day: 1.00    Years: 43.00    Pack years: 43.00    Types: Cigarettes  . Smokeless tobacco: Never Used  Substance and Sexual Activity  . Alcohol use: No    Alcohol/week: 0.0 standard drinks  . Drug use: No  . Sexual activity: Not Currently  Lifestyle  . Physical activity:    Days per week: Not on file    Minutes per session: Not on file  . Stress: Not on file  Relationships  . Social connections:    Talks on phone: Not on file    Gets together: Not on file    Attends religious service: Not on file    Active member of club or organization: Not on file    Attends  meetings of clubs or organizations: Not on file    Relationship status: Not on file  . Intimate partner violence:    Fear of current or ex partner: Not on file    Emotionally abused: Not on file    Physically abused: Not on file    Forced sexual activity: Not on file  Other Topics Concern  . Not on file  Social History Narrative  . Not on file     Review of Systems: General: negative for chills, fever, night sweats or weight changes.  Cardiovascular: negative for chest pain, dyspnea on exertion, edema, orthopnea,  palpitations, paroxysmal nocturnal dyspnea or shortness of breath Dermatological: negative for rash Respiratory: negative for cough or wheezing Urologic: negative for hematuria Abdominal: negative for nausea, vomiting, diarrhea, bright red blood per rectum, melena, or hematemesis Neurologic: negative for visual changes, syncope, or dizziness All other systems reviewed and are otherwise negative except as noted above.    Blood pressure 122/76, pulse (!) 52, height 5' (1.524 m), weight 198 lb 12.8 oz (90.2 kg).  General appearance: alert and no distress Neck: no adenopathy, no carotid bruit, no JVD, supple, symmetrical, trachea midline and thyroid not enlarged, symmetric, no tenderness/mass/nodules Lungs: clear to auscultation bilaterally Heart: regular rate and rhythm, S1, S2 normal, no murmur, click, rub or gallop Extremities: extremities normal, atraumatic, no cyanosis or edema Pulses: 2+ and symmetric Skin: Skin color, texture, turgor normal. No rashes or lesions Neurologic: Alert and oriented X 3, normal strength and tone. Normal symmetric reflexes. Normal coordination and gait  EKG not performed today  ASSESSMENT AND PLAN:   Peripheral arterial disease (HCC) Ms. Amada Jupiter was referred to me by Dr. Swaziland for evaluation of peripheral arterial disease.  She does have known CAD as well as multiple vascular risk factors.  She does complain of back pain as well as some  intermittent right greater than left calf pain with ambulation.  She had recent Doppler studies performed in our office revealing a right ABI of 0.7 and a left of .78.  She had a high-frequency signal in her distal right SFA with tibial vessel disease on the right and moderate proximal left SFA stenosis.  At this point, she wishes to proceed evaluation of the back prior to any vascular invasive procedures which I think is reasonable.  We will get lower extremity arterial Doppler studies in the ER and I will see her back after that for follow-up      Runell Gess MD [facility], [facility] 05/27/2018 3:45 PM

## 2018-05-27 NOTE — Assessment & Plan Note (Signed)
Ms. Courtney Barker was referred to me by Dr. Swaziland for evaluation of peripheral arterial disease.  She does have known CAD as well as multiple vascular risk factors.  She does complain of back pain as well as some intermittent right greater than left calf pain with ambulation.  She had recent Doppler studies performed in our office revealing a right ABI of 0.7 and a left of .78.  She had a high-frequency signal in her distal right SFA with tibial vessel disease on the right and moderate proximal left SFA stenosis.  At this point, she wishes to proceed evaluation of the back prior to any vascular invasive procedures which I think is reasonable.  We will get lower extremity arterial Doppler studies in the ER and I will see her back after that for follow-up

## 2018-05-27 NOTE — Addendum Note (Signed)
Addended by: Harlow Asa on: 05/27/2018 03:52 PM   Modules accepted: Orders

## 2018-10-02 ENCOUNTER — Telehealth: Payer: Self-pay | Admitting: Cardiology

## 2018-10-02 NOTE — Telephone Encounter (Signed)
LVM to schedule. 10-02-18 ST

## 2018-10-22 ENCOUNTER — Telehealth: Payer: Self-pay | Admitting: Cardiology

## 2018-10-22 NOTE — Telephone Encounter (Signed)
My Chart message sent

## 2019-02-22 ENCOUNTER — Other Ambulatory Visit: Payer: Self-pay | Admitting: Internal Medicine

## 2019-04-06 NOTE — Progress Notes (Signed)
Cardiology Office Note   Date:  04/08/2019   ID:  Courtney Barker, DOB 10/07/1955, MRN [id number]  PCP:  Tempie Hoist, MD  Cardiologist: Dr. Martinique  CC: Follow Up    History of Present Illness: Courtney Barker is a 63 y.o. female who presents for ongoing assessment and management of coronary artery disease, history of bare-metal stenting of the right coronary artery in 2002, repeat cardiac catheterization in July 2015 at [facility] showed complete total occlusion of the RCA and was recommended to be treated medically.  There was moderate disease noted in the third OM and mild disease in LAD.  Other history includes hypertension, hyperlipidemia, type 2 diabetes mellitus, and anxiety with panic attacks, and  ongoing tobacco abuse.  A repeat cardiac catheterization was completed in March 2017 due to recurrent chest pain and abnormal Myoview stress test which showed a partially reversible defect in apex and inferior lateral wall.  This revealed again a CTO of the RCA at prior stent site with a large OM 3 with 99% stenosis that was successfully stented with drug-eluting stent x2.  It was noted that she was not a candidate for statin therapy if she was intolerant.  She was referred to lipid clinic for PCK S9 inhibition therapy.  She was started on low-dose rosuvastatin 5 mg twice a week with plans to repeat lipid panel.  If there was no improvement in her labs she would consider Praluent therapy.  She was also seen by Dr. Gwenlyn Found on 05/27/2018 due to intermittent claudication symptoms, with back pain.  ABIs revealed that the right indices of 0.7 and left of 0.78.  She had a high-frequency signal in her distal right FSA with tibial vessel disease on the right and moderate proximal left FSA stenosis.  She refused any intervention at that time, and wished to follow-up with orthopedics concerning her chronic back pain first.She was last seen by Dr. Martinique on 03/31/2018 with multiple complaints.  Mrs. Still  comes today with continued complaints of lower extremity pain.  She also stopped taking the rosuvastatin as it was causing muscle aches and pains.  She is interested in following up more concerning evaluation of her intermittent claudication symptoms with abnormal ABIs.  She would, however, like a second opinion concerning this as she did not feel she communicated well with Dr.Berry.  She also complains of medication intolerances causing her to have "panic attacks", and is uncertain about whether or not it would be feasible to be seen in the lipid clinic for [place] S9 inhibition therapy.  She also would like to quit smoking but she states that her nerves are so bad at times that she is difficult for her to stop.  She cannot take Chantix, or Wellbutrin. Past Medical History:  Diagnosis Date  . Anxiety   . Arthritis    "head to toe; neck, hands, arms, knees, feet, ankles" (08/26/2015)  . Asthma   . Cervical spondylosis with myelopathy    s/p C5-6 and C6-7 Anterior cervical discectomy/decompression; C5-6 and C6-7 interbody arthrodesis with local morcellized autograft bone and Actifuse bone graft extender; insertion of interbody prosthesis at C5-6 and C6-7 (Zimmer peek interbody prosthesis); anterior cervical plating from C5-6 and C6-7 with globus titanium plate 08/23/11  . Chronic lower back pain   . COPD (chronic obstructive pulmonary disease) ([place])   . Coronary artery disease    s/p BMS x3 RCA '02; repeat cath in '02 and '05 were without significant ISR and normal LV function;  pt reported unremarkable nuclear myoview 2011/12  . Depression   . Fibromyalgia   . GERD (gastroesophageal reflux disease)   . Headache(784.0)    "2-3 times/week" (08/26/2015)  . Heart murmur   . Hyperlipidemia    statin intolerant, to be followed by lipid clinic  . Hypertension   . Hypothyroidism   . Increased urinary protein excretion   . Myocardial infarction [facility](HCC) 12/2013   "[place]"  . Neuromuscular disorder (HCC)    . PONV (postoperative nausea and vomiting)   . Sleep apnea    NO STUDY DONE .[name]..   . Thyroid nodule   . Tobacco abuse   . Type II diabetes mellitus (HCC)     Past Surgical History:  Procedure Laterality Date  . ABDOMINAL HYSTERECTOMY  2000   "fibroids; took 1 ovary"  . ANTERIOR CERVICAL DECOMP/DISCECTOMY FUSION  08/23/2011   Procedure: ANTERIOR CERVICAL DECOMPRESSION/DISCECTOMY FUSION 2 LEVELS;  Surgeon: Cristi LoronJeffrey D Jenkins, MD;  Location: [facility]UR[facility];  Service: Neurosurgery;  Laterality: N/A;  Anterior Cervical Five-Six/Six-Seven Decompression with Fusion with Interbody Prothesis, Plating, and Bonegraft  . BACK SURGERY    . BOWEL RESECTION  2007   . CARDIAC CATHETERIZATION N/A 08/26/2015   Procedure: Coronary Stent Intervention;  Surgeon: Peter M SwazilandJordan, MD;  [facility];  Service: Cardiovascular;  Laterality: N/A;  OM3 and circ  . CARDIAC CATHETERIZATION  2002; 2005   patent stents  . CARDIAC CATHETERIZATION  12/2013   CTO of the RCA ; moderate disease noted in the third OM and mild disease in the LAD; treated medically  . COLONOSCOPY W/ POLYPECTOMY    . CORONARY ANGIOPLASTY WITH STENT PLACEMENT  2002   s/p BMS x3 RCA   . MULTIPLE TOOTH EXTRACTIONS  2000  . TONSILLECTOMY    . TUBAL LIGATION  1981     Current Outpatient Medications  Medication Sig Dispense Refill  . acetaminophen (TYLENOL) 500 MG tablet Take 1,000 mg by mouth every 6 (six) hours as needed (pain).     [name]. albuterol (PROVENTIL HFA;VENTOLIN HFA) 108 (90 BASE) MCG/ACT inhaler Inhale 2 puffs into the lungs every 6 (six) hours as needed for wheezing or shortness of breath.    . allopurinol (ZYLOPRIM) 100 MG tablet Take 1 tablet by mouth 2 (two) times daily.  11  . aspirin 81 MG tablet Take 81 mg by mouth daily.    . bisacodyl (DULCOLAX) 5 MG EC tablet Take 5 mg by mouth daily as needed. constipation    . bisoprolol-hydrochlorothiazide (ZIAC) 5-6.25 MG tablet Take 1 tablet by mouth 2 (two) times daily.    .  fish oil-omega-3 fatty acids 1000 MG capsule Take 1 g by mouth 2 (two) times daily.     . fluticasone (FLONASE) 50 MCG/ACT nasal spray Place 2 sprays into the nose daily as needed for allergies.     . furosemide (LASIX) 40 MG tablet Take 20 mg by mouth daily.     . indomethacin (INDOCIN) 25 MG capsule Take 1 capsule by mouth 2 (two) times daily as needed.  0  . LANTUS 100 UNIT/ML injection Inject 110 Units into the skin at bedtime.   5  . magnesium oxide (MAG-OX) 400 MG tablet Take 200 mg by mouth 2 (two) times daily.     . metFORMIN (GLUCOPHAGE-XR) 500 MG 24 hr tablet Take 2 tablets (1,000 mg total) by mouth 2 (two) times daily.    [name]. Neomycin-Bacitracin-Polymyxin (TRIPLE ANTIBIOTIC EX) Apply 1 application topically as needed (  for wound).    . nitroGLYCERIN (NITROSTAT) 0.4 MG SL tablet DISSOLVE 1 TABLET UNDER THE TONGUE EVERY 5 MINUTES UP TO 3 DOSES 25 tablet 6  . NOVOLOG 100 UNIT/ML injection Inject 15-20 Units into the skin 3 (three) times daily. INJECT 45 UNITS INTO THE SKIN WITH BREAKFAST, INJECT 20 UNITS INTO THE SKIN AT LUNCH TIME, INJECT 55 UNITS INTO THE SKIN WITH SUPPER. ANY TIME YOUR BG ARE GREATER THAN 150, INJECT INTO THE SKIN AS DIRECTED PER SLIDING SCALE  5  . polyethylene glycol (MIRALAX / GLYCOLAX) packet Take 17 g by mouth daily as needed. For constipation    . potassium chloride SA (K-DUR,KLOR-CON) 20 MEQ tablet Take 20 mEq by mouth daily.    [name] SYNTHROID 150 MCG tablet Take 150 mcg by mouth daily. TAKE 1 TABLET EVERYDAY EXCEPT TAKE 1 AND 1/2 TABLETS ON SUNDAYS.  5  . traMADol (ULTRAM) 50 MG tablet Take 50 mg by mouth every 8 (eight) hours as needed (PAIN).    . Vitamin D, Ergocalciferol, (DRISDOL) 50000 UNITS CAPS Take 50,000 Units by mouth every 7 (seven) days.    . cilostazol (PLETAL) 50 MG tablet Take 1 tablet (50 mg total) by mouth 2 (two) times daily. 30 tablet 6  . RABEprazole (ACIPHEX) 20 MG tablet Take 20 mg by mouth daily.    . rosuvastatin (CRESTOR) 5 MG tablet Take 1  tablet (5 mg total) by mouth once a week for 14 days, THEN 1 tablet (5 mg total) 2 (two) times a week. 15 tablet 0   No current facility-administered medications for this visit.     Allergies:   Clonidine derivatives, Lisinopril, Losartan, Penicillins, Plavix [clopidogrel bisulfate], Quinapril hcl, Repaglinide, Toprol xl [metoprolol tartrate], Trovan [alatrofloxacin], Atorvastatin, Candesartan cilexetil, Clarithromycin, Clopidogrel bisulfate, Ezetimibe, Hydromorphone hcl, Metoclopramide hcl, Morphine and related, Oxycodone-acetaminophen, Paroxetine hcl, Pioglitazone, Valsartan, and Vancomycin    Social History:  The patient  reports that she has been smoking cigarettes. She has a 43.00 pack-year smoking history. She has never used smokeless tobacco. She reports that she does not drink alcohol or use drugs.   Family History:  The patient's family history includes Anesthesia problems in her mother and sister; Diabetes in her maternal grandfather and sister; Heart Problems in her sister; Heart attack in her father; Lupus in her sister; Neuropathy in her father and sister.    ROS: All other systems are reviewed and negative. Unless otherwise mentioned in H&P    PHYSICAL EXAM: VS:  BP (!) 182/90   Pulse (!) 58   Ht 5' (1.524 m)   Wt 200 lb 6.4 oz (90.9 kg)   BMI 39.14 kg/m  , BMI Body mass index is 39.14 kg/m. GEN: Well nourished, well developed, in no acute distress HEENT: normal Neck: no JVD, carotid bruits, or masses Cardiac: RRR; 1/6 systolic murmurs, rubs, or gallops,no edema  Respiratory:  Clear to auscultation bilaterally, normal work of breathing GI: soft, nontender, nondistended, + BS MS: no deformity or atrophy.  Diminished pulses dorsalis pedis posterior tibial Skin: warm and dry, no rash Neuro:  Strength and sensation are intact Psych: euthymic mood, full affect   EKG: Sinus bradycardia, heart rate of 58 bpm, left anterior fascicular block, with LVH.    Recent Labs: No  results found for requested labs within last 8760 hours.    Lipid Panel    Component Value Date/Time   CHOL 240 (H) 03/31/2018 1004   TRIG 195 (H) 03/31/2018 1004   TRIG 242 (HH) 04/08/2006  1520   HDL 38 (L) 03/31/2018 1004   CHOLHDL 7.4 CALC 09/27/2006 1500   VLDL 47 (H) 09/27/2006 1500   LDLCALC 163 (H) 03/31/2018 1004   LDLDIRECT 221.2 09/27/2006 1500      Wt Readings from Last 3 Encounters:  04/08/19 200 lb 6.4 oz (90.9 kg)  05/27/18 198 lb 12.8 oz (90.2 kg)  03/31/18 197 lb 9.6 oz (89.6 kg)      Other studies Reviewed: Holter monitor 03-24-15: Study Highlights   1. NSR 2. Frequent PVC's with couplets 3. Rare NS atrial tachycardia 4. No sustained brady or tachyarrhythmias     Myoview 07/01/15: Study Highlights    The left ventricular ejection fraction is normal (55-65%).  There was no ST segment deviation noted during stress.  No T wave inversion was noted during stress.  Defect 1: There is a large defect of moderate severity.  Findings consistent with prior myocardial infarction with peri-infarct ischemia.  This is an intermediate risk study.  Large size, moderate intensity, partially reversible (SDS 6) apical and inferolateral wall perfusion defect with normal wall motion. LVEF 56%. This is an intermediate risk study concerning for ischemia in the LCX territory. Clinical correlation is advised.    Cardiac cath/ PCI 08/26/15: Conclusion    Ost RCA to Mid RCA lesion, 100% stenosed. The lesion was previously treated with a bare metal stent greater than two years ago.  1st Diag lesion, 40% stenosed.  Ost LAD to Prox LAD lesion, 20% stenosed.  Prox Cx lesion, 30% stenosed.  3rd Mrg lesion, 99% stenosed. Post intervention, there is a 0% residual stenosis.  1. Severe 2 vessel obstructive CAD - CTO of the RCA at prior stented segment with left to right collaterals. - New severe lesion in a large OM3 2. Successful stenting of OM3 with DES  x 2.   Plan: Continue DAPT for one year with ASA 81 mg daily and Effient. Risk factor modification. Would continue medical therapy for CTO of RCA. Could consider for CTO PCI but vessel was small in 2002 so long term patency would not be favorable. Anticipate DC in am.      ASSESSMENT AND PLAN:  1.  Bilateral leg pain with intermittent claudication symptoms: Abnormal ABIs, with follow-up with Dr.Berry.  She stated that she was unable to communicate well with him and would like to be seen by another vascular physician within our practice to discuss options.  She is going to be referred to Dr. Kirke Corin for ongoing recommendations for further testing.  She does have an arteriogram scheduled in December 2020 however this can be rescheduled if he is not inclined to proceed at that time.  In the interim I will start her on Pletal 50 mg twice daily.  She has no history of CHF.  2.  Coronary artery disease: History of bare-metal stent to the right coronary artery in 2002, most recent cardiac catheterization March 2017 revealing CTO of the RCA at prior stent site, large OM 3 with 99% stenosis, that was successfully stented with DES x2.  She will continue on current medication therapy.  3.  Hyperlipidemia: Intolerant to multiple statins.  I will refer her to the lipid clinic to discuss PCSK9 inhibition therapy.  4.  Ongoing tobacco abuse: She would like to quit smoking but has significant amount of anxiety and depression.  Smoking does help relieve her symptoms.  She is intolerant of Chantix and Wellbutrin.  She will need to follow-up with PCP for further recommendations.  5. Flu Prevention: She is given a flu shot today in the office.   Current medicines are reviewed at length with the patient today.    Labs/ tests ordered today include: CBC, BMET, hemoglobin A1c, uric acid.  Bettey MareKathryn M. Liborio NixonLawrence DNP, ANP, AACC   04/08/2019 11:20 AM    [facility] [address]  Office [phone] Fax [phone]  Notice: This dictation was prepared with Dragon dictation along with smaller phrase technology. Any transcriptional errors that result from this process are unintentional and may not be corrected upon review.

## 2019-04-08 ENCOUNTER — Encounter: Payer: Self-pay | Admitting: Adult Health

## 2019-04-08 ENCOUNTER — Ambulatory Visit (INDEPENDENT_AMBULATORY_CARE_PROVIDER_SITE_OTHER): Payer: BLUE CROSS/BLUE SHIELD | Admitting: Adult Health

## 2019-04-08 ENCOUNTER — Other Ambulatory Visit: Payer: Self-pay

## 2019-04-08 VITALS — BP 182/90 | HR 58 | Ht 60.0 in | Wt 200.4 lb

## 2019-04-08 DIAGNOSIS — E1159 Type 2 diabetes mellitus with other circulatory complications: Secondary | ICD-10-CM | POA: Diagnosis not present

## 2019-04-08 DIAGNOSIS — I10 Essential (primary) hypertension: Secondary | ICD-10-CM

## 2019-04-08 DIAGNOSIS — I209 Angina pectoris, unspecified: Secondary | ICD-10-CM

## 2019-04-08 DIAGNOSIS — Z794 Long term (current) use of insulin: Secondary | ICD-10-CM

## 2019-04-08 DIAGNOSIS — Z79899 Other long term (current) drug therapy: Secondary | ICD-10-CM

## 2019-04-08 DIAGNOSIS — N182 Chronic kidney disease, stage 2 (mild): Secondary | ICD-10-CM

## 2019-04-08 DIAGNOSIS — E0822 Diabetes mellitus due to underlying condition with diabetic chronic kidney disease: Secondary | ICD-10-CM

## 2019-04-08 DIAGNOSIS — E782 Mixed hyperlipidemia: Secondary | ICD-10-CM

## 2019-04-08 MED ORDER — CILOSTAZOL 50 MG PO TABS: 50.0000 mg | ORAL_TABLET | Freq: Two times a day (BID) | ORAL | 6 refills | Status: DC

## 2019-04-08 NOTE — Patient Instructions (Addendum)
Medication Instructions:  START- Pletal 50 mg by mouth daily  If you need a refill on your cardiac medications before your next appointment, please call your pharmacy.  Labwork: CMP, CBC, HgB A1C, Uric Acid, Fasting Lipid and Liver HERE IN OUR OFFICE AT [facility]  You will need to fast. DO NOT EAT OR DRINK PAST MIDNIGHT.      If you have labs (blood work) drawn today and your tests are completely normal, you will receive your results only by: [name] MyChart Message (if you have MyChart) OR . A paper copy in the mail If you have any lab test that is abnormal or we need to change your treatment, we will call you to review the results.  Testing/Procedures: None Ordered   Follow-Up: Your physician recommends that you schedule a follow-up appointment in: Next available with Dr Fletcher Anon  Your physician recommends that you schedule a follow-up appointment in: [facility] for Courtney Barker, you and your health needs are our priority.  As part of our continuing mission to provide you with exceptional heart care, we have created designated Provider Care Teams.  These Care Teams include your primary Cardiologist (physician) and Advanced Practice Providers (APPs -  Physician Assistants and Nurse Practitioners) who all work together to provide you with the care you need, when you need it.  Thank you for choosing [facility] at [facility]!!

## 2019-04-09 LAB — COMPREHENSIVE METABOLIC PANEL
ALT: 42 IU/L — ABNORMAL HIGH (ref 0–32)
AST: 34 IU/L (ref 0–40)
Albumin/Globulin Ratio: 1.7 (ref 1.2–2.2)
Albumin: 4.5 g/dL (ref 3.8–4.8)
Alkaline Phosphatase: 72 IU/L (ref 39–117)
BUN/Creatinine Ratio: 12 (ref 12–28)
BUN: 10 mg/dL (ref 8–27)
Bilirubin Total: 0.4 mg/dL (ref 0.0–1.2)
CO2: 24 mmol/L (ref 20–29)
Calcium: 9.4 mg/dL (ref 8.7–10.3)
Chloride: 95 mmol/L — ABNORMAL LOW (ref 96–106)
Creatinine, Ser: 0.86 mg/dL (ref 0.57–1.00)
GFR calc Af Amer: 84 mL/min/{1.73_m2} (ref >59)
GFR calc non Af Amer: 73 mL/min/{1.73_m2} (ref >59)
Globulin, Total: 2.7 g/dL (ref 1.5–4.5)
Glucose: 170 mg/dL — ABNORMAL HIGH (ref 65–99)
Potassium: 4.3 mmol/L (ref 3.5–5.2)
Sodium: 135 mmol/L (ref 134–144)
Total Protein: 7.2 g/dL (ref 6.0–8.5)

## 2019-04-09 LAB — HEPATIC FUNCTION PANEL: Bilirubin, Direct: 0.14 mg/dL (ref 0.00–0.40)

## 2019-04-09 LAB — HEMOGLOBIN A1C
Est. average glucose Bld gHb Est-mCnc: 157 mg/dL
Hgb A1c MFr Bld: 7.1 % — ABNORMAL HIGH (ref 4.8–5.6)

## 2019-04-09 LAB — CBC
Hematocrit: 42.8 % (ref 34.0–46.6)
Hemoglobin: 15.2 g/dL (ref 11.1–15.9)
MCH: 34.1 pg — ABNORMAL HIGH (ref 26.6–33.0)
MCHC: 35.5 g/dL (ref 31.5–35.7)
MCV: 96 fL (ref 79–97)
Platelets: 202 10*3/uL (ref 150–450)
RBC: 4.46 x10E6/uL (ref 3.77–5.28)
RDW: 12.7 % (ref 11.7–15.4)
WBC: 9 10*3/uL (ref 3.4–10.8)

## 2019-04-09 LAB — LIPID PANEL
Chol/HDL Ratio: 6.8 ratio — ABNORMAL HIGH (ref 0.0–4.4)
Cholesterol, Total: 277 mg/dL — ABNORMAL HIGH (ref 100–199)
HDL: 41 mg/dL (ref >39)
LDL Chol Calc (NIH): 174 mg/dL — ABNORMAL HIGH (ref 0–99)
Triglycerides: 321 mg/dL — ABNORMAL HIGH (ref 0–149)
VLDL Cholesterol Cal: 62 mg/dL — ABNORMAL HIGH (ref 5–40)

## 2019-04-09 LAB — URIC ACID: Uric Acid: 9.4 mg/dL — ABNORMAL HIGH (ref 2.5–7.1)

## 2019-05-08 ENCOUNTER — Other Ambulatory Visit: Payer: Self-pay | Admitting: Adult Health

## 2019-05-08 MED ORDER — CILOSTAZOL 50 MG PO TABS: 50.0000 mg | ORAL_TABLET | Freq: Two times a day (BID) | ORAL | 3 refills | Status: DC

## 2019-05-08 NOTE — Telephone Encounter (Signed)
Rx(s) sent to pharmacy electronically.  

## 2019-05-08 NOTE — Telephone Encounter (Signed)
°*  STAT* If patient is at the pharmacy, call can be transferred to refill team.   1. Which medications need to be refilled? (please list name of each medication and dose if known) CILOSTAZOL 50 mg two twice a day (4 for the day)  2. Which pharmacy/location (including street and city if local pharmacy) is medication to be sent to?  [id number] - [place], [place] - [address] AT [address] [phone] (Phone) [phone] (Fax)    3. Do they need a 30 day or 90 day supply? [place]

## 2019-05-12 ENCOUNTER — Ambulatory Visit: Payer: BLUE CROSS/BLUE SHIELD

## 2019-05-12 ENCOUNTER — Ambulatory Visit: Payer: BLUE CROSS/BLUE SHIELD | Admitting: Cardiovascular Disease

## 2019-05-22 ENCOUNTER — Encounter: Payer: Self-pay | Admitting: Cardiology

## 2019-05-22 ENCOUNTER — Telehealth: Payer: Self-pay | Admitting: Adult Health

## 2019-05-22 NOTE — Telephone Encounter (Signed)
Spoke to patient. She states she was she was informed that she is was suppose to be taking  100 mg   Twice a day . ( but patient wanted to  Take (2) 50 mg tablets  Twice a day to equal 100 mg. Per note, instructions are 50 mg twice a day.  patient states she is willing to the higher amount so she would a new prescription before seeing Dr Fletcher Anon.  patient aware will defer to K. Lawrence and contact patient back

## 2019-05-22 NOTE — Telephone Encounter (Signed)
Pt c/o medication issue:  1. Name of Medication: cilostazol (PLETAL) 50 MG tablet  2. How are you currently taking this medication (dosage and times per day)? 1 tablet twice daily  3. Are you having a reaction (difficulty breathing--STAT)? No  4. What is your medication issue? Patient is stating she was advised to start taking 200 MG per day instead of 100 MG & pharmacy is telling her she is still to take 100 MG a day. Please Advise.

## 2019-05-24 NOTE — Telephone Encounter (Signed)
She was to take 50 mg BID according to my office note.   She should continue at this dose until seen by Dr. Fletcher Anon.

## 2019-05-26 NOTE — Telephone Encounter (Signed)
Per dpr -- Left detail message on [phone]- take 50 mg  cilostazol twice a day. Until patient appointment with Dr Fletcher Anon in Jan 2021. At that time he will make decision.  any question may call back

## 2019-05-27 ENCOUNTER — Other Ambulatory Visit: Payer: Self-pay | Admitting: Adult Health

## 2019-05-27 MED ORDER — CILOSTAZOL 50 MG PO TABS: ORAL_TABLET | ORAL | 3 refills | Status: DC

## 2019-05-27 NOTE — Telephone Encounter (Signed)
Follow up   Patient has additional questions about this medication per the previous message. Please call to discuss.

## 2019-05-27 NOTE — Telephone Encounter (Signed)
I have allowed her to increase the Pletal fo 100 mg BiD. She is to see Dr.Arida in the next few weeks.

## 2019-05-27 NOTE — Telephone Encounter (Signed)
Spoke to patient.  She states that the information about taking 50 mg  Twice a day is not what was told to her at the office visit.  Patient states she has felt some changes and she would like to try to "get have enough in her system to work before she sees Dr Fletcher Anon."  Patient is adamant she would like to to discuss with K.Lawrence   RN  attempted to explain that the does she is taking is the standard dosing. Patient  Becoming frustated and tearing up . She repeats that is not what was said to me. " help dilated my veins".  "Does the doctor every call ? I will just stop taking tj=he medication , I did not think it wiould be this hard to  ncrease medication have got my mind right to take it ,now" RN  informed patient , she can hear her frustation and will defer to K.Lawrence to see if she will be able to discuss matter with patient.

## 2019-05-28 ENCOUNTER — Encounter (HOSPITAL_COMMUNITY): Payer: BLUE CROSS/BLUE SHIELD

## 2019-05-28 ENCOUNTER — Inpatient Hospital Stay (HOSPITAL_COMMUNITY): Admission: RE | Admit: 2019-05-28 | Payer: BLUE CROSS/BLUE SHIELD | Source: Ambulatory Visit

## 2019-07-14 ENCOUNTER — Ambulatory Visit (INDEPENDENT_AMBULATORY_CARE_PROVIDER_SITE_OTHER): Payer: BC Managed Care – PPO | Admitting: Pharmacist Clinician (PhC)/ Clinical Pharmacy Specialist

## 2019-07-14 ENCOUNTER — Encounter: Payer: Self-pay | Admitting: Cardiovascular Disease

## 2019-07-14 ENCOUNTER — Other Ambulatory Visit: Payer: Self-pay

## 2019-07-14 ENCOUNTER — Ambulatory Visit: Payer: BC Managed Care – PPO | Admitting: Cardiovascular Disease

## 2019-07-14 VITALS — BP 140/74 | HR 69 | Ht 60.0 in | Wt 201.0 lb

## 2019-07-14 DIAGNOSIS — I25810 Atherosclerosis of coronary artery bypass graft(s) without angina pectoris: Secondary | ICD-10-CM

## 2019-07-14 DIAGNOSIS — I10 Essential (primary) hypertension: Secondary | ICD-10-CM

## 2019-07-14 DIAGNOSIS — Z720 Tobacco use: Secondary | ICD-10-CM

## 2019-07-14 DIAGNOSIS — E782 Mixed hyperlipidemia: Secondary | ICD-10-CM

## 2019-07-14 DIAGNOSIS — I739 Peripheral vascular disease, unspecified: Secondary | ICD-10-CM | POA: Diagnosis not present

## 2019-07-14 NOTE — Patient Instructions (Signed)
Pt was seen by Dr. Kirke Corin as well, information about cholesterol meds and office phone number given.

## 2019-07-14 NOTE — Progress Notes (Signed)
Cardiology Office Note   Date:  07/14/2019   ID:  Courtney Barker, DOB 08-25-1955, MRN [id number]  PCP:  Martyn Malay, MD  Cardiologist: Dr. Swaziland  No chief complaint on file.     History of Present Illness: Courtney Barker is a 64 y.o. female who presents for evaluation of peripheral arterial disease.  She was seen by [facility] for this but requested a second opinion. She has known history of coronary artery disease with chronically occluded RCA, essential hypertension, hyperlipidemia, type 2 diabetes, COPD, tobacco use and anxiety disorder. She was seen by Dr. Allyson Sabal in December 2019 for management of intermittent claudication and mildly reduced ABI bilaterally.  Duplex showed severe stenosis in the distal right SFA and moderate left SFA disease.   She reports occasional calf discomfort at rest but not necessarily with exertion.  Her biggest issue seems to be lower back pain with walking short distance. She denies chest pain but does complain of exertional dyspnea.  She continues to smoke half a pack per day.   Past Medical History:  Diagnosis Date  . Anxiety   . Arthritis    "head to toe; neck, hands, arms, knees, feet, ankles" (08/26/2015)  . Asthma   . Cervical spondylosis with myelopathy    s/p C5-6 and C6-7 Anterior cervical discectomy/decompression; C5-6 and C6-7 interbody arthrodesis with local morcellized autograft bone and Actifuse bone graft extender; insertion of interbody prosthesis at C5-6 and C6-7 (Zimmer peek interbody prosthesis); anterior cervical plating from C5-6 and C6-7 with globus titanium plate 09/17/26  . Chronic lower back pain   . COPD (chronic obstructive pulmonary disease) (HCC)   . Coronary artery disease    s/p BMS x3 RCA '02; repeat cath in '02 and '05 were without significant ISR and normal LV function; pt reported unremarkable nuclear myoview 2011/12  . Depression   . Fibromyalgia   . GERD (gastroesophageal reflux disease)   . Headache(784.0)     "2-3 times/week" (08/26/2015)  . Heart murmur   . Hyperlipidemia    statin intolerant, to be followed by lipid clinic  . Hypertension   . Hypothyroidism   . Increased urinary protein excretion   . Myocardial infarction [facility]) 12/2013   "[place]"  . Neuromuscular disorder (HCC)   . PONV (postoperative nausea and vomiting)   . Sleep apnea    NO STUDY DONE .[name].   . Thyroid nodule   . Tobacco abuse   . Type II diabetes mellitus (HCC)     Past Surgical History:  Procedure Laterality Date  . ABDOMINAL HYSTERECTOMY  2000   "fibroids; took 1 ovary"  . ANTERIOR CERVICAL DECOMP/DISCECTOMY FUSION  08/23/2011   Procedure: ANTERIOR CERVICAL DECOMPRESSION/DISCECTOMY FUSION 2 LEVELS;  Surgeon: Cristi Loron, MD;  Location: [facility] NEURO ORS;  Service: Neurosurgery;  Laterality: N/A;  Anterior Cervical Five-Six/Six-Seven Decompression with Fusion with Interbody Prothesis, Plating, and Bonegraft  . BACK SURGERY    . BOWEL RESECTION  2007   . CARDIAC CATHETERIZATION N/A 08/26/2015   Procedure: Coronary Stent Intervention;  Surgeon: Peter M Swaziland, MD;  Location: [facility] INVASIVE CV LAB;  Service: Cardiovascular;  Laterality: N/A;  OM3 and circ  . CARDIAC CATHETERIZATION  2002; 2005   patent stents  . CARDIAC CATHETERIZATION  12/2013   CTO of the RCA ; moderate disease noted in the third OM and mild disease in the LAD; treated medically  . COLONOSCOPY W/ POLYPECTOMY    . CORONARY ANGIOPLASTY WITH STENT PLACEMENT  2002  s/p BMS x3 RCA   . MULTIPLE TOOTH EXTRACTIONS  2000  . TONSILLECTOMY    . TUBAL LIGATION  1981     Current Outpatient Medications  Medication Sig Dispense Refill  . acetaminophen (TYLENOL) 500 MG tablet Take 1,000 mg by mouth every 6 (six) hours as needed (pain).     [name] albuterol (PROVENTIL HFA;VENTOLIN HFA) 108 (90 BASE) MCG/ACT inhaler Inhale 2 puffs into the lungs every 6 (six) hours as needed for wheezing or shortness of breath.    . allopurinol (ZYLOPRIM) 100 MG tablet Take 1 tablet  by mouth 2 (two) times daily.  11  . aspirin 81 MG tablet Take 81 mg by mouth daily.    . bisacodyl (DULCOLAX) 5 MG EC tablet Take 5 mg by mouth daily as needed. constipation    . bisoprolol-hydrochlorothiazide (ZIAC) 5-6.25 MG tablet Take 1 tablet by mouth 2 (two) times daily.    . cilostazol (PLETAL) 50 MG tablet Two tablets twice a day to equal 100 mg BID. 180 tablet 3  . fish oil-omega-3 fatty acids 1000 MG capsule Take 1 g by mouth 2 (two) times daily.     . fluticasone (FLONASE) 50 MCG/ACT nasal spray Place 2 sprays into the nose daily as needed for allergies.     . furosemide (LASIX) 40 MG tablet Take 40 mg by mouth daily.     . indomethacin (INDOCIN) 25 MG capsule Take 1 capsule by mouth 2 (two) times daily as needed.  0  . LANTUS 100 UNIT/ML injection Inject 110 Units into the skin at bedtime.   5  . magnesium oxide (MAG-OX) 400 MG tablet Take 200 mg by mouth 2 (two) times daily.     . metFORMIN (GLUCOPHAGE-XR) 500 MG 24 hr tablet Take 2 tablets (1,000 mg total) by mouth 2 (two) times daily.    [name] Neomycin-Bacitracin-Polymyxin (TRIPLE ANTIBIOTIC EX) Apply 1 application topically as needed (for wound).    . nitroGLYCERIN (NITROSTAT) 0.4 MG SL tablet DISSOLVE 1 TABLET UNDER THE TONGUE EVERY 5 MINUTES UP TO 3 DOSES 25 tablet 6  . NOVOLOG 100 UNIT/ML injection Inject 15-20 Units into the skin 3 (three) times daily. INJECT 45 UNITS INTO THE SKIN WITH BREAKFAST, INJECT 20 UNITS INTO THE SKIN AT LUNCH TIME, INJECT 55 UNITS INTO THE SKIN WITH SUPPER. ANY TIME YOUR BG ARE GREATER THAN 150, INJECT INTO THE SKIN AS DIRECTED PER SLIDING SCALE  5  . polyethylene glycol (MIRALAX / GLYCOLAX) packet Take 17 g by mouth daily as needed. For constipation    . potassium chloride SA (K-DUR,KLOR-CON) 20 MEQ tablet Take 20 mEq by mouth daily.    . RABEprazole (ACIPHEX) 20 MG tablet Take 20 mg by mouth daily.    [name] SYNTHROID 150 MCG tablet Take 150 mcg by mouth daily. TAKE 1 TABLET EVERYDAY EXCEPT TAKE 1 AND 1/2  TABLETS ON SUNDAYS.  5  . traMADol (ULTRAM) 50 MG tablet Take 50 mg by mouth every 8 (eight) hours as needed (PAIN).    . Vitamin D, Ergocalciferol, (DRISDOL) 50000 UNITS CAPS Take 50,000 Units by mouth every 7 (seven) days.     No current facility-administered medications for this visit.    Allergies:   Clonidine derivatives, Lisinopril, Losartan, Penicillins, Plavix [clopidogrel bisulfate], Quinapril hcl, Repaglinide, Toprol xl [metoprolol tartrate], Trovan [alatrofloxacin], Atorvastatin, Candesartan cilexetil, Clarithromycin, Clopidogrel bisulfate, Ezetimibe, Hydromorphone hcl, Metoclopramide hcl, Morphine and related, Oxycodone-acetaminophen, Paroxetine hcl, Pioglitazone, Valsartan, and Vancomycin    Social History:  The patient  reports  that she has been smoking cigarettes. She has a 43.00 pack-year smoking history. She has never used smokeless tobacco. She reports that she does not drink alcohol or use drugs.   Family History:  The patient's family history includes Anesthesia problems in her mother and sister; Diabetes in her maternal grandfather and sister; Heart Problems in her sister; Heart attack in her father; Lupus in her sister; Neuropathy in her father and sister.    ROS:  Please see the history of present illness.   Otherwise, review of systems are positive for none.   All other systems are reviewed and negative.    PHYSICAL EXAM: VS:  BP 140/74   Pulse 69   Ht 5' (1.524 m)   Wt 201 lb (91.2 kg)   SpO2 96%   BMI 39.26 kg/m  , BMI Body mass index is 39.26 kg/m. GEN: Well nourished, well developed, in no acute distress  HEENT: normal  Neck: no JVD, carotid bruits, or masses Cardiac: RRR; no  rubs, or gallops,no edema .  1 out of 6 systolic murmur at the base Respiratory:  clear to auscultation bilaterally, normal work of breathing GI: soft, nontender, nondistended, + BS MS: no deformity or atrophy  Skin: warm and dry, no rash Neuro:  Strength and sensation are  intact Psych: euthymic mood, full affect Vascular: Femoral pulses +1 bilaterally.  Distal pulses are not palpable.   EKG:  EKG is not ordered today.    Recent Labs: 04/08/2019: ALT 42; BUN 10; Creatinine, Ser 0.86; Hemoglobin 15.2; Platelets 202; Potassium 4.3; Sodium 135    Lipid Panel    Component Value Date/Time   CHOL 277 (H) 04/08/2019 1123   TRIG 321 (H) 04/08/2019 1123   TRIG 242 (HH) 04/08/2006 1520   HDL 41 04/08/2019 1123   CHOLHDL 6.8 (H) 04/08/2019 1123   CHOLHDL 7.4 CALC 09/27/2006 1500   VLDL 47 (H) 09/27/2006 1500   LDLCALC 174 (H) 04/08/2019 1123   LDLDIRECT 221.2 09/27/2006 1500      Wt Readings from Last 3 Encounters:  07/14/19 201 lb (91.2 kg)  04/08/19 200 lb 6.4 oz (90.9 kg)  05/27/18 198 lb 12.8 oz (90.2 kg)       PAD Screen 05/27/2018  Previous PAD dx? No  Previous surgical procedure? No  Pain with walking? Yes  Subsides with rest? Yes  Feet/toe relief with dangling? No  Painful, non-healing ulcers? No  Extremities discolored? No      ASSESSMENT AND PLAN:  1.  Peripheral arterial disease: The patient has evidence of SFA disease bilaterally worse on the right side.  However, her main complaint seems to be lower back pain with exertion.  I explained to her that SFA disease causes calf claudication and not buttock claudication.  Aortoiliac disease is a possibility given that her femoral pulses are diminished bilaterally.  I am going to obtain an aortoiliac duplex and repeat her lower extremity arterial Doppler and then reevaluate symptoms in 1 month.  She is willing to consider angiography at that time. She does report some improvement in symptoms with cilostazol.  2.  Hyperlipidemia: Intolerance to statins.  I recommend treatment with a PCSK9 inhibitor and she is meeting our pharmacist today.  3.  Tobacco use: I discussed the importance of smoking cessation and she is interested in a cessation class once the pandemic is under control.  She  reports being unsuccessful with nicotine patch.  4.  Coronary artery disease involving native coronary arteries: No significant angina.  5.  Essential hypertension, blood pressure is reasonably controlled    Disposition:   FU with me in 1 month  Signed,  Lorine Bears, MD  07/14/2019 2:26 PM    [facility]

## 2019-07-14 NOTE — Assessment & Plan Note (Signed)
Patient with hyperlipidemia, poorly controlled and not able to tolerate statins or ezetimibe.  She had considered PCSK-9 inhibitor therpay, but never followed thru.  Reviewed PCSK-9, bempedoic acid and inclisiran (It was noted by research that she would be a good candidate).  Discussed mechanisms of action, side effects and dosing routes.  Patient would like to think about these options.  She is to be scheduled for lower extremity dopplers in the next week, when she comes in for these she will let us know which option she will take.

## 2019-07-14 NOTE — Progress Notes (Signed)
07/14/2019 Courtney Barker May 10, 1956 [id number]   HPI:  Courtney Barker is a 64 y.o. female patient of Dr Martinique, who presents today for a lipid clinic evaluation.  See pertinent past medical history below.  Past Medical History: CAD/PAD BMS to RCA 2002, total RCA occlusion in 2015, DES x 2 to OM3 2017; intermittent claudication w/ abnormal ABI's   DM2 A1c 7.1 (03/2019) on Lantus 110 U/d, Novolog 15-20 U tid  hypothyroidism On synthroid 150 mcg  Vitamin D deficiency No recent labs, on 50,000 IU weekly  obesity BMI 39.14    Current Medications: none  Cholesterol Goals: LDL < 70   Intolerant/previously tried: multiple statins - muscle pains, increased LFT's; ezetimibe - GI bleed  Family history: The patient's family history includes Anesthesia problems in her mother and sister; Diabetes in her maternal grandfather and sister; Heart Problems in her sister; Heart attack in her father; Lupus in her sister; Neuropathy in her father and sister.  Labs:  03/31/18: CHO 240; TG 195; HDL 38; LDL-c 163   03/2019:  TC 277, TG 321, HDL 41, LDL 174   Current Outpatient Medications  Medication Sig Dispense Refill  . acetaminophen (TYLENOL) 500 MG tablet Take 1,000 mg by mouth every 6 (six) hours as needed (pain).     [name] albuterol (PROVENTIL HFA;VENTOLIN HFA) 108 (90 BASE) MCG/ACT inhaler Inhale 2 puffs into the lungs every 6 (six) hours as needed for wheezing or shortness of breath.    . allopurinol (ZYLOPRIM) 100 MG tablet Take 1 tablet by mouth 2 (two) times daily.  11  . aspirin 81 MG tablet Take 81 mg by mouth daily.    . bisacodyl (DULCOLAX) 5 MG EC tablet Take 5 mg by mouth daily as needed. constipation    . bisoprolol-hydrochlorothiazide (ZIAC) 5-6.25 MG tablet Take 1 tablet by mouth 2 (two) times daily.    . cilostazol (PLETAL) 50 MG tablet Two tablets twice a day to equal 100 mg BID. 180 tablet 3  . fish oil-omega-3 fatty acids 1000 MG capsule Take 1 g by mouth 2 (two) times daily.     .  fluticasone (FLONASE) 50 MCG/ACT nasal spray Place 2 sprays into the nose daily as needed for allergies.     . furosemide (LASIX) 40 MG tablet Take 40 mg by mouth daily.     . indomethacin (INDOCIN) 25 MG capsule Take 1 capsule by mouth 2 (two) times daily as needed.  0  . LANTUS 100 UNIT/ML injection Inject 110 Units into the skin at bedtime.   5  . magnesium oxide (MAG-OX) 400 MG tablet Take 200 mg by mouth 2 (two) times daily.     . metFORMIN (GLUCOPHAGE-XR) 500 MG 24 hr tablet Take 2 tablets (1,000 mg total) by mouth 2 (two) times daily.    [name] Neomycin-Bacitracin-Polymyxin (TRIPLE ANTIBIOTIC EX) Apply 1 application topically as needed (for wound).    . nitroGLYCERIN (NITROSTAT) 0.4 MG SL tablet DISSOLVE 1 TABLET UNDER THE TONGUE EVERY 5 MINUTES UP TO 3 DOSES 25 tablet 6  . NOVOLOG 100 UNIT/ML injection Inject 15-20 Units into the skin 3 (three) times daily. INJECT 45 UNITS INTO THE SKIN WITH BREAKFAST, INJECT 20 UNITS INTO THE SKIN AT LUNCH TIME, INJECT 55 UNITS INTO THE SKIN WITH SUPPER. ANY TIME YOUR BG ARE GREATER THAN 150, INJECT INTO THE SKIN AS DIRECTED PER SLIDING SCALE  5  . polyethylene glycol (MIRALAX / GLYCOLAX) packet Take 17 g by mouth daily as needed. For constipation    .  potassium chloride SA (K-DUR,KLOR-CON) 20 MEQ tablet Take 20 mEq by mouth daily.    . RABEprazole (ACIPHEX) 20 MG tablet Take 20 mg by mouth daily.    [name] SYNTHROID 150 MCG tablet Take 150 mcg by mouth daily. TAKE 1 TABLET EVERYDAY EXCEPT TAKE 1 AND 1/2 TABLETS ON SUNDAYS.  5  . traMADol (ULTRAM) 50 MG tablet Take 50 mg by mouth every 8 (eight) hours as needed (PAIN).    . Vitamin D, Ergocalciferol, (DRISDOL) 50000 UNITS CAPS Take 50,000 Units by mouth every 7 (seven) days.     No current facility-administered medications for this visit.    Allergies  Allergen Reactions  . Clonidine Derivatives Shortness Of Breath and Swelling  . Lisinopril Shortness Of Breath and Swelling  . Losartan Palpitations  .  Penicillins Anaphylaxis  . Plavix [Clopidogrel Bisulfate] Itching  . Quinapril Hcl Shortness Of Breath and Swelling  . Repaglinide Palpitations  . Toprol Xl [Metoprolol Tartrate] Shortness Of Breath and Swelling  . Trovan [Alatrofloxacin] Shortness Of Breath and Swelling  . Atorvastatin Other (See Comments)    myalgias  . Candesartan Cilexetil Other (See Comments)    Reaction unknown  . Clarithromycin Nausea And Vomiting  . Clopidogrel Bisulfate Other (See Comments)    Reaction unknown  . Ezetimibe Other (See Comments)    Reaction unknown  . Hydromorphone Hcl Nausea And Vomiting  . Metoclopramide Hcl Other (See Comments)    Reaction unknown  . Morphine And Related Nausea And Vomiting  . Oxycodone-Acetaminophen Other (See Comments)    Body feels like bee stings  . Paroxetine Hcl Other (See Comments)    Freezing, burning from inside out.  . Pioglitazone Other (See Comments) and Swelling    Reaction unknown  . Valsartan Other (See Comments)    Reaction unknown  . Vancomycin Other (See Comments)    Reaction unknown    Past Medical History:  Diagnosis Date  . Anxiety   . Arthritis    "head to toe; neck, hands, arms, knees, feet, ankles" (08/26/2015)  . Asthma   . Cervical spondylosis with myelopathy    s/p C5-6 and C6-7 Anterior cervical discectomy/decompression; C5-6 and C6-7 interbody arthrodesis with local morcellized autograft bone and Actifuse bone graft extender; insertion of interbody prosthesis at C5-6 and C6-7 (Zimmer peek interbody prosthesis); anterior cervical plating from C5-6 and C6-7 with globus titanium plate 07/21/27  . Chronic lower back pain   . COPD (chronic obstructive pulmonary disease) (HCC)   . Coronary artery disease    s/p BMS x3 RCA '02; repeat cath in '02 and '05 were without significant ISR and normal LV function; pt reported unremarkable nuclear myoview 2011/12  . Depression   . Fibromyalgia   . GERD (gastroesophageal reflux disease)   .  Headache(784.0)    "2-3 times/week" (08/26/2015)  . Heart murmur   . Hyperlipidemia    statin intolerant, to be followed by lipid clinic  . Hypertension   . Hypothyroidism   . Increased urinary protein excretion   . Myocardial infarction [facility]) 12/2013   "[place]"  . Neuromuscular disorder (HCC)   . PONV (postoperative nausea and vomiting)   . Sleep apnea    NO STUDY DONE .[name].   . Thyroid nodule   . Tobacco abuse   . Type II diabetes mellitus (HCC)     There were no vitals taken for this visit.   Mixed hyperlipidemia Patient with hyperlipidemia, poorly controlled and not able to tolerate statins or ezetimibe.  She had  considered PCSK-9 inhibitor therpay, but never followed thru.  Reviewed PCSK-9, bempedoic acid and inclisiran (It was noted by research that she would be a good candidate).  Discussed mechanisms of action, side effects and dosing routes.  Patient would like to think about these options.  She is to be scheduled for lower extremity dopplers in the next week, when she comes in for these she will let us know which option she will take.     Phillips Hay PharmD CPP [facility] [address] [place], [place] [address] [phone]

## 2019-07-14 NOTE — Patient Instructions (Signed)
Medication Instructions:  No changes *If you need a refill on your cardiac medications before your next appointment, please call your pharmacy*  Lab Work: None ordered If you have labs (blood work) drawn today and your tests are completely normal, you will receive your results only by: [name] MyChart Message (if you have MyChart) OR . A paper copy in the mail If you have any lab test that is abnormal or we need to change your treatment, we will call you to review the results.  Testing/Procedures: Your physician has requested that you have a lower extremity segmental doppler. This will take place at [address] [facility], [address].  Your physician has requested that you have an Aorta/Iliac Duplex. This will be take place at [address] [facility], [address].    No food after 11PM the night before.  Water is OK. (Don't drink liquids if you have been instructed not to for ANOTHER test).  Take two Extra-Strength Gas-X capsules at bedtime the night before test.   Take an additional two Extra-Strength Gas-X capsules three (3) hours before the test or first thing in the morning.    Avoid foods that produce bowel gas, for 24 hours prior to exam (see below).    No breakfast, no chewing gum, no smoking or carbonated beverages.  Patient may take morning medications with water.  Come in for test at least 15 minutes early to register.   Follow-Up: At [facility], you and your health needs are our priority.  As part of our continuing mission to provide you with exceptional heart care, we have created designated Provider Care Teams.  These Care Teams include your primary Cardiologist (physician) and Advanced Practice Providers (APPs -  Physician Assistants and Nurse Practitioners) who all work together to provide you with the care you need, when you need it.  Your next appointment:   1 month(s)  The format for your next appointment:   In Person  Provider:   Lorine Bears, MD

## 2019-07-22 ENCOUNTER — Other Ambulatory Visit: Payer: Self-pay | Admitting: Cardiovascular Disease

## 2019-07-22 DIAGNOSIS — I739 Peripheral vascular disease, unspecified: Secondary | ICD-10-CM

## 2019-07-29 ENCOUNTER — Ambulatory Visit (HOSPITAL_BASED_OUTPATIENT_CLINIC_OR_DEPARTMENT_OTHER)
Admission: RE | Admit: 2019-07-29 | Discharge: 2019-07-29 | Disposition: A | Discharge status: Home / Self Care | Payer: BC Managed Care – PPO | Source: Ambulatory Visit | Attending: Cardiovascular Disease | Admitting: Cardiovascular Disease

## 2019-07-29 ENCOUNTER — Ambulatory Visit (HOSPITAL_COMMUNITY)
Admission: RE | Admit: 2019-07-29 | Discharge: 2019-07-29 | Disposition: A | Discharge status: Home / Self Care | Payer: BC Managed Care – PPO | Source: Ambulatory Visit | Attending: Cardiovascular Disease | Admitting: Cardiovascular Disease

## 2019-07-29 ENCOUNTER — Other Ambulatory Visit: Payer: Self-pay

## 2019-07-29 ENCOUNTER — Telehealth: Payer: Self-pay | Admitting: Cardiovascular Disease

## 2019-07-29 DIAGNOSIS — I739 Peripheral vascular disease, unspecified: Secondary | ICD-10-CM | POA: Insufficient documentation

## 2019-07-29 NOTE — Telephone Encounter (Signed)
Called patient to schedule 1 month f/u with Dr Kirke Corin. She is coming here for testing today and will schedule appt while she is here.

## 2019-08-10 ENCOUNTER — Telehealth: Payer: Self-pay | Admitting: Cardiovascular Disease

## 2019-08-10 NOTE — Telephone Encounter (Signed)
Patient calling requesting her son Casimiro Needle to come with her to her appointment. She states he is her medical power of attorney.

## 2019-08-11 ENCOUNTER — Ambulatory Visit: Payer: BC Managed Care – PPO | Admitting: Cardiovascular Disease

## 2019-08-11 NOTE — Telephone Encounter (Signed)
The patient has rescheduled her appointment to 08/25/19

## 2019-08-11 NOTE — Telephone Encounter (Signed)
Noted thanks °

## 2019-08-25 ENCOUNTER — Ambulatory Visit: Payer: BC Managed Care – PPO | Admitting: Cardiovascular Disease

## 2019-09-08 ENCOUNTER — Ambulatory Visit: Payer: BC Managed Care – PPO | Admitting: Cardiovascular Disease

## 2019-09-08 ENCOUNTER — Other Ambulatory Visit: Payer: Self-pay

## 2019-09-08 ENCOUNTER — Encounter: Payer: Self-pay | Admitting: Cardiovascular Disease

## 2019-09-08 VITALS — BP 138/80 | HR 69 | Temp 97.0°F | Ht 60.0 in | Wt 197.2 lb

## 2019-09-08 DIAGNOSIS — E782 Mixed hyperlipidemia: Secondary | ICD-10-CM | POA: Diagnosis not present

## 2019-09-08 DIAGNOSIS — I739 Peripheral vascular disease, unspecified: Secondary | ICD-10-CM | POA: Diagnosis not present

## 2019-09-08 DIAGNOSIS — Z720 Tobacco use: Secondary | ICD-10-CM

## 2019-09-08 DIAGNOSIS — I25810 Atherosclerosis of coronary artery bypass graft(s) without angina pectoris: Secondary | ICD-10-CM

## 2019-09-08 DIAGNOSIS — I10 Essential (primary) hypertension: Secondary | ICD-10-CM | POA: Diagnosis not present

## 2019-09-08 NOTE — Progress Notes (Signed)
Cardiology Office Note   Date:  09/08/2019   ID:  Courtney Barker, DOB 09-Jul-1955, MRN [id number]  PCP:  Tempie Hoist, MD  Cardiologist: Dr. Martinique  No chief complaint on file.     History of Present Illness: Courtney Barker is a 64 y.o. female who presents for evaluation of peripheral arterial disease.  She was  She has known history of coronary artery disease with chronically occluded RCA, essential hypertension, hyperlipidemia, type 2 diabetes, COPD, tobacco use and anxiety disorder. She was seen by Dr. Gwenlyn Found in December 2019 for management of intermittent claudication and mildly reduced ABI bilaterally.  Duplex showed severe stenosis in the distal right SFA and moderate left SFA disease.   She reports occasional calf discomfort at rest but not necessarily with exertion.  Her biggest issue seems to be lower back pain with walking short distance. She denies chest pain but does complain of exertional dyspnea.  She continues to smoke half a pack per day. Repeated her vascular studies which showed an ABI of 0.73 on the right and 0.86 on the left.  These were stable from before.  Aortoiliac duplex showed borderline significant left, iliac artery stenosis and right external iliac artery stenosis.  In addition, she was noted to have significant left common femoral artery stenosis and severe distal right SFA stenosis. She is mostly bothered by low back pain radiating to her legs.  This happens in the standing position and improves with bending.  In addition, she does have exertional bilateral calf discomfort.   Past Medical History:  Diagnosis Date  . Anxiety   . Arthritis    "head to toe; neck, hands, arms, knees, feet, ankles" (08/26/2015)  . Asthma   . Cervical spondylosis with myelopathy    s/p C5-6 and C6-7 Anterior cervical discectomy/decompression; C5-6 and C6-7 interbody arthrodesis with local morcellized autograft bone and Actifuse bone graft extender; insertion of interbody  prosthesis at C5-6 and C6-7 (Zimmer peek interbody prosthesis); anterior cervical plating from C5-6 and C6-7 with globus titanium plate 08/23/11  . Chronic lower back pain   . COPD (chronic obstructive pulmonary disease) ([place])   . Coronary artery disease    s/p BMS x3 RCA '02; repeat cath in '02 and '05 were without significant ISR and normal LV function; pt reported unremarkable nuclear myoview 2011/12  . Depression   . Fibromyalgia   . GERD (gastroesophageal reflux disease)   . Headache(784.0)    "2-3 times/week" (08/26/2015)  . Heart murmur   . Hyperlipidemia    statin intolerant, to be followed by lipid clinic  . Hypertension   . Hypothyroidism   . Increased urinary protein excretion   . Myocardial infarction [facility]) 12/2013   "[place]"  . Neuromuscular disorder ([place])   . PONV (postoperative nausea and vomiting)   . Sleep apnea    NO STUDY DONE .[name].   . Thyroid nodule   . Tobacco abuse   . Type II diabetes mellitus ([place])     Past Surgical History:  Procedure Laterality Date  . ABDOMINAL HYSTERECTOMY  2000   "fibroids; took 1 ovary"  . ANTERIOR CERVICAL DECOMP/DISCECTOMY FUSION  08/23/2011   Procedure: ANTERIOR CERVICAL DECOMPRESSION/DISCECTOMY FUSION 2 LEVELS;  Surgeon: Ophelia Charter, MD;  Location: [place] [facility];  Service: Neurosurgery;  Laterality: N/A;  Anterior Cervical Five-Six/Six-Seven Decompression with Fusion with Interbody Prothesis, Plating, and Bonegraft  . BACK SURGERY    . BOWEL RESECTION  2007   . CARDIAC CATHETERIZATION N/A 08/26/2015  Procedure: Coronary Stent Intervention;  Surgeon: Peter M Martinique, MD;  Location: [place] [facility];  Service: Cardiovascular;  Laterality: N/A;  OM3 and circ  . CARDIAC CATHETERIZATION  2002; 2005   patent stents  . CARDIAC CATHETERIZATION  12/2013   CTO of the RCA ; moderate disease noted in the third OM and mild disease in the LAD; treated medically  . COLONOSCOPY W/ POLYPECTOMY    . CORONARY ANGIOPLASTY WITH STENT PLACEMENT   2002   s/p BMS x3 RCA   . MULTIPLE TOOTH EXTRACTIONS  2000  . TONSILLECTOMY    . TUBAL LIGATION  1981     Current Outpatient Medications  Medication Sig Dispense Refill  . acetaminophen (TYLENOL) 500 MG tablet Take 1,000 mg by mouth every 6 (six) hours as needed (pain).     [name] albuterol (PROVENTIL HFA;VENTOLIN HFA) 108 (90 BASE) MCG/ACT inhaler Inhale 2 puffs into the lungs every 6 (six) hours as needed for wheezing or shortness of breath.    . allopurinol (ZYLOPRIM) 100 MG tablet Take 1 tablet by mouth 2 (two) times daily.  11  . aspirin 81 MG tablet Take 81 mg by mouth daily.    . bisacodyl (DULCOLAX) 5 MG EC tablet Take 5 mg by mouth daily as needed. constipation    . cilostazol (PLETAL) 50 MG tablet Two tablets twice a day to equal 100 mg BID. 180 tablet 3  . fish oil-omega-3 fatty acids 1000 MG capsule Take 1 g by mouth 2 (two) times daily.     . fluticasone (FLONASE) 50 MCG/ACT nasal spray Place 2 sprays into the nose daily as needed for allergies.     . furosemide (LASIX) 40 MG tablet Take 40 mg by mouth daily.     . indomethacin (INDOCIN) 25 MG capsule Take 1 capsule by mouth 2 (two) times daily as needed.  0  . LANTUS 100 UNIT/ML injection Inject 110 Units into the skin at bedtime.   5  . magnesium oxide (MAG-OX) 400 MG tablet Take 200 mg by mouth 2 (two) times daily.     . metFORMIN (GLUCOPHAGE-XR) 500 MG 24 hr tablet Take 2 tablets (1,000 mg total) by mouth 2 (two) times daily.    [name] Neomycin-Bacitracin-Polymyxin (TRIPLE ANTIBIOTIC EX) Apply 1 application topically as needed (for wound).    . nitroGLYCERIN (NITROSTAT) 0.4 MG SL tablet DISSOLVE 1 TABLET UNDER THE TONGUE EVERY 5 MINUTES UP TO 3 DOSES 25 tablet 6  . NOVOLOG 100 UNIT/ML injection Inject 15-20 Units into the skin 3 (three) times daily. INJECT 45 UNITS INTO THE SKIN WITH BREAKFAST, INJECT 20 UNITS INTO THE SKIN AT LUNCH TIME, INJECT 55 UNITS INTO THE SKIN WITH SUPPER. ANY TIME YOUR BG ARE GREATER THAN 150, INJECT INTO THE  SKIN AS DIRECTED PER SLIDING SCALE  5  . polyethylene glycol (MIRALAX / GLYCOLAX) packet Take 17 g by mouth daily as needed. For constipation    . potassium chloride SA (K-DUR,KLOR-CON) 20 MEQ tablet Take 20 mEq by mouth daily.    [name] SYNTHROID 150 MCG tablet Take 150 mcg by mouth daily. TAKE 1 TABLET EVERYDAY EXCEPT TAKE 1 AND 1/2 TABLETS ON SUNDAYS.  5  . traMADol (ULTRAM) 50 MG tablet Take 50 mg by mouth every 8 (eight) hours as needed (PAIN).    . Vitamin D, Ergocalciferol, (DRISDOL) 50000 UNITS CAPS Take 50,000 Units by mouth every 7 (seven) days.    . bisoprolol-hydrochlorothiazide (ZIAC) 5-6.25 MG tablet Take 1 tablet by mouth 2 (  two) times daily.    . RABEprazole (ACIPHEX) 20 MG tablet Take 20 mg by mouth daily.     No current facility-administered medications for this visit.    Allergies:   Clonidine derivatives, Lisinopril, Losartan, Penicillins, Plavix [clopidogrel bisulfate], Quinapril hcl, Repaglinide, Toprol xl [metoprolol tartrate], Trovan [alatrofloxacin], Atorvastatin, Candesartan cilexetil, Clarithromycin, Clopidogrel bisulfate, Ezetimibe, Hydromorphone hcl, Metoclopramide hcl, Morphine and related, Oxycodone-acetaminophen, Paroxetine hcl, Pioglitazone, Valsartan, and Vancomycin    Social History:  The patient  reports that she has been smoking cigarettes. She has a 43.00 pack-year smoking history. She has never used smokeless tobacco. She reports that she does not drink alcohol or use drugs.   Family History:  The patient's family history includes Anesthesia problems in her mother and sister; Diabetes in her maternal grandfather and sister; Heart Problems in her sister; Heart attack in her father; Lupus in her sister; Neuropathy in her father and sister.    ROS:  Please see the history of present illness.   Otherwise, review of systems are positive for none.   All other systems are reviewed and negative.    PHYSICAL EXAM: VS:  BP 138/80   Pulse 69   Temp (!) 97 F (36.1 C)    Ht 5' (1.524 m)   Wt 197 lb 3.2 oz (89.4 kg)   SpO2 97%   BMI 38.51 kg/m  , BMI Body mass index is 38.51 kg/m. GEN: Well nourished, well developed, in no acute distress  HEENT: normal  Neck: no JVD, carotid bruits, or masses Cardiac: RRR; no  rubs, or gallops,no edema . 2/ 6 systolic murmur at the aortic area Respiratory:  clear to auscultation bilaterally, normal work of breathing GI: soft, nontender, nondistended, + BS MS: no deformity or atrophy  Skin: warm and dry, no rash Neuro:  Strength and sensation are intact Psych: euthymic mood, full affect Vascular: Femoral pulses +1 bilaterally.  Distal pulses are not palpable.   EKG:  EKG is ordered today. EKG showed sinus rhythm with PACs, left anterior fascicular block, minimal LVH.   Recent Labs: 04/08/2019: ALT 42; BUN 10; Creatinine, Ser 0.86; Hemoglobin 15.2; Platelets 202; Potassium 4.3; Sodium 135    Lipid Panel    Component Value Date/Time   CHOL 277 (H) 04/08/2019 1123   TRIG 321 (H) 04/08/2019 1123   TRIG 242 (HH) 04/08/2006 1520   HDL 41 04/08/2019 1123   CHOLHDL 6.8 (H) 04/08/2019 1123   CHOLHDL 7.4 CALC 09/27/2006 1500   VLDL 47 (H) 09/27/2006 1500   LDLCALC 174 (H) 04/08/2019 1123   LDLDIRECT 221.2 09/27/2006 1500      Wt Readings from Last 3 Encounters:  09/08/19 197 lb 3.2 oz (89.4 kg)  07/14/19 201 lb (91.2 kg)  04/08/19 200 lb 6.4 oz (90.9 kg)       PAD Screen 05/27/2018  Previous PAD dx? No  Previous surgical procedure? No  Pain with walking? Yes  Subsides with rest? Yes  Feet/toe relief with dangling? No  Painful, non-healing ulcers? No  Extremities discolored? No      ASSESSMENT AND PLAN:  1.  Peripheral arterial disease with intermittent claudication: The patient has borderline iliac disease, significant left common femoral artery stenosis and severe right SFA stenosis.  Some improvement with small dose cilostazol but she continues to be limited.  The plan is to proceed with  angiography and possible endovascular intervention but the patient wants to wait until she gets the COVID-19 vaccination.  2.  Possible spinal stenosis: I do  suspect that her lower back pain with radiation to her legs is likely related to spinal stenosis given worsening with standing and improvement with bending.  Imaging of the LS spine should be considered.  The patient is going to discuss with her primary care physician.  3.  Hyperlipidemia: Intolerance to statins.  I recommend treatment with a PCSK9 inhibitor.  The patient met our pharmacy and she is still thinking about risks and benefits.  4.  Tobacco use: I discussed the importance of smoking cessation and she is interested in a cessation class once the pandemic is under control.  She reports being unsuccessful with nicotine patch.  5.  Coronary artery disease involving native coronary arteries: No significant angina.  6.  Essential hypertension, blood pressure is reasonably controlled    Disposition:   FU with me in 2 months  Signed,  Kathlyn Sacramento, MD  09/08/2019 8:56 AM    [place]

## 2019-09-08 NOTE — Patient Instructions (Signed)
Medication Instructions:  No changes *If you need a refill on your cardiac medications before your next appointment, please call your pharmacy*   Lab Work: None ordered If you have labs (blood work) drawn today and your tests are completely normal, you will receive your results only by: MyChart Message (if you have MyChart) OR A paper copy in the mail If you have any lab test that is abnormal or we need to change your treatment, we will call you to review the results.   Testing/Procedures: None ordered   Follow-Up: At [facility], you and your health needs are our priority.  As part of our continuing mission to provide you with exceptional heart care, we have created designated Provider Care Teams.  These Care Teams include your primary Cardiologist (physician) and Advanced Practice Providers (APPs -  Physician Assistants and Nurse Practitioners) who all work together to provide you with the care you need, when you need it.  We recommend signing up for the patient portal called "MyChart".  Sign up information is provided on this After Visit Summary.  MyChart is used to connect with patients for Virtual Visits (Telemedicine).  Patients are able to view lab/test results, encounter notes, upcoming appointments, etc.  Non-urgent messages can be sent to your provider as well.   To learn more about what you can do with MyChart, go to [email or web address].    Your next appointment:   2 month(s)  The format for your next appointment:   In Person  Provider:   Muhammad Arida, MD   

## 2019-11-10 ENCOUNTER — Ambulatory Visit: Payer: BC Managed Care – PPO | Admitting: Cardiovascular Disease

## 2019-11-10 ENCOUNTER — Other Ambulatory Visit: Payer: Self-pay

## 2019-11-10 ENCOUNTER — Encounter: Payer: Self-pay | Admitting: Cardiovascular Disease

## 2019-11-10 VITALS — BP 162/82 | HR 63 | Ht 60.0 in | Wt 190.0 lb

## 2019-11-10 DIAGNOSIS — I739 Peripheral vascular disease, unspecified: Secondary | ICD-10-CM | POA: Diagnosis not present

## 2019-11-10 DIAGNOSIS — I10 Essential (primary) hypertension: Secondary | ICD-10-CM

## 2019-11-10 DIAGNOSIS — Z01818 Encounter for other preprocedural examination: Secondary | ICD-10-CM

## 2019-11-10 DIAGNOSIS — I2510 Atherosclerotic heart disease of native coronary artery without angina pectoris: Secondary | ICD-10-CM

## 2019-11-10 DIAGNOSIS — E785 Hyperlipidemia, unspecified: Secondary | ICD-10-CM | POA: Diagnosis not present

## 2019-11-10 DIAGNOSIS — Z720 Tobacco use: Secondary | ICD-10-CM

## 2019-11-10 NOTE — H&P (View-Only) (Signed)
Cardiology Office Note   Date:  11/10/2019   ID:  Courtney Barker, DOB 18-Jul-1955, MRN [id number]  PCP:  Martyn Malay, MD  Cardiologist: Dr. [place]  No chief complaint on file.     History of Present Illness: Courtney Barker is a 64 y.o. female who is here today for follow-up visit regarding peripheral arterial disease.   She has known history of coronary artery disease with chronically occluded RCA, essential hypertension, hyperlipidemia, type 2 diabetes, COPD, tobacco use and anxiety disorder. She was seen by Dr. Allyson Sabal in December 2019 for management of intermittent claudication and mildly reduced ABI bilaterally.  Duplex showed severe stenosis in the distal right SFA and moderate left SFA disease.  She said to go back now She reports occasional calf discomfort at rest but not necessarily with exertion.  Her biggest issue seems to be lower back pain with walking short distance. She denies chest pain but does complain of exertional dyspnea.  She continues to smoke half a pack per day. Most recent vascular studies showed an ABI of 0.73 on the right and 0.86 on the left.  These were stable from before.  Aortoiliac duplex showed borderline significant left common iliac artery stenosis and right external iliac artery stenosis.  In addition, she was noted to have significant left common femoral artery stenosis and severe distal right SFA stenosis. She continues to have bilateral leg discomfort worse on the right side.  She has significant numbness in her feet as well.   Past Medical History:  Diagnosis Date  . Anxiety   . Arthritis    "head to toe; neck, hands, arms, knees, feet, ankles" (08/26/2015)  . Asthma   . Cervical spondylosis with myelopathy    s/p C5-6 and C6-7 Anterior cervical discectomy/decompression; C5-6 and C6-7 interbody arthrodesis with local morcellized autograft bone and Actifuse bone graft extender; insertion of interbody prosthesis at C5-6 and C6-7 (Zimmer peek  interbody prosthesis); anterior cervical plating from C5-6 and C6-7 with globus titanium plate 02/20/16  . Chronic lower back pain   . COPD (chronic obstructive pulmonary disease) ([facility])   . Coronary artery disease    s/p BMS x3 RCA '02; repeat cath in '02 and '05 were without significant ISR and normal LV function; pt reported unremarkable nuclear myoview 2011/12  . Depression   . Fibromyalgia   . GERD (gastroesophageal reflux disease)   . Headache(784.0)    "2-3 times/week" (08/26/2015)  . Heart murmur   . Hyperlipidemia    statin intolerant, to be followed by lipid clinic  . Hypertension   . Hypothyroidism   . Increased urinary protein excretion   . Myocardial infarction [facility]) 12/2013   "[place]"  . Neuromuscular disorder ([facility])   . PONV (postoperative nausea and vomiting)   . Sleep apnea    NO STUDY DONE .[name].   . Thyroid nodule   . Tobacco abuse   . Type II diabetes mellitus ([facility])     Past Surgical History:  Procedure Laterality Date  . ABDOMINAL HYSTERECTOMY  2000   "fibroids; took 1 ovary"  . ANTERIOR CERVICAL DECOMP/DISCECTOMY FUSION  08/23/2011   Procedure: ANTERIOR CERVICAL DECOMPRESSION/DISCECTOMY FUSION 2 LEVELS;  Surgeon: Cristi Loron, MD;  Location: [facility];  Service: Neurosurgery;  Laterality: N/A;  Anterior Cervical Five-Six/Six-Seven Decompression with Fusion with Interbody Prothesis, Plating, and Bonegraft  . BACK SURGERY    . BOWEL RESECTION  2007   . CARDIAC CATHETERIZATION N/A 08/26/2015   Procedure: Coronary Stent  Intervention;  Surgeon: Peter M Martinique, MD;  Location: [place] [facility];  Service: Cardiovascular;  Laterality: N/A;  OM3 and circ  . CARDIAC CATHETERIZATION  2002; 2005   patent stents  . CARDIAC CATHETERIZATION  12/2013   CTO of the RCA ; moderate disease noted in the third OM and mild disease in the LAD; treated medically  . COLONOSCOPY W/ POLYPECTOMY    . CORONARY ANGIOPLASTY WITH STENT PLACEMENT  2002   s/p BMS x3 RCA   . MULTIPLE  TOOTH EXTRACTIONS  2000  . TONSILLECTOMY    . TUBAL LIGATION  1981     Current Outpatient Medications  Medication Sig Dispense Refill  . acetaminophen (TYLENOL) 500 MG tablet Take 1,000 mg by mouth every 6 (six) hours as needed (pain).     [name] albuterol (PROVENTIL HFA;VENTOLIN HFA) 108 (90 BASE) MCG/ACT inhaler Inhale 2 puffs into the lungs every 6 (six) hours as needed for wheezing or shortness of breath.    . allopurinol (ZYLOPRIM) 100 MG tablet Take 1 tablet by mouth 2 (two) times daily.  11  . aspirin 81 MG tablet Take 81 mg by mouth daily.    . bisacodyl (DULCOLAX) 5 MG EC tablet Take 5 mg by mouth daily as needed. constipation    . cilostazol (PLETAL) 50 MG tablet Two tablets twice a day to equal 100 mg BID. 180 tablet 3  . fish oil-omega-3 fatty acids 1000 MG capsule Take 1 g by mouth 2 (two) times daily.     . fluticasone (FLONASE) 50 MCG/ACT nasal spray Place 2 sprays into the nose daily as needed for allergies.     . furosemide (LASIX) 40 MG tablet Take 40 mg by mouth daily.     . indomethacin (INDOCIN) 25 MG capsule Take 1 capsule by mouth 2 (two) times daily as needed.  0  . LANTUS 100 UNIT/ML injection Inject 110 Units into the skin at bedtime.   5  . magnesium oxide (MAG-OX) 400 MG tablet Take 200 mg by mouth 2 (two) times daily.     . metFORMIN (GLUCOPHAGE-XR) 500 MG 24 hr tablet Take 2 tablets (1,000 mg total) by mouth 2 (two) times daily.    [name] Neomycin-Bacitracin-Polymyxin (TRIPLE ANTIBIOTIC EX) Apply 1 application topically as needed (for wound).    . nitroGLYCERIN (NITROSTAT) 0.4 MG SL tablet DISSOLVE 1 TABLET UNDER THE TONGUE EVERY 5 MINUTES UP TO 3 DOSES 25 tablet 6  . NOVOLOG 100 UNIT/ML injection Inject 15-20 Units into the skin 3 (three) times daily. INJECT 45 UNITS INTO THE SKIN WITH BREAKFAST, INJECT 20 UNITS INTO THE SKIN AT LUNCH TIME, INJECT 55 UNITS INTO THE SKIN WITH SUPPER. ANY TIME YOUR BG ARE GREATER THAN 150, INJECT INTO THE SKIN AS DIRECTED PER SLIDING SCALE   5  . polyethylene glycol (MIRALAX / GLYCOLAX) packet Take 17 g by mouth daily as needed. For constipation    . potassium chloride SA (K-DUR,KLOR-CON) 20 MEQ tablet Take 20 mEq by mouth daily.    [name] SYNTHROID 150 MCG tablet Take 150 mcg by mouth daily. TAKE 1 TABLET EVERYDAY EXCEPT TAKE 1 AND 1/2 TABLETS ON SUNDAYS.  5  . traMADol (ULTRAM) 50 MG tablet Take 50 mg by mouth every 8 (eight) hours as needed (PAIN).    . Vitamin D, Ergocalciferol, (DRISDOL) 50000 UNITS CAPS Take 50,000 Units by mouth every 7 (seven) days.    . bisoprolol-hydrochlorothiazide (ZIAC) 5-6.25 MG tablet Take 1 tablet by mouth 2 (two) times daily.    [name]  RABEprazole (ACIPHEX) 20 MG tablet Take 20 mg by mouth daily.     No current facility-administered medications for this visit.    Allergies:   Clonidine derivatives, Lisinopril, Losartan, Penicillins, Plavix [clopidogrel bisulfate], Quinapril hcl, Repaglinide, Toprol xl [metoprolol tartrate], Trovan [alatrofloxacin], Atorvastatin, Candesartan cilexetil, Clarithromycin, Clopidogrel bisulfate, Ezetimibe, Hydromorphone hcl, Metoclopramide hcl, Morphine and related, Oxycodone-acetaminophen, Paroxetine hcl, Pioglitazone, Valsartan, and Vancomycin    Social History:  The patient  reports that she has been smoking cigarettes. She has a 43.00 pack-year smoking history. She has never used smokeless tobacco. She reports that she does not drink alcohol or use drugs.   Family History:  The patient's family history includes Anesthesia problems in her mother and sister; Diabetes in her maternal grandfather and sister; Heart Problems in her sister; Heart attack in her father; Lupus in her sister; Neuropathy in her father and sister.    ROS:  Please see the history of present illness.   Otherwise, review of systems are positive for none.   All other systems are reviewed and negative.    PHYSICAL EXAM: VS:  BP (!) 162/82   Pulse 63   Ht 5' (1.524 m)   Wt 190 lb (86.2 kg)   SpO2 97%    BMI 37.11 kg/m  , BMI Body mass index is 37.11 kg/m. GEN: Well nourished, well developed, in no acute distress  HEENT: normal  Neck: no JVD, carotid bruits, or masses Cardiac: RRR; no  rubs, or gallops,no edema . 2/ 6 systolic murmur at the aortic area Respiratory:  clear to auscultation bilaterally, normal work of breathing GI: soft, nontender, nondistended, + BS MS: no deformity or atrophy  Skin: warm and dry, no rash Neuro:  Strength and sensation are intact Psych: euthymic mood, full affect Vascular: Femoral pulses +1 bilaterally.  Distal pulses are not palpable.   EKG:  EKG is not ordered today.    Recent Labs: 04/08/2019: ALT 42; BUN 10; Creatinine, Ser 0.86; Hemoglobin 15.2; Platelets 202; Potassium 4.3; Sodium 135    Lipid Panel    Component Value Date/Time   CHOL 277 (H) 04/08/2019 1123   TRIG 321 (H) 04/08/2019 1123   TRIG 242 (HH) 04/08/2006 1520   HDL 41 04/08/2019 1123   CHOLHDL 6.8 (H) 04/08/2019 1123   CHOLHDL 7.4 CALC 09/27/2006 1500   VLDL 47 (H) 09/27/2006 1500   LDLCALC 174 (H) 04/08/2019 1123   LDLDIRECT 221.2 09/27/2006 1500      Wt Readings from Last 3 Encounters:  11/10/19 190 lb (86.2 kg)  09/08/19 197 lb 3.2 oz (89.4 kg)  07/14/19 201 lb (91.2 kg)       PAD Screen 05/27/2018  Previous PAD dx? No  Previous surgical procedure? No  Pain with walking? Yes  Subsides with rest? Yes  Feet/toe relief with dangling? No  Painful, non-healing ulcers? No  Extremities discolored? No      ASSESSMENT AND PLAN:  1.  Peripheral arterial disease with intermittent claudication: The patient has borderline iliac disease, significant left common femoral artery stenosis and severe right SFA stenosis.  No significant improvement with cilostazol and she continues to be very limited with severe claudication.  Symptoms are worse on the right side.  I discontinued cilostazol given lack of improvement.  Given severity of her symptoms, I recommend proceeding  with abdominal aortogram with lower extremity runoff and possible endovascular intervention.  Symptoms are worse on the right side and thus planned access is via the left common femoral artery.  I  discussed the procedure in details as well as risks and benefits. The patient requests significant sedation during the procedure.  During her most recent cardiac catheterization, she received a total of 4 mg of Versed and 100 mcg of fentanyl.   She also has issues with dual antiplatelet therapy.  She is allergic to clopidogrel.  She previously took prasugrel but could not take a high dose.  She was taking 5 mg twice daily.    2.  Possible spinal stenosis: I do suspect that her lower back pain with radiation to her legs is likely related to spinal stenosis given worsening with standing and improvement with bending.  Imaging of the LS spine should be considered.  The patient is going to discuss with her primary care physician.  3.  Hyperlipidemia: Intolerance to statins.  I recommend treatment with a PCSK9 inhibitor.  The patient has not made up her mind if she wants to take a PCSK9 inhibitor.   4.  Tobacco use: I discussed the importance of smoking cessation and she is interested in a cessation class once the pandemic is under control.  She reports being unsuccessful with nicotine patch.  5.  Coronary artery disease involving native coronary arteries: No significant angina.  6.  Essential hypertension, blood pressure is mildly elevated controlled    Disposition:   FU with me in 1 months  Signed,  Lorine Bears, MD  11/10/2019 10:59 AM    [facility]

## 2019-11-10 NOTE — Patient Instructions (Addendum)
Medication Instructions:  STOP the Cilostazol (Pletal)  *If you need a refill on your cardiac medications before your next appointment, please call your pharmacy*  Testing/Procedures: Your physician has requested that you have a peripheral vascular angiogram. This exam is performed at the hospital. During this exam IV contrast is used to look at arterial blood flow. Please review the information sheet given for details.   Follow-Up: At [facility], you and your health needs are our priority.  As part of our continuing mission to provide you with exceptional heart care, we have created designated Provider Care Teams.  These Care Teams include your primary Cardiologist (physician) and Advanced Practice Providers (APPs -  Physician Assistants and Nurse Practitioners) who all work together to provide you with the care you need, when you need it.  We recommend signing up for the patient portal called "MyChart".  Sign up information is provided on this After Visit Summary.  MyChart is used to connect with patients for Virtual Visits (Telemedicine).  Patients are able to view lab/test results, encounter notes, upcoming appointments, etc.  Non-urgent messages can be sent to your provider as well.   To learn more about what you can do with MyChart, go to [email or web address].    Your next appointment:   Keep your post procedure follow up on 12/15/19 at 11:40 with Dr. Kirke Corin   Other Instructions    [facility]ASC[facility] [address] [place] [address] [place] [address] Dept: [phone] Loc: [phone]  VENISHA BOEHNING  11/10/2019  You are scheduled for a Peripheral Angiogram on Wednesday, June 9 with Dr. Lorine Bears.  1. Please arrive at the [facility] (Main Entrance A) at [facility]: [address] [place], [place] [address] at 8:30 AM (This time is two hours before your procedure to ensure your preparation). Free  valet parking service is available.   Special note: Every effort is made to have your procedure done on time. Please understand that emergencies sometimes delay scheduled procedures.  2. Diet: Do not eat solid foods after midnight.  The patient may have clear liquids until 5am upon the day of the procedure.  3. Labs: You will need to have blood drawn today (labs done on 11/05/19 in CareEverywhere)  You will need to have the coronavirus test completed prior to your procedure. An appointment has been made at 11:35 am on 11/21/19. This is a Drive Up Visit at the [facility] [address]. Someone will direct you to the appropriate testing line. Please tell them that you are there for procedure testing. Stay in your car and someone will be with you shortly. Please make sure to have all other labs completed before this test because you will need to stay quarantined until your procedure.  4. Medication instructions in preparation for your procedure: Hold the Metformin the morning of the procedure and then 48 hours after. Take half the dose of the Lantus the night before the procedure Hold all diabetic medication the morning of the procedure Hold the Furosemide and the Potassium the morning of the procedure.   On the morning of your procedure, take your Aspirin and any morning medicines NOT listed above.  You may use sips of water.  5. Plan for one night stay--bring personal belongings. 6. Bring a current list of your medications and current insurance cards. 7. You MUST have a responsible person to drive you home. 8. Someone MUST be with you  the first 24 hours after you arrive home or your discharge will be delayed. 9. Please wear clothes that are easy to get on and off and wear slip-on shoes.  Thank you for allowing [place] to care for you!   --  Invasive Cardiovascular services

## 2019-11-10 NOTE — Progress Notes (Signed)
  Cardiology Office Note   Date:  11/10/2019   ID:  Courtney Barker, DOB 03/09/1956, MRN [id number]  PCP:  Ragonesi, Peter, MD  Cardiologist: Dr. Jordan  No chief complaint on file.     History of Present Illness: Courtney Barker is a 64 y.o. female who is here today for follow-up visit regarding peripheral arterial disease.   She has known history of coronary artery disease with chronically occluded RCA, essential hypertension, hyperlipidemia, type 2 diabetes, COPD, tobacco use and anxiety disorder. She was seen by Dr. Berry in December 2019 for management of intermittent claudication and mildly reduced ABI bilaterally.  Duplex showed severe stenosis in the distal right SFA and moderate left SFA disease.  She said to go back now She reports occasional calf discomfort at rest but not necessarily with exertion.  Her biggest issue seems to be lower back pain with walking short distance. She denies chest pain but does complain of exertional dyspnea.  She continues to smoke half a pack per day. Most recent vascular studies showed an ABI of 0.73 on the right and 0.86 on the left.  These were stable from before.  Aortoiliac duplex showed borderline significant left common iliac artery stenosis and right external iliac artery stenosis.  In addition, she was noted to have significant left common femoral artery stenosis and severe distal right SFA stenosis. She continues to have bilateral leg discomfort worse on the right side.  She has significant numbness in her feet as well.   Past Medical History:  Diagnosis Date  . Anxiety   . Arthritis    "head to toe; neck, hands, arms, knees, feet, ankles" (08/26/2015)  . Asthma   . Cervical spondylosis with myelopathy    s/p C5-6 and C6-7 Anterior cervical discectomy/decompression; C5-6 and C6-7 interbody arthrodesis with local morcellized autograft bone and Actifuse bone graft extender; insertion of interbody prosthesis at C5-6 and C6-7 (Zimmer peek  interbody prosthesis); anterior cervical plating from C5-6 and C6-7 with globus titanium plate 08/23/11  . Chronic lower back pain   . COPD (chronic obstructive pulmonary disease) (HCC)   . Coronary artery disease    s/p BMS x3 RCA '02; repeat cath in '02 and '05 were without significant ISR and normal LV function; pt reported unremarkable nuclear myoview 2011/12  . Depression   . Fibromyalgia   . GERD (gastroesophageal reflux disease)   . Headache(784.0)    "2-3 times/week" (08/26/2015)  . Heart murmur   . Hyperlipidemia    statin intolerant, to be followed by lipid clinic  . Hypertension   . Hypothyroidism   . Increased urinary protein excretion   . Myocardial infarction (HCC) 12/2013   "[place]"  . Neuromuscular disorder (HCC)   . PONV (postoperative nausea and vomiting)   . Sleep apnea    NO STUDY DONE ...   . Thyroid nodule   . Tobacco abuse   . Type II diabetes mellitus (HCC)     Past Surgical History:  Procedure Laterality Date  . ABDOMINAL HYSTERECTOMY  2000   "fibroids; took 1 ovary"  . ANTERIOR CERVICAL DECOMP/DISCECTOMY FUSION  08/23/2011   Procedure: ANTERIOR CERVICAL DECOMPRESSION/DISCECTOMY FUSION 2 LEVELS;  Surgeon: Jeffrey D Jenkins, MD;  Location: [facility];  Service: Neurosurgery;  Laterality: N/A;  Anterior Cervical Five-Six/Six-Seven Decompression with Fusion with Interbody Prothesis, Plating, and Bonegraft  . BACK SURGERY    . BOWEL RESECTION  2007   . CARDIAC CATHETERIZATION N/A 08/26/2015   Procedure: Coronary Stent   Intervention;  Surgeon: Peter M Martinique, MD;  Location: [place] [facility];  Service: Cardiovascular;  Laterality: N/A;  OM3 and circ  . CARDIAC CATHETERIZATION  2002; 2005   patent stents  . CARDIAC CATHETERIZATION  12/2013   CTO of the RCA ; moderate disease noted in the third OM and mild disease in the LAD; treated medically  . COLONOSCOPY W/ POLYPECTOMY    . CORONARY ANGIOPLASTY WITH STENT PLACEMENT  2002   s/p BMS x3 RCA   . MULTIPLE  TOOTH EXTRACTIONS  2000  . TONSILLECTOMY    . TUBAL LIGATION  1981     Current Outpatient Medications  Medication Sig Dispense Refill  . acetaminophen (TYLENOL) 500 MG tablet Take 1,000 mg by mouth every 6 (six) hours as needed (pain).     [name] albuterol (PROVENTIL HFA;VENTOLIN HFA) 108 (90 BASE) MCG/ACT inhaler Inhale 2 puffs into the lungs every 6 (six) hours as needed for wheezing or shortness of breath.    . allopurinol (ZYLOPRIM) 100 MG tablet Take 1 tablet by mouth 2 (two) times daily.  11  . aspirin 81 MG tablet Take 81 mg by mouth daily.    . bisacodyl (DULCOLAX) 5 MG EC tablet Take 5 mg by mouth daily as needed. constipation    . cilostazol (PLETAL) 50 MG tablet Two tablets twice a day to equal 100 mg BID. 180 tablet 3  . fish oil-omega-3 fatty acids 1000 MG capsule Take 1 g by mouth 2 (two) times daily.     . fluticasone (FLONASE) 50 MCG/ACT nasal spray Place 2 sprays into the nose daily as needed for allergies.     . furosemide (LASIX) 40 MG tablet Take 40 mg by mouth daily.     . indomethacin (INDOCIN) 25 MG capsule Take 1 capsule by mouth 2 (two) times daily as needed.  0  . LANTUS 100 UNIT/ML injection Inject 110 Units into the skin at bedtime.   5  . magnesium oxide (MAG-OX) 400 MG tablet Take 200 mg by mouth 2 (two) times daily.     . metFORMIN (GLUCOPHAGE-XR) 500 MG 24 hr tablet Take 2 tablets (1,000 mg total) by mouth 2 (two) times daily.    [name] Neomycin-Bacitracin-Polymyxin (TRIPLE ANTIBIOTIC EX) Apply 1 application topically as needed (for wound).    . nitroGLYCERIN (NITROSTAT) 0.4 MG SL tablet DISSOLVE 1 TABLET UNDER THE TONGUE EVERY 5 MINUTES UP TO 3 DOSES 25 tablet 6  . NOVOLOG 100 UNIT/ML injection Inject 15-20 Units into the skin 3 (three) times daily. INJECT 45 UNITS INTO THE SKIN WITH BREAKFAST, INJECT 20 UNITS INTO THE SKIN AT LUNCH TIME, INJECT 55 UNITS INTO THE SKIN WITH SUPPER. ANY TIME YOUR BG ARE GREATER THAN 150, INJECT INTO THE SKIN AS DIRECTED PER SLIDING SCALE   5  . polyethylene glycol (MIRALAX / GLYCOLAX) packet Take 17 g by mouth daily as needed. For constipation    . potassium chloride SA (K-DUR,KLOR-CON) 20 MEQ tablet Take 20 mEq by mouth daily.    [name] SYNTHROID 150 MCG tablet Take 150 mcg by mouth daily. TAKE 1 TABLET EVERYDAY EXCEPT TAKE 1 AND 1/2 TABLETS ON SUNDAYS.  5  . traMADol (ULTRAM) 50 MG tablet Take 50 mg by mouth every 8 (eight) hours as needed (PAIN).    . Vitamin D, Ergocalciferol, (DRISDOL) 50000 UNITS CAPS Take 50,000 Units by mouth every 7 (seven) days.    . bisoprolol-hydrochlorothiazide (ZIAC) 5-6.25 MG tablet Take 1 tablet by mouth 2 (two) times daily.    [name]  RABEprazole (ACIPHEX) 20 MG tablet Take 20 mg by mouth daily.     No current facility-administered medications for this visit.    Allergies:   Clonidine derivatives, Lisinopril, Losartan, Penicillins, Plavix [clopidogrel bisulfate], Quinapril hcl, Repaglinide, Toprol xl [metoprolol tartrate], Trovan [alatrofloxacin], Atorvastatin, Candesartan cilexetil, Clarithromycin, Clopidogrel bisulfate, Ezetimibe, Hydromorphone hcl, Metoclopramide hcl, Morphine and related, Oxycodone-acetaminophen, Paroxetine hcl, Pioglitazone, Valsartan, and Vancomycin    Social History:  The patient  reports that she has been smoking cigarettes. She has a 43.00 pack-year smoking history. She has never used smokeless tobacco. She reports that she does not drink alcohol or use drugs.   Family History:  The patient's family history includes Anesthesia problems in her mother and sister; Diabetes in her maternal grandfather and sister; Heart Problems in her sister; Heart attack in her father; Lupus in her sister; Neuropathy in her father and sister.    ROS:  Please see the history of present illness.   Otherwise, review of systems are positive for none.   All other systems are reviewed and negative.    PHYSICAL EXAM: VS:  BP (!) 162/82   Pulse 63   Ht 5' (1.524 m)   Wt 190 lb (86.2 kg)   SpO2 97%    BMI 37.11 kg/m  , BMI Body mass index is 37.11 kg/m. GEN: Well nourished, well developed, in no acute distress  HEENT: normal  Neck: no JVD, carotid bruits, or masses Cardiac: RRR; no  rubs, or gallops,no edema . 2/ 6 systolic murmur at the aortic area Respiratory:  clear to auscultation bilaterally, normal work of breathing GI: soft, nontender, nondistended, + BS MS: no deformity or atrophy  Skin: warm and dry, no rash Neuro:  Strength and sensation are intact Psych: euthymic mood, full affect Vascular: Femoral pulses +1 bilaterally.  Distal pulses are not palpable.   EKG:  EKG is not ordered today.    Recent Labs: 04/08/2019: ALT 42; BUN 10; Creatinine, Ser 0.86; Hemoglobin 15.2; Platelets 202; Potassium 4.3; Sodium 135    Lipid Panel    Component Value Date/Time   CHOL 277 (H) 04/08/2019 1123   TRIG 321 (H) 04/08/2019 1123   TRIG 242 (HH) 04/08/2006 1520   HDL 41 04/08/2019 1123   CHOLHDL 6.8 (H) 04/08/2019 1123   CHOLHDL 7.4 CALC 09/27/2006 1500   VLDL 47 (H) 09/27/2006 1500   LDLCALC 174 (H) 04/08/2019 1123   LDLDIRECT 221.2 09/27/2006 1500      Wt Readings from Last 3 Encounters:  11/10/19 190 lb (86.2 kg)  09/08/19 197 lb 3.2 oz (89.4 kg)  07/14/19 201 lb (91.2 kg)       PAD Screen 05/27/2018  Previous PAD dx? No  Previous surgical procedure? No  Pain with walking? Yes  Subsides with rest? Yes  Feet/toe relief with dangling? No  Painful, non-healing ulcers? No  Extremities discolored? No      ASSESSMENT AND PLAN:  1.  Peripheral arterial disease with intermittent claudication: The patient has borderline iliac disease, significant left common femoral artery stenosis and severe right SFA stenosis.  No significant improvement with cilostazol and she continues to be very limited with severe claudication.  Symptoms are worse on the right side.  I discontinued cilostazol given lack of improvement.  Given severity of her symptoms, I recommend proceeding  with abdominal aortogram with lower extremity runoff and possible endovascular intervention.  Symptoms are worse on the right side and thus planned access is via the left common femoral artery.  I  discussed the procedure in details as well as risks and benefits. The patient requests significant sedation during the procedure.  During her most recent cardiac catheterization, she received a total of 4 mg of Versed and 100 mcg of fentanyl.   She also has issues with dual antiplatelet therapy.  She is allergic to clopidogrel.  She previously took prasugrel but could not take a high dose.  She was taking 5 mg twice daily.    2.  Possible spinal stenosis: I do suspect that her lower back pain with radiation to her legs is likely related to spinal stenosis given worsening with standing and improvement with bending.  Imaging of the LS spine should be considered.  The patient is going to discuss with her primary care physician.  3.  Hyperlipidemia: Intolerance to statins.  I recommend treatment with a PCSK9 inhibitor.  The patient has not made up her mind if she wants to take a PCSK9 inhibitor.   4.  Tobacco use: I discussed the importance of smoking cessation and she is interested in a cessation class once the pandemic is under control.  She reports being unsuccessful with nicotine patch.  5.  Coronary artery disease involving native coronary arteries: No significant angina.  6.  Essential hypertension, blood pressure is mildly elevated controlled    Disposition:   FU with me in 1 months  Signed,  Lorine Bears, MD  11/10/2019 10:59 AM    [facility]

## 2019-11-21 ENCOUNTER — Other Ambulatory Visit (HOSPITAL_COMMUNITY)
Admission: RE | Admit: 2019-11-21 | Discharge: 2019-11-21 | Disposition: A | Discharge status: Home / Self Care | Payer: BC Managed Care – PPO | Source: Ambulatory Visit | Attending: Cardiovascular Disease | Admitting: Cardiovascular Disease

## 2019-11-21 DIAGNOSIS — Z20822 Contact with and (suspected) exposure to covid-19: Secondary | ICD-10-CM | POA: Diagnosis not present

## 2019-11-21 DIAGNOSIS — Z01812 Encounter for preprocedural laboratory examination: Secondary | ICD-10-CM | POA: Insufficient documentation

## 2019-11-21 LAB — SARS CORONAVIRUS 2 (TAT 6-24 HRS): SARS Coronavirus 2: NEGATIVE

## 2019-11-24 ENCOUNTER — Telehealth: Payer: Self-pay | Admitting: *Deleted

## 2019-11-24 NOTE — Telephone Encounter (Addendum)
Pt contacted pre-abdominal aortogram  scheduled at [facility] for: Wednesday November 25, 2019 10:30 AM Verified arrival time and place: [facility] Main Entrance A [facility]) at: 8:30 AM   No solid food after midnight prior to cath, clear liquids until 5 AM day of procedure.  Hold: Insulin-AM of procedure/pt states she does not use Insulin HS Metformin-day of procedure and 48 hours post procedure Lasix/KCl-AM of procedure Bisoprolol/HCT-AM of procedure  Except hold medications AM meds can be  taken pre-cath with sip of water including: ASA 81 mg   Confirmed patient has responsible adult to drive home post procedure and observe 24 hours after arriving home: yes  You are allowed ONE visitor in the waiting room during your procedure. Both you and your visitor must wear masks.      COVID-19 Pre-Screening Questions:  . In the past 7 to 10 days have you had a cough,  shortness of breath, headache, congestion, fever (100 or greater) body aches, chills, sore throat, or sudden loss of taste or sense of smell? no . Have you been around anyone with known Covid 19 in the past 7 to 10 days? no . Have you been around anyone who is awaiting Covid 19 test results in the past 7 to 10 days? no . Have you been around anyone who has mentioned symptoms of Covid 19 within the past 7 to 10 days? No  Reviewed procedure/mask/visitor instructions, COVID-19 screening questions with patient.

## 2019-11-25 ENCOUNTER — Other Ambulatory Visit: Payer: Self-pay

## 2019-11-25 ENCOUNTER — Encounter (HOSPITAL_COMMUNITY)
Admission: RE | Disposition: A | Discharge status: Home / Self Care | Payer: Self-pay | Source: Home / Self Care | Attending: Cardiovascular Disease

## 2019-11-25 ENCOUNTER — Ambulatory Visit (HOSPITAL_COMMUNITY)
Admission: RE | Admit: 2019-11-25 | Discharge: 2019-11-25 | Disposition: A | Discharge status: Home / Self Care | Payer: BC Managed Care – PPO | Attending: Cardiovascular Disease | Admitting: Cardiovascular Disease

## 2019-11-25 DIAGNOSIS — K219 Gastro-esophageal reflux disease without esophagitis: Secondary | ICD-10-CM | POA: Diagnosis not present

## 2019-11-25 DIAGNOSIS — Z888 Allergy status to other drugs, medicaments and biological substances status: Secondary | ICD-10-CM | POA: Insufficient documentation

## 2019-11-25 DIAGNOSIS — I2510 Atherosclerotic heart disease of native coronary artery without angina pectoris: Secondary | ICD-10-CM | POA: Diagnosis not present

## 2019-11-25 DIAGNOSIS — Z79899 Other long term (current) drug therapy: Secondary | ICD-10-CM | POA: Insufficient documentation

## 2019-11-25 DIAGNOSIS — F17210 Nicotine dependence, cigarettes, uncomplicated: Secondary | ICD-10-CM | POA: Diagnosis not present

## 2019-11-25 DIAGNOSIS — E785 Hyperlipidemia, unspecified: Secondary | ICD-10-CM | POA: Insufficient documentation

## 2019-11-25 DIAGNOSIS — Z885 Allergy status to narcotic agent status: Secondary | ICD-10-CM | POA: Diagnosis not present

## 2019-11-25 DIAGNOSIS — Z7982 Long term (current) use of aspirin: Secondary | ICD-10-CM | POA: Insufficient documentation

## 2019-11-25 DIAGNOSIS — F329 Major depressive disorder, single episode, unspecified: Secondary | ICD-10-CM | POA: Insufficient documentation

## 2019-11-25 DIAGNOSIS — I771 Stricture of artery: Secondary | ICD-10-CM | POA: Diagnosis not present

## 2019-11-25 DIAGNOSIS — I70213 Atherosclerosis of native arteries of extremities with intermittent claudication, bilateral legs: Secondary | ICD-10-CM | POA: Insufficient documentation

## 2019-11-25 DIAGNOSIS — J449 Chronic obstructive pulmonary disease, unspecified: Secondary | ICD-10-CM | POA: Insufficient documentation

## 2019-11-25 DIAGNOSIS — E039 Hypothyroidism, unspecified: Secondary | ICD-10-CM | POA: Insufficient documentation

## 2019-11-25 DIAGNOSIS — F419 Anxiety disorder, unspecified: Secondary | ICD-10-CM | POA: Insufficient documentation

## 2019-11-25 DIAGNOSIS — I252 Old myocardial infarction: Secondary | ICD-10-CM | POA: Diagnosis not present

## 2019-11-25 DIAGNOSIS — M797 Fibromyalgia: Secondary | ICD-10-CM | POA: Diagnosis not present

## 2019-11-25 DIAGNOSIS — E1151 Type 2 diabetes mellitus with diabetic peripheral angiopathy without gangrene: Secondary | ICD-10-CM | POA: Diagnosis not present

## 2019-11-25 DIAGNOSIS — Z880 Allergy status to penicillin: Secondary | ICD-10-CM | POA: Insufficient documentation

## 2019-11-25 DIAGNOSIS — Z794 Long term (current) use of insulin: Secondary | ICD-10-CM | POA: Insufficient documentation

## 2019-11-25 DIAGNOSIS — I739 Peripheral vascular disease, unspecified: Secondary | ICD-10-CM

## 2019-11-25 DIAGNOSIS — I10 Essential (primary) hypertension: Secondary | ICD-10-CM | POA: Diagnosis not present

## 2019-11-25 DIAGNOSIS — Z881 Allergy status to other antibiotic agents status: Secondary | ICD-10-CM | POA: Insufficient documentation

## 2019-11-25 HISTORY — PX: ABDOMINAL AORTOGRAM W/LOWER EXTREMITY: CATH118223

## 2019-11-25 LAB — GLUCOSE, CAPILLARY
Glucose-Capillary: 145 mg/dL — ABNORMAL HIGH (ref 70–99)
Glucose-Capillary: 140 mg/dL — ABNORMAL HIGH (ref 70–99)

## 2019-11-25 SURGERY — ABDOMINAL AORTOGRAM W/LOWER EXTREMITY
Anesthesia: LOCAL

## 2019-11-25 MED ORDER — IODIXANOL 320 MG/ML IV SOLN
INTRAVENOUS | Status: DC | PRN
  Administered 2019-11-25: 103 mL

## 2019-11-25 MED ORDER — LIDOCAINE HCL (PF) 1 % IJ SOLN
INTRAMUSCULAR | Status: AC
  Filled 2019-11-25: qty 30

## 2019-11-25 MED ORDER — SODIUM CHLORIDE 0.9% FLUSH: 3.0000 mL | Freq: Two times a day (BID) | INTRAVENOUS | Status: DC

## 2019-11-25 MED ORDER — SODIUM CHLORIDE 0.9 % IV SOLN: INTRAVENOUS | Status: AC

## 2019-11-25 MED ORDER — ASPIRIN 81 MG PO CHEW: 81.0000 mg | CHEWABLE_TABLET | ORAL | Status: DC

## 2019-11-25 MED ORDER — SODIUM CHLORIDE 0.9 % IV SOLN: 250.0000 mL | INTRAVENOUS | Status: DC | PRN

## 2019-11-25 MED ORDER — HEPARIN (PORCINE) IN NACL 1000-0.9 UT/500ML-% IV SOLN
INTRAVENOUS | Status: DC | PRN
  Administered 2019-11-25: 500 mL

## 2019-11-25 MED ORDER — FENTANYL CITRATE (PF) 100 MCG/2ML IJ SOLN
INTRAMUSCULAR | Status: AC
  Filled 2019-11-25: qty 2

## 2019-11-25 MED ORDER — MIDAZOLAM HCL 2 MG/2ML IJ SOLN
INTRAMUSCULAR | Status: DC | PRN
  Administered 2019-11-25: 1 mg via INTRAVENOUS
  Administered 2019-11-25: 2 mg via INTRAVENOUS

## 2019-11-25 MED ORDER — HEPARIN (PORCINE) IN NACL 1000-0.9 UT/500ML-% IV SOLN
INTRAVENOUS | Status: AC
  Filled 2019-11-25: qty 1000

## 2019-11-25 MED ORDER — ONDANSETRON HCL 4 MG/2ML IJ SOLN: 4.0000 mg | Freq: Four times a day (QID) | INTRAMUSCULAR | Status: DC | PRN

## 2019-11-25 MED ORDER — FENTANYL CITRATE (PF) 100 MCG/2ML IJ SOLN
INTRAMUSCULAR | Status: DC | PRN
  Administered 2019-11-25: 25 ug via INTRAVENOUS
  Administered 2019-11-25 (×2): 50 ug via INTRAVENOUS

## 2019-11-25 MED ORDER — SODIUM CHLORIDE 0.9 % IV SOLN: INTRAVENOUS | Status: DC

## 2019-11-25 MED ORDER — LIDOCAINE HCL (PF) 1 % IJ SOLN
INTRAMUSCULAR | Status: DC | PRN
  Administered 2019-11-25: 15 mL

## 2019-11-25 MED ORDER — MIDAZOLAM HCL 2 MG/2ML IJ SOLN
INTRAMUSCULAR | Status: AC
  Filled 2019-11-25: qty 2

## 2019-11-25 MED ORDER — ACETAMINOPHEN 325 MG PO TABS
ORAL_TABLET | ORAL | Status: AC
  Filled 2019-11-25: qty 2

## 2019-11-25 MED ORDER — SODIUM CHLORIDE 0.9% FLUSH: 3.0000 mL | INTRAVENOUS | Status: DC | PRN

## 2019-11-25 MED ORDER — ACETAMINOPHEN 325 MG PO TABS
650.0000 mg | ORAL_TABLET | ORAL | Status: DC | PRN
  Administered 2019-11-25: 650 mg via ORAL

## 2019-11-25 MED ORDER — HYDRALAZINE HCL 20 MG/ML IJ SOLN: 5.0000 mg | INTRAMUSCULAR | Status: DC | PRN

## 2019-11-25 SURGICAL SUPPLY — 11 items
CATH ANGIO 5F PIGTAIL 65CM (CATHETERS) ×2 IMPLANT
CATH CROSS OVER TEMPO 5F (CATHETERS) ×2 IMPLANT
CATH SOFT-VU 4F 65 STRAIGHT (CATHETERS) ×1 IMPLANT
CATH SOFT-VU STRAIGHT 4F 65CM (CATHETERS) ×2
KIT MICROINTRODUCER 5F 7206 (SHEATH) ×2 IMPLANT
KIT PV (KITS) ×2 IMPLANT
SHEATH PINNACLE 5F 10CM (SHEATH) ×2 IMPLANT
SYR MEDRAD MARK 7 150ML (SYRINGE) ×2 IMPLANT
TRANSDUCER W/STOPCOCK (MISCELLANEOUS) ×2 IMPLANT
TRAY PV CATH (CUSTOM PROCEDURE TRAY) ×2 IMPLANT
WIRE BENTSON .035X145CM (WIRE) ×2 IMPLANT

## 2019-11-25 NOTE — Consult Note (Signed)
Hospital Consult    Reason for Consult:  Evaluate for bilateral common femoral endarterectomies in setting of bilateral lower extremity claudication Referring Physician:  Dr. Fletcher Anon MRN #:  [id number]  History of Present Illness: This is a 64 y.o. female with history of hypertension, hyperlipidemia, diabetes, obesity, COPD, coronary artery disease status post remote PCI that vascular surgery is consulted for evaluation of bilateral femoral endarterectomies in the setting of lower extremity claudication.  Patient has been under the care of Dr. Fletcher Anon and underwent left common femoral access today with aortogram lower extremity runoff.  She reports having pain in her legs when she walks for the last 3 months and it has been disabling.  No rest pain at night, no tissue loss.  She has never had any other lower extremity interventions other than heart caths through her groins.  She states her right leg is worse.  On arteriogram today she had moderate bilateral iliac disease but no significant gradient on pullback pressures.  The right lower extremity showed severely stenosed right common femoral artery with a short distal SFA occlusion and three-vessel runoff.  The left leg had a severe common femoral stenosis with patent three-vessel runoff.  States shes takes an aspirin. Active tobacco abuse.    Past Medical History:  Diagnosis Date  . Anxiety   . Arthritis    "head to toe; neck, hands, arms, knees, feet, ankles" (08/26/2015)  . Asthma   . Cervical spondylosis with myelopathy    s/p C5-6 and C6-7 Anterior cervical discectomy/decompression; C5-6 and C6-7 interbody arthrodesis with local morcellized autograft bone and Actifuse bone graft extender; insertion of interbody prosthesis at C5-6 and C6-7 (Zimmer peek interbody prosthesis); anterior cervical plating from C5-6 and C6-7 with globus titanium plate 08/23/11  . Chronic lower back pain   . COPD (chronic obstructive pulmonary disease) ([place])   . Coronary  artery disease    s/p BMS x3 RCA '02; repeat cath in '02 and '05 were without significant ISR and normal LV function; pt reported unremarkable nuclear myoview 2011/12  . Depression   . Fibromyalgia   . GERD (gastroesophageal reflux disease)   . Headache(784.0)    "2-3 times/week" (08/26/2015)  . Heart murmur   . Hyperlipidemia    statin intolerant, to be followed by lipid clinic  . Hypertension   . Hypothyroidism   . Increased urinary protein excretion   . Myocardial infarction [facility]) 12/2013   "[place]"  . Neuromuscular disorder ([place])   . PONV (postoperative nausea and vomiting)   . Sleep apnea    NO STUDY DONE .[name].   . Thyroid nodule   . Tobacco abuse   . Type II diabetes mellitus ([place])     Past Surgical History:  Procedure Laterality Date  . ABDOMINAL HYSTERECTOMY  2000   "fibroids; took 1 ovary"  . ANTERIOR CERVICAL DECOMP/DISCECTOMY FUSION  08/23/2011   Procedure: ANTERIOR CERVICAL DECOMPRESSION/DISCECTOMY FUSION 2 LEVELS;  Surgeon: Ophelia Charter, MD;  Location: [place] [facility];  Service: Neurosurgery;  Laterality: N/A;  Anterior Cervical Five-Six/Six-Seven Decompression with Fusion with Interbody Prothesis, Plating, and Bonegraft  . BACK SURGERY    . BOWEL RESECTION  2007   . CARDIAC CATHETERIZATION N/A 08/26/2015   Procedure: Coronary Stent Intervention;  Surgeon: Peter M Martinique, MD;  Location: [place] [facility];  Service: Cardiovascular;  Laterality: N/A;  OM3 and circ  . CARDIAC CATHETERIZATION  2002; 2005   patent stents  . CARDIAC CATHETERIZATION  12/2013   CTO of the  RCA ; moderate disease noted in the third OM and mild disease in the LAD; treated medically  . COLONOSCOPY W/ POLYPECTOMY    . CORONARY ANGIOPLASTY WITH STENT PLACEMENT  2002   s/p BMS x3 RCA   . MULTIPLE TOOTH EXTRACTIONS  2000  . TONSILLECTOMY    . TUBAL LIGATION  1981    Allergies  Allergen Reactions  . Candesartan Cilexetil Shortness Of Breath  . Clonidine Derivatives Shortness Of Breath and  Swelling  . Lisinopril Shortness Of Breath and Swelling  . Losartan Palpitations  . Penicillins Anaphylaxis  . Plavix [Clopidogrel Bisulfate] Itching  . Quinapril Hcl Shortness Of Breath and Swelling  . Repaglinide Palpitations  . Toprol Xl [Metoprolol Tartrate] Shortness Of Breath and Swelling  . Trovan [Alatrofloxacin] Shortness Of Breath and Swelling  . Atorvastatin Other (See Comments)    myalgias  . Clarithromycin Nausea And Vomiting  . Ezetimibe Other (See Comments)    GI bleeding  . Hydromorphone Hcl Nausea And Vomiting  . Metoclopramide Hcl Other (See Comments)    Reaction unknown  . Morphine And Related Nausea And Vomiting  . Oxycodone-Acetaminophen Other (See Comments)    Body feels like bee stings  . Paroxetine Hcl Other (See Comments)    Freezing, burning from inside out.  . Pioglitazone Swelling and Other (See Comments)  . Valsartan Palpitations  . Vancomycin Other (See Comments)    Reaction unknown    Prior to Admission medications   Medication Sig Start Date End Date Taking? Authorizing Provider  acetaminophen (TYLENOL) 500 MG tablet Take 1,000 mg by mouth every 6 (six) hours as needed (pain).    Yes [provider]  albuterol (PROVENTIL HFA;VENTOLIN HFA) 108 (90 BASE) MCG/ACT inhaler Inhale 2 puffs into the lungs every 6 (six) hours as needed for wheezing or shortness of breath.   Yes [provider]  aspirin 81 MG tablet Take 81 mg by mouth daily.   Yes [provider]  bisacodyl (DULCOLAX) 5 MG EC tablet Take 5 mg by mouth daily as needed for mild constipation.    Yes [provider]  bisoprolol-hydrochlorothiazide (ZIAC) 5-6.25 MG tablet Take 1 tablet by mouth 2 (two) times daily. Patient taking differently: Take 2 tablets by mouth 2 (two) times daily.  08/27/15 11/17/19 Yes Kilroy, Luke K, PA-C  fexofenadine (ALLEGRA) 60 MG tablet Take 60 mg by mouth 2 (two) times daily as needed for allergies or rhinitis.   Yes [provider]  fluticasone (FLONASE) 50 MCG/ACT nasal spray Place 2 sprays into the nose daily as needed for allergies.    Yes [provider]  furosemide (LASIX) 40 MG tablet Take 40 mg by mouth daily.    Yes [provider]  LANTUS 100 UNIT/ML injection Inject 105 Units into the skin at bedtime.  01/18/15  Yes [provider]  magnesium oxide (MAG-OX) 400 MG tablet Take 200 mg by mouth daily.    Yes [provider]  metFORMIN (GLUCOPHAGE-XR) 500 MG 24 hr tablet Take 2 tablets (1,000 mg total) by mouth 2 (two) times daily. 08/29/15  Yes Kilroy, Luke K, PA-C  Neomy-Bacit-Polymyx-Pramoxine (NEOSPORIN + PAIN RELIEF MAX ST) 1 % OINT Apply 1 application topically daily as needed (wound care).   Yes [provider]  nitroGLYCERIN (NITROSTAT) 0.4 MG SL tablet DISSOLVE 1 TABLET UNDER THE TONGUE EVERY 5 MINUTES UP TO 3 DOSES Patient taking differently: Place 0.4 mg under the tongue every 5 (five) minutes as needed for chest pain.  02/24/19  Yes Marinus Maw, MD  NOVOLOG 100 UNIT/ML injection Inject 45-60 Units into the skin See admin instructions. INJECT 45-55 UNITS INTO THE SKIN WITH BREAKFAST, INJECT 55-60 UNITS INTO THE SKIN AT LUNCH TIME, INJECT 60 UNITS INTO THE SKIN WITH SUPPER. ANY TIME YOUR BG ARE GREATER THAN 150, INJECT INTO THE SKIN AS DIRECTED PER SLIDING SCALE 01/18/15  Yes [provider]  polyethylene glycol (MIRALAX / GLYCOLAX) packet Take 17 g by mouth daily as needed for moderate constipation.    Yes [provider]  potassium chloride SA (K-DUR,KLOR-CON) 20 MEQ tablet Take 20 mEq by mouth daily.   Yes [provider]  RABEprazole (ACIPHEX) 20 MG tablet Take 20 mg by mouth daily. 10/05/14 11/17/19 Yes [provider]  Simethicone (GAS-X PO) Take 1-2 tablets by mouth 2 (two) times daily as needed (gas).   Yes [provider]  SYNTHROID 150 MCG tablet Take 150-225 mcg by mouth See admin instructions. Take 150  mcg daily except on Sundays take 225 mcg 01/18/15  Yes [provider]  Vitamin D, Ergocalciferol, (DRISDOL) 50000 UNITS CAPS Take 50,000 Units by mouth every Sunday.    Yes [provider]    Social History   Socioeconomic History  . Marital status: Married    Spouse name: Not on file  . Number of children: Not on file  . Years of education: Not on file  . Highest education level: Not on file  Occupational History  . Not on file  Tobacco Use  . Smoking status: Current Every Day Smoker    Packs/day: 1.00    Years: 43.00    Pack years: 43.00    Types: Cigarettes  . Smokeless tobacco: Never Used  Substance and Sexual Activity  . Alcohol use: No    Alcohol/week: 0.0 standard drinks  . Drug use: No  . Sexual activity: Not Currently  Other Topics Concern  . Not on file  Social History Narrative  . Not on file   Social Determinants of Health   Financial Resource Strain:   . Difficulty of Paying Living Expenses:   Food Insecurity:   . Worried About Programme researcher, broadcasting/film/video in the Last Year:   . Barista in the Last Year:   Transportation Needs:   . Freight forwarder (Medical):   [name] Lack of Transportation (Non-Medical):   Physical Activity:   . Days of Exercise per Week:   . Minutes of Exercise per Session:   Stress:   . Feeling of Stress :   Social Connections:   . Frequency of Communication with Friends and Family:   . Frequency of Social Gatherings with Friends and Family:   . Attends Religious Services:   . Active Member of Clubs or Organizations:   . Attends Banker Meetings:   [name] Marital Status:   Intimate Partner Violence:   . Fear of Current or Ex-Partner:   . Emotionally Abused:   [name] Physically Abused:   . Sexually Abused:      Family History  Problem Relation Age of Onset  . Anesthesia problems Mother   . Anesthesia problems Sister   . Neuropathy Father   . Heart attack Father        Cause of death  . Neuropathy  Sister   . Diabetes Sister   . Heart Problems Sister   . Diabetes Maternal Grandfather   . Lupus Sister     ROS: [x]  Positive   [ ]   Negative   [ ]  All sytems reviewed and are negative  Cardiovascular: []  chest pain/pressure []  palpitations []  SOB lying flat []  DOE []  pain in legs while walking []  pain in legs at rest []  pain in legs at night []  non-healing ulcers []  hx of DVT []  swelling in legs  Pulmonary: []  productive cough []  asthma/wheezing []  home O2  Neurologic: []  weakness in []  arms []  legs []  numbness in []  arms []  legs []  hx of CVA []  mini stroke [] difficulty speaking or slurred speech []  temporary loss of vision in one eye []  dizziness  Hematologic: []  hx of cancer []  bleeding problems []  problems with blood clotting easily  Endocrine:   []  diabetes []  thyroid disease  GI []  vomiting blood []  blood in stool  GU: []  CKD/renal failure []  HD--[]  M/W/F or []  T/T/S []  burning with urination []  blood in urine  Psychiatric: []  anxiety []  depression  Musculoskeletal: []  arthritis []  joint pain  Integumentary: []  rashes []  ulcers  Constitutional: []  fever []  chills   Physical Examination  Vitals:   11/25/19 1110 11/25/19 1115  BP: (!) 163/66 (!) 159/53  Pulse: (!) 52 (!) 50  Resp: 16 16  Temp:    SpO2: 94% 94%   Body mass index is 36.52 kg/m.  General:  WDWN in NAD Gait: Not observed HENT: WNL, normocephalic Pulmonary: normal non-labored breathing, without Rales, rhonchi,  wheezing Cardiac: regular, without  Murmurs, rubs or gallops Abdomen: soft, NT/ND, no masses Vascular Exam/Pulses: Right femoral pulse weakly palpable Left femoral pulse with dressing after percutaneous access Right DP PT monophasic signals, no tissue loss Left DP more brisk no tissue loss Extremities: without ischemic changes, without Gangrene , without cellulitis; without open wounds;  Musculoskeletal: no muscle wasting or atrophy  Neurologic: A&O X 3;  Appropriate Affect ; SENSATION: normal; MOTOR FUNCTION:  moving all extremities equally. Speech is fluent/normal   CBC    Component Value Date/Time   WBC 9.0 04/08/2019 1123   WBC 9.7 04/26/2016 1001   RBC 4.46 04/08/2019 1123   RBC 4.66 04/26/2016 1001   HGB 15.2 04/08/2019 1123   HCT 42.8 04/08/2019 1123   PLT 202 04/08/2019 1123   MCV 96 04/08/2019 1123   MCH 34.1 (H) 04/08/2019 1123   MCH 34.3 (H) 04/26/2016 1001   MCHC 35.5 04/08/2019 1123   MCHC 35.6 04/26/2016 1001   RDW 12.7 04/08/2019 1123   LYMPHSABS 2.7 03/31/2018 1004   EOSABS 0.2 03/31/2018 1004   BASOSABS 0.1 03/31/2018 1004    BMET    Component Value Date/Time   NA 135 04/08/2019 1123   K 4.3 04/08/2019 1123   CL 95 (L) 04/08/2019 1123   CO2 24 04/08/2019 1123   GLUCOSE 170 (H) 04/08/2019 1123   GLUCOSE 133 (H) 04/26/2016 1001   GLUCOSE 202 (H) 04/08/2006 1520   BUN 10 04/08/2019 1123   CREATININE 0.86 04/08/2019 1123   CREATININE 0.73 04/26/2016 1001   CALCIUM 9.4 04/08/2019 1123   GFRNONAA 73 04/08/2019 1123   GFRAA 84 04/08/2019 1123    COAGS: Lab Results  Component Value Date   INR 0.99 08/22/2015     Non-Invasive Vascular Imaging:     I reviewed her arteriogram pictures with Dr. Kirke Corin - moderate bilateral iliac disease but no significant gradient on pullback pressures.  The right lower extremity showed severely stenosed right common femoral artery with a short distal SFA occlusion and three-vessel runoff.  The left lower extremity has severe common  femoral stenosis/disease with patent three-vessel runoff.   ASSESSMENT/PLAN: This is a 64 y.o. female with multiple medical problems as noted that presents with bilateral lower extremity short distance claudication and severe bilateral common femoral disease.  Given her right leg is more symptomatic, I recommended right femoral endarterectomy followed by staged procedure in the left groin after her right groin heals.  Surgery was discussed in  detail as well as risks and benefits.   I offered to schedule surgery as early as next week but she states she wants to have this done in middle of July after her colonoscopy.  I gave her my contact information and the number to our office.  I will forward message to our office to get her scheduled for July 12 for right femoral endarterectomy.  Cephus Shelling, MD [facility] Office: [phone]

## 2019-11-25 NOTE — Interval H&P Note (Signed)
History and Physical Interval Note:  11/25/2019 9:51 AM  Courtney Barker  has presented today for surgery, with the diagnosis of pad.  The various methods of treatment have been discussed with the patient and family. After consideration of risks, benefits and other options for treatment, the patient has consented to  Procedure(s): ABDOMINAL AORTOGRAM W/LOWER EXTREMITY (N/A) as a surgical intervention.  The patient's history has been reviewed, patient examined, no change in status, stable for surgery.  I have reviewed the patient's chart and labs.  Questions were answered to the patient's satisfaction.     Lorine Bears

## 2019-11-25 NOTE — Progress Notes (Signed)
Site area: left groin fa sheath Site Prior to Removal:  Level 0 Pressure Applied For: 20 minutes Manual:   yes Patient Status During Pull:  stable Post Pull Site:  Level  0 Post Pull Instructions Given:  yes Post Pull Pulses Present: left dp dopplered Dressing Applied:  Gauze and tegaderm Bedrest begins @ 1120 Comments: dr. Chestine Spore talking w/patient regarding surgery

## 2019-11-25 NOTE — Discharge Instructions (Signed)
Angiogram, Care After This sheet gives you information about how to care for yourself after your procedure. Your health care provider may also give you more specific instructions. If you have problems or questions, contact your health care provider. What can I expect after the procedure? After the procedure, it is common to have bruising and tenderness at the catheter insertion area. Follow these instructions at home: Insertion site care  Follow instructions from your health care provider about how to take care of your insertion site. Make sure you: ? Wash your hands with soap and water before you change your bandage (dressing). If soap and water are not available, use hand sanitizer. ? Change your dressing as told by your health care provider. ? Leave stitches (sutures), skin glue, or adhesive strips in place. These skin closures may need to stay in place for 2 weeks or longer. If adhesive strip edges start to loosen and curl up, you may trim the loose edges. Do not remove adhesive strips completely unless your health care provider tells you to do that.  Do not take baths, swim, or use a hot tub until your health care provider approves.  You may shower 24-48 hours after the procedure or as told by your health care provider. ? Gently wash the site with plain soap and water. ? Pat the area dry with a clean towel. ? Do not rub the site. This may cause bleeding.  Do not apply powder or lotion to the site. Keep the site clean and dry.  Check your insertion site every day for signs of infection. Check for: ? Redness, swelling, or pain. ? Fluid or blood. ? Warmth. ? Pus or a bad smell. Activity  Rest as told by your health care provider, usually for 1-2 days.  Do not lift anything that is heavier than 10 lbs. (4.5 kg) or as told by your health care provider.  Do not drive for 24 hours if you were given a medicine to help you relax (sedative).  Do not drive or use heavy machinery while  taking prescription pain medicine. General instructions   Return to your normal activities as told by your health care provider, usually in about a week. Ask your health care provider what activities are safe for you.  If the catheter site starts bleeding, lie flat and put pressure on the site. If the bleeding does not stop, get help right away. This is a medical emergency.  Drink enough fluid to keep your urine clear or pale yellow. This helps flush the contrast dye from your body.  Take over-the-counter and prescription medicines only as told by your health care provider.  Keep all follow-up visits as told by your health care provider. This is important. Contact a health care provider if:  You have a fever or chills.  You have redness, swelling, or pain around your insertion site.  You have fluid or blood coming from your insertion site.  The insertion site feels warm to the touch.  You have pus or a bad smell coming from your insertion site.  You have bruising around the insertion site.  You notice blood collecting in the tissue around the catheter site (hematoma). The hematoma may be painful to the touch. Get help right away if:  You have severe pain at the catheter insertion area.  The catheter insertion area swells very fast.  The catheter insertion area is bleeding, and the bleeding does not stop when you hold steady pressure on the area.    The area near or just beyond the catheter insertion site becomes pale, cool, tingly, or numb. These symptoms may represent a serious problem that is an emergency. Do not wait to see if the symptoms will go away. Get medical help right away. Call your local emergency services (911 in the [place] Do not drive yourself to the hospital. Summary  After the procedure, it is common to have bruising and tenderness at the catheter insertion area.  After the procedure, it is important to rest and drink plenty of fluids.  Do not take baths,  swim, or use a hot tub until your health care provider says it is okay to do so. You may shower 24-48 hours after the procedure or as told by your health care provider.  If the catheter site starts bleeding, lie flat and put pressure on the site. If the bleeding does not stop, get help right away. This is a medical emergency. This information is not intended to replace advice given to you by your health care provider. Make sure you discuss any questions you have with your health care provider. Document Revised: 05/17/2017 Document Reviewed: 05/09/2016 Elsevier Patient Education  2020 [employer]  

## 2019-11-26 ENCOUNTER — Encounter (HOSPITAL_COMMUNITY): Payer: Self-pay | Admitting: Cardiovascular Disease

## 2019-12-03 ENCOUNTER — Other Ambulatory Visit: Payer: Self-pay

## 2019-12-15 ENCOUNTER — Ambulatory Visit: Payer: BC Managed Care – PPO | Admitting: Cardiovascular Disease

## 2019-12-22 ENCOUNTER — Telehealth: Payer: Self-pay

## 2019-12-22 ENCOUNTER — Encounter: Payer: Self-pay | Admitting: Cardiovascular Disease

## 2019-12-22 ENCOUNTER — Other Ambulatory Visit: Payer: Self-pay

## 2019-12-22 ENCOUNTER — Ambulatory Visit: Payer: BLUE CROSS/BLUE SHIELD | Admitting: Cardiovascular Disease

## 2019-12-22 VITALS — BP 140/80 | HR 56 | Ht 60.0 in | Wt 198.0 lb

## 2019-12-22 DIAGNOSIS — E785 Hyperlipidemia, unspecified: Secondary | ICD-10-CM

## 2019-12-22 DIAGNOSIS — I739 Peripheral vascular disease, unspecified: Secondary | ICD-10-CM

## 2019-12-22 DIAGNOSIS — I2510 Atherosclerotic heart disease of native coronary artery without angina pectoris: Secondary | ICD-10-CM

## 2019-12-22 DIAGNOSIS — I10 Essential (primary) hypertension: Secondary | ICD-10-CM

## 2019-12-22 NOTE — Progress Notes (Signed)
Cardiology Office Note   Date:  12/22/2019   ID:  MARILYN NIHISER, DOB 05/18/1956, MRN [id number]  PCP:  Martyn Malay, MD  Cardiologist: Dr. Swaziland  No chief complaint on file.     History of Present Illness: Courtney Barker is a 64 y.o. female who is here today for follow-up visit regarding peripheral arterial disease.   She has known history of coronary artery disease with chronically occluded RCA, essential hypertension, hyperlipidemia, type 2 diabetes, COPD, tobacco use and anxiety disorder. The patient is being followed for peripheral arterial disease with bilateral leg claudication.  In addition, she has significant lower back pain due to suspected spinal stenosis. Most recent vascular studies showed an ABI of 0.73 on the right and 0.86 on the left.  Claudication is worse on the right than the left. Angiography last month showed moderate bilateral iliac artery disease not significant by gradients, severe calcified stenosis affecting the right common femoral artery, short occlusion of the right distal SFA and three-vessel runoff below the knee.  On the left, there was severe calcified subocclusive disease affecting the left common femoral artery with mild nonobstructive SFA and popliteal artery disease and three-vessel runoff below the knee.  I recommended bilateral common femoral artery endarterectomy and the patient was seen by Dr. Chestine Spore.  She is scheduled for the surgery next week.  She denies any chest pain or worsening dyspnea.  Her leg symptoms are still the same.  Past Medical History:  Diagnosis Date  . Anxiety   . Arthritis    "head to toe; neck, hands, arms, knees, feet, ankles" (08/26/2015)  . Asthma   . Cervical spondylosis with myelopathy    s/p C5-6 and C6-7 Anterior cervical discectomy/decompression; C5-6 and C6-7 interbody arthrodesis with local morcellized autograft bone and Actifuse bone graft extender; insertion of interbody prosthesis at C5-6 and C6-7 (Zimmer peek  interbody prosthesis); anterior cervical plating from C5-6 and C6-7 with globus titanium plate 12/24/22  . Chronic lower back pain   . COPD (chronic obstructive pulmonary disease) (HCC)   . Coronary artery disease    s/p BMS x3 RCA '02; repeat cath in '02 and '05 were without significant ISR and normal LV function; pt reported unremarkable nuclear myoview 2011/12  . Depression   . Fibromyalgia   . GERD (gastroesophageal reflux disease)   . Headache(784.0)    "2-3 times/week" (08/26/2015)  . Heart murmur   . Hyperlipidemia    statin intolerant, to be followed by lipid clinic  . Hypertension   . Hypothyroidism   . Increased urinary protein excretion   . Myocardial infarction [facility]) 12/2013   "[place]"  . Neuromuscular disorder (HCC)   . PONV (postoperative nausea and vomiting)   . Sleep apnea    NO STUDY DONE .[name].   . Thyroid nodule   . Tobacco abuse   . Type II diabetes mellitus (HCC)     Past Surgical History:  Procedure Laterality Date  . ABDOMINAL AORTOGRAM W/LOWER EXTREMITY N/A 11/25/2019   Procedure: ABDOMINAL AORTOGRAM W/LOWER EXTREMITY;  Surgeon: Iran Ouch, MD;  Location: [facility] INVASIVE CV LAB;  Service: Cardiovascular;  Laterality: N/A;  . ABDOMINAL HYSTERECTOMY  2000   "fibroids; took 1 ovary"  . ANTERIOR CERVICAL DECOMP/DISCECTOMY FUSION  08/23/2011   Procedure: ANTERIOR CERVICAL DECOMPRESSION/DISCECTOMY FUSION 2 LEVELS;  Surgeon: Cristi Loron, MD;  Location: [facility] NEURO ORS;  Service: Neurosurgery;  Laterality: N/A;  Anterior Cervical Five-Six/Six-Seven Decompression with Fusion with Interbody Prothesis, Plating, and Bonegraft  .  BACK SURGERY    . BOWEL RESECTION  2007   . CARDIAC CATHETERIZATION N/A 08/26/2015   Procedure: Coronary Stent Intervention;  Surgeon: Peter M Swaziland, MD;  Location: [facility] INVASIVE CV LAB;  Service: Cardiovascular;  Laterality: N/A;  OM3 and circ  . CARDIAC CATHETERIZATION  2002; 2005   patent stents  . CARDIAC CATHETERIZATION  12/2013   CTO of  the RCA ; moderate disease noted in the third OM and mild disease in the LAD; treated medically  . COLONOSCOPY W/ POLYPECTOMY    . CORONARY ANGIOPLASTY WITH STENT PLACEMENT  2002   s/p BMS x3 RCA   . MULTIPLE TOOTH EXTRACTIONS  2000  . TONSILLECTOMY    . TUBAL LIGATION  1981     Current Outpatient Medications  Medication Sig Dispense Refill  . acetaminophen (TYLENOL) 500 MG tablet Take 1,000 mg by mouth every 6 (six) hours as needed (pain).     [name] albuterol (PROVENTIL HFA;VENTOLIN HFA) 108 (90 BASE) MCG/ACT inhaler Inhale 2 puffs into the lungs every 6 (six) hours as needed for wheezing or shortness of breath.    [name] aspirin 81 MG tablet Take 81 mg by mouth daily.    . bisacodyl (DULCOLAX) 5 MG EC tablet Take 5 mg by mouth daily as needed for mild constipation.     . fexofenadine (ALLEGRA) 60 MG tablet Take 60 mg by mouth 2 (two) times daily as needed for allergies or rhinitis.    . fluticasone (FLONASE) 50 MCG/ACT nasal spray Place 2 sprays into the nose daily as needed for allergies.     . furosemide (LASIX) 40 MG tablet Take 40 mg by mouth daily.     . indomethacin (INDOCIN) 25 MG capsule Take 25 mg by mouth daily as needed for mild pain.    [name] LANTUS 100 UNIT/ML injection Inject 155 Units into the skin at bedtime.   5  . magnesium oxide (MAG-OX) 400 MG tablet Take 200 mg by mouth daily.     . metFORMIN (GLUCOPHAGE-XR) 500 MG 24 hr tablet Take 2 tablets (1,000 mg total) by mouth 2 (two) times daily. (Patient taking differently: Take 500 mg by mouth once. 1 with dinner)    . Neomy-Bacit-Polymyx-Pramoxine (NEOSPORIN + PAIN RELIEF MAX ST) 1 % OINT Apply 1 application topically daily as needed (wound care).    . nitroGLYCERIN (NITROSTAT) 0.4 MG SL tablet DISSOLVE 1 TABLET UNDER THE TONGUE EVERY 5 MINUTES UP TO 3 DOSES (Patient taking differently: Place 0.4 mg under the tongue every 5 (five) minutes as needed for chest pain. ) 25 tablet 6  . NOVOLOG 100 UNIT/ML injection Inject 50-60 Units into  the skin See admin instructions. INJECT 50-55 UNITS INTO THE SKIN WITH BREAKFAST, INJECT 55-60 UNITS INTO THE SKIN AT LUNCH TIME, INJECT 60 UNITS INTO THE SKIN WITH SUPPER. ANY TIME YOUR BG ARE GREATER THAN 150, INJECT INTO THE SKIN AS DIRECTED PER SLIDING SCALE  5  . polyethylene glycol (MIRALAX / GLYCOLAX) packet Take 17 g by mouth daily as needed for moderate constipation.     . potassium chloride SA (K-DUR,KLOR-CON) 20 MEQ tablet Take 20 mEq by mouth daily.    . Simethicone (GAS-X PO) Take 1-2 tablets by mouth 2 (two) times daily as needed (gas).    . SYNTHROID 150 MCG tablet Take 150-225 mcg by mouth See admin instructions. Take 150 mcg daily except on Sundays take 225 mcg  5  . Vitamin D, Ergocalciferol, (DRISDOL) 50000 UNITS CAPS Take 50,000 Units  by mouth every Sunday.     . bisoprolol-hydrochlorothiazide (ZIAC) 5-6.25 MG tablet Take 1 tablet by mouth 2 (two) times daily. (Patient taking differently: Take 2 tablets by mouth 2 (two) times daily. )    . RABEprazole (ACIPHEX) 20 MG tablet Take 20 mg by mouth daily.     No current facility-administered medications for this visit.    Allergies:   Candesartan cilexetil, Clonidine derivatives, Lisinopril, Losartan, Penicillins, Plavix [clopidogrel bisulfate], Quinapril hcl, Repaglinide, Toprol xl [metoprolol tartrate], Trovan [alatrofloxacin], Atorvastatin, Clarithromycin, Ezetimibe, Hydromorphone hcl, Metoclopramide hcl, Morphine and related, Oxycodone-acetaminophen, Paroxetine hcl, Pioglitazone, Valsartan, and Vancomycin    Social History:  The patient  reports that she has been smoking cigarettes. She has a 43.00 pack-year smoking history. She has never used smokeless tobacco. She reports that she does not drink alcohol and does not use drugs.   Family History:  The patient's family history includes Anesthesia problems in her mother and sister; Diabetes in her maternal grandfather and sister; Heart Problems in her sister; Heart attack in her  father; Lupus in her sister; Neuropathy in her father and sister.    ROS:  Please see the history of present illness.   Otherwise, review of systems are positive for none.   All other systems are reviewed and negative.    PHYSICAL EXAM: VS:  BP 140/80   Pulse (!) 56   Ht 5' (1.524 m)   Wt 198 lb (89.8 kg)   SpO2 98%   BMI 38.67 kg/m  , BMI Body mass index is 38.67 kg/m. GEN: Well nourished, well developed, in no acute distress  HEENT: normal  Neck: no JVD, carotid bruits, or masses Cardiac: RRR; no  rubs, or gallops,no edema . 2/ 6 systolic murmur at the aortic area Respiratory:  clear to auscultation bilaterally, normal work of breathing GI: soft, nontender, nondistended, + BS MS: no deformity or atrophy  Skin: warm and dry, no rash Neuro:  Strength and sensation are intact Psych: euthymic mood, full affect Vascular: Femoral pulses +1 bilaterally.  Distal pulses are not palpable.   EKG:  EKG is not ordered today.    Recent Labs: 04/08/2019: ALT 42; BUN 10; Creatinine, Ser 0.86; Hemoglobin 15.2; Platelets 202; Potassium 4.3; Sodium 135    Lipid Panel    Component Value Date/Time   CHOL 277 (H) 04/08/2019 1123   TRIG 321 (H) 04/08/2019 1123   TRIG 242 (HH) 04/08/2006 1520   HDL 41 04/08/2019 1123   CHOLHDL 6.8 (H) 04/08/2019 1123   CHOLHDL 7.4 CALC 09/27/2006 1500   VLDL 47 (H) 09/27/2006 1500   LDLCALC 174 (H) 04/08/2019 1123   LDLDIRECT 221.2 09/27/2006 1500      Wt Readings from Last 3 Encounters:  12/22/19 198 lb (89.8 kg)  11/25/19 187 lb (84.8 kg)  11/10/19 190 lb (86.2 kg)       PAD Screen 05/27/2018  Previous PAD dx? No  Previous surgical procedure? No  Pain with walking? Yes  Subsides with rest? Yes  Feet/toe relief with dangling? No  Painful, non-healing ulcers? No  Extremities discolored? No      ASSESSMENT AND PLAN:  1.  Peripheral arterial disease with intermittent claudication: The patient was recently found to have significant  bilateral common femoral artery disease for which I have recommended femoral artery endarterectomy.  In addition, she has short occlusion of the right SFA with good collaterals and this can likely be left to be treated medically.  I again reviewed the angiogram with  the patient and explained the surgery. The patient does not require ischemic cardiac testing before endarterectomy.  2.  Possible spinal stenosis: I do suspect that her lower back pain with radiation to her legs is likely related to spinal stenosis given worsening with standing and improvement with bending.  Imaging of the LS spine should be considered.  The patient is going to discuss with her primary care physician.  3.  Hyperlipidemia: Intolerance to statins.  I recommend treatment with a PCSK9 inhibitor.  The patient has not made up her mind if she wants to take a PCSK9 inhibitor.   4.  Tobacco use: I discussed the importance of smoking cessation and she is interested in a cessation at some point in the near future.  She would like to attend the class if that becomes available.  5.  Coronary artery disease involving native coronary arteries: No significant angina.  6.  Essential hypertension, blood pressure is well controlled on current medication.   Disposition:   FU with me in 3 months  Signed,  Lorine Bears, MD  12/22/2019 1:18 PM    [facility]

## 2019-12-22 NOTE — Patient Instructions (Signed)
Medication Instructions:  No changes *If you need a refill on your cardiac medications before your next appointment, please call your pharmacy*   Lab Work: Your provider would like for you to have the following labs today: A1C and B-12  If you have labs (blood work) drawn today and your tests are completely normal, you will receive your results only by: [name] MyChart Message (if you have MyChart) OR . A paper copy in the mail If you have any lab test that is abnormal or we need to change your treatment, we will call you to review the results.   Testing/Procedures: No change   Follow-Up: At [facility], you and your health needs are our priority.  As part of our continuing mission to provide you with exceptional heart care, we have created designated Provider Care Teams.  These Care Teams include your primary Cardiologist (physician) and Advanced Practice Providers (APPs -  Physician Assistants and Nurse Practitioners) who all work together to provide you with the care you need, when you need it.  We recommend signing up for the patient portal called "MyChart".  Sign up information is provided on this After Visit Summary.  MyChart is used to connect with patients for Virtual Visits (Telemedicine).  Patients are able to view lab/test results, encounter notes, upcoming appointments, etc.  Non-urgent messages can be sent to your provider as well.   To learn more about what you can do with MyChart, go to [email or web address].    Your next appointment:   3 month(s)  The format for your next appointment:   In Person  Provider:   Lorine Bears, MD

## 2019-12-22 NOTE — Telephone Encounter (Signed)
Pt called with questions regarding upcoming surgery. Her questions were answered. She will call us back if anything else comes up between now and next week.

## 2019-12-23 LAB — HEMOGLOBIN A1C
Est. average glucose Bld gHb Est-mCnc: 146 mg/dL
Hgb A1c MFr Bld: 6.7 % — ABNORMAL HIGH (ref 4.8–5.6)

## 2019-12-23 LAB — VITAMIN B12: Vitamin B-12: 338 pg/mL (ref 232–1245)

## 2019-12-24 ENCOUNTER — Inpatient Hospital Stay (HOSPITAL_COMMUNITY)
Admission: RE | Admit: 2019-12-24 | Discharge: 2019-12-24 | Disposition: A | Discharge status: Home / Self Care | Payer: BC Managed Care – PPO | Source: Ambulatory Visit

## 2019-12-24 NOTE — Progress Notes (Signed)
[id number] - [place], [place] - [address] AT [facility] OF [facility] & [address] [facility] [place] [address] Phone: [phone] Fax: [phone]  [facility], [place] - [address] [place] [address] Phone: [phone] Fax: [phone]      Your procedure is scheduled on 12/28/19.  Report to [facility] Main Entrance "A" at 8:10 A.M., and check in at the Admitting office.  Call this number if you have problems the morning of surgery:  [phone]  Call [phone] if you have any questions prior to your surgery date Monday-Friday 8am-4pm    Remember:  Do not eat or drink after midnight the night before your surgery    Take these medicines the morning of surgery with A SIP OF WATER: acetaminophen (TYLENOL) - as needed albuterol (PROVENTIL HFA;VENTOLIN HFA) - as needed fexofenadine (ALLEGRA) - as needed fluticasone (FLONASE) - as needed nitroGLYCERIN (NITROSTAT) - as needed  RABEprazole (ACIPHEX)  Simethicone (GAS-X PO) - as needed   WHAT DO I DO ABOUT MY DIABETES MEDICATION?   [name] Do not take oral diabetes medicines (pills) the morning of surgery.  . THE NIGHT BEFORE SURGERY, take ___________ units of ___Lantus__insulin.      . The day of surgery, do not take other diabetes injectables, including Byetta (exenatide), Bydureon (exenatide ER), Victoza (liraglutide), or Trulicity (dulaglutide).  . If your CBG is greater than 220 mg/dL, you may take  of your sliding scale (correction) dose of insulin.   HOW TO MANAGE YOUR DIABETES BEFORE AND AFTER SURGERY  Why is it important to control my blood sugar before and after surgery? . Improving blood sugar levels before and after surgery helps healing and can limit problems. . A way of improving blood sugar control is eating a healthy diet by: o  Eating less sugar and carbohydrates o  Increasing activity/exercise o  Talking with your doctor about  reaching your blood sugar goals . High blood sugars (greater than 180 mg/dL) can raise your risk of infections and slow your recovery, so you will need to focus on controlling your diabetes during the weeks before surgery. . Make sure that the doctor who takes care of your diabetes knows about your planned surgery including the date and location.  How do I manage my blood sugar before surgery? . Check your blood sugar at least 4 times a day, starting 2 days before surgery, to make sure that the level is not too high or low. . Check your blood sugar the morning of your surgery when you wake up and every 2 hours until you get to the Short Stay unit. o If your blood sugar is less than 70 mg/dL, you will need to treat for low blood sugar: - Do not take insulin. - Treat a low blood sugar (less than 70 mg/dL) with  cup of clear juice (cranberry or apple), 4 glucose tablets, OR glucose gel. - Recheck blood sugar in 15 minutes after treatment (to make sure it is greater than 70 mg/dL). If your blood sugar is not greater than 70 mg/dL on recheck, call [phone] for further instructions. . Report your blood sugar to the short stay nurse when you get to Short Stay.  . If you are admitted to the hospital after surgery: o Your blood sugar will be checked by the staff and you will probably be given insulin after surgery (instead of oral diabetes medicines) to make sure you have good blood  sugar levels. o The goal for blood sugar control after surgery is 80-180 mg/dL.  As of today, STOP taking any Aspirin (unless otherwise instructed by your surgeon) Aleve, Naproxen, Ibuprofen, Motrin, Advil, Goody's, BC's, all herbal medications, fish oil, and all vitamins.                      Do not wear jewelry, make up, or nail polish            Do not wear lotions, powders, perfumes or deodorant.            Do not shave 48 hours prior to surgery.             Do not bring valuables to the hospital.            [facility] is not responsible for any belongings or valuables.  Do NOT Smoke (Tobacco/Vaping) or drink Alcohol 24 hours prior to your procedure If you use a CPAP at night, you may bring all equipment for your overnight stay.   Contacts, glasses, dentures or bridgework may not be worn into surgery.      For patients admitted to the hospital, discharge time will be determined by your treatment team.   Patients discharged the day of surgery will not be allowed to drive home, and someone needs to stay with them for 24 hours.    Special instructions:   [place]- Preparing For Surgery  Before surgery, you can play an important role. Because skin is not sterile, your skin needs to be as free of germs as possible. You can reduce the number of germs on your skin by washing with CHG (chlorahexidine gluconate) Soap before surgery.  CHG is an antiseptic cleaner which kills germs and bonds with the skin to continue killing germs even after washing.    Oral Hygiene is also important to reduce your risk of infection.  Remember - BRUSH YOUR TEETH THE MORNING OF SURGERY WITH YOUR REGULAR TOOTHPASTE  Please do not use if you have an allergy to CHG or antibacterial soaps. If your skin becomes reddened/irritated stop using the CHG.  Do not shave (including legs and underarms) for at least 48 hours prior to first CHG shower. It is OK to shave your face.  Please follow these instructions carefully.   1. Shower the NIGHT BEFORE SURGERY and the MORNING OF SURGERY with CHG Soap.   2. If you chose to wash your hair, wash your hair first as usual with your normal shampoo.  3. After you shampoo, rinse your hair and body thoroughly to remove the shampoo.  4. Use CHG as you would any other liquid soap. You can apply CHG directly to the skin and wash gently with a scrungie or a clean washcloth.   5. Apply the CHG Soap to your body ONLY FROM THE NECK DOWN.  Do not use on open wounds or open sores. Avoid contact with  your eyes, ears, mouth and genitals (private parts). Wash Face and genitals (private parts)  with your normal soap.   6. Wash thoroughly, paying special attention to the area where your surgery will be performed.  7. Thoroughly rinse your body with warm water from the neck down.  8. DO NOT shower/wash with your normal soap after using and rinsing off the CHG Soap.  9. Pat yourself dry with a CLEAN TOWEL.  10. Wear CLEAN PAJAMAS to bed the night before surgery  11. Place CLEAN SHEETS  on your bed the night of your first shower and DO NOT SLEEP WITH PETS.   Day of Surgery: Wear Clean/Comfortable clothing the morning of surgery Do not apply any deodorants/lotions.   Remember to brush your teeth WITH YOUR REGULAR TOOTHPASTE.   Please read over the following fact sheets that you were given.

## 2019-12-26 ENCOUNTER — Other Ambulatory Visit (HOSPITAL_COMMUNITY): Payer: BC Managed Care – PPO

## 2019-12-28 ENCOUNTER — Encounter: Payer: Self-pay | Admitting: *Deleted

## 2020-01-10 NOTE — Progress Notes (Signed)
Your procedure is scheduled on Friday, July 30th.  Report to [facility] Main Entrance "A" at 58:30 A.M., and check in at the Admitting office.  Call this number if you have problems the morning of surgery:  [phone]  Call [phone] if you have any questions prior to your surgery date Monday-Friday 8am-4pm   Remember:  Do not eat or drink after midnight the night before your surgery   Take these medicines the morning of surgery with A SIP OF WATER: RABEprazole (ACIPHEX)  SYNTHROID   If needed:  acetaminophen (TYLENOL),  fexofenadine (ALLEGRA). fluticasone (FLONASE), nitroGLYCERIN (NITROSTAT), Simethicone (GAS-X PO) albuterol (PROVENTIL HFA;VENTOLIN HFA)- bring with you the day of surgery  Follow your surgeon's instructions on when to stop Aspirin.  If no instructions were given by your surgeon then you will need to call the office to get those instructions.    As of today, STOP taking indomethacin (INDOCIN), Aleve, Naproxen, Ibuprofen, Motrin, Advil, Goody's, BC's, all herbal medications, fish oil, and all vitamins.  WHAT DO I DO ABOUT MY DIABETES MEDICATION?  ---The night before surgery--- Take 77 units of LANTUS   . If your CBG is greater than 220 mg/dL, you may take  of your sliding scale (correction) dose of insulin.  HOW TO MANAGE YOUR DIABETES BEFORE AND AFTER SURGERY  Why is it important to control my blood sugar before and after surgery? . Improving blood sugar levels before and after surgery helps healing and can limit problems. . A way of improving blood sugar control is eating a healthy diet by: o  Eating less sugar and carbohydrates o  Increasing activity/exercise o  Talking with your doctor about reaching your blood sugar goals . High blood sugars (greater than 180 mg/dL) can raise your risk of infections and slow your recovery, so you will need to focus on controlling your diabetes during the weeks before surgery. . Make sure that the doctor who takes care  of your diabetes knows about your planned surgery including the date and location.  How do I manage my blood sugar before surgery? . Check your blood sugar at least 4 times a day, starting 2 days before surgery, to make sure that the level is not too high or low. . Check your blood sugar the morning of your surgery when you wake up and every 2 hours until you get to the Short Stay unit. o If your blood sugar is less than 70 mg/dL, you will need to treat for low blood sugar: - Do not take insulin. - Treat a low blood sugar (less than 70 mg/dL) with  cup of clear juice (cranberry or apple), 4 glucose tablets, OR glucose gel. - Recheck blood sugar in 15 minutes after treatment (to make sure it is greater than 70 mg/dL). If your blood sugar is not greater than 70 mg/dL on recheck, call [phone] for further instructions. . Report your blood sugar to the short stay nurse when you get to Short Stay.  . If you are admitted to the hospital after surgery: o Your blood sugar will be checked by the staff and you will probably be given insulin after surgery (instead of oral diabetes medicines) to make sure you have good blood sugar levels. o The goal for blood sugar control after surgery is 80-180 mg/dL.                     Do not wear jewelry, make up, or nail polish  Do not wear lotions, powders, perfumes or deodorant.            Do not shave 48 hours prior to surgery.             Do not bring valuables to the hospital.            [facility] is not responsible for any belongings or valuables.  Do NOT Smoke (Tobacco/Vaping) or drink Alcohol 24 hours prior to your procedure If you use a CPAP at night, you may bring all equipment for your overnight stay.   Contacts, glasses, dentures or bridgework may not be worn into surgery.      For patients admitted to the hospital, discharge time will be determined by your treatment team.   Patients discharged the day of surgery will not be allowed  to drive home, and someone needs to stay with them for 24 hours.    Special instructions:   [place]- Preparing For Surgery  Before surgery, you can play an important role. Because skin is not sterile, your skin needs to be as free of germs as possible. You can reduce the number of germs on your skin by washing with CHG (chlorahexidine gluconate) Soap before surgery.  CHG is an antiseptic cleaner which kills germs and bonds with the skin to continue killing germs even after washing.    Oral Hygiene is also important to reduce your risk of infection.  Remember - BRUSH YOUR TEETH THE MORNING OF SURGERY WITH YOUR REGULAR TOOTHPASTE  Please do not use if you have an allergy to CHG or antibacterial soaps. If your skin becomes reddened/irritated stop using the CHG.  Do not shave (including legs and underarms) for at least 48 hours prior to first CHG shower. It is OK to shave your face.  Please follow these instructions carefully.   1. Shower the NIGHT BEFORE SURGERY and the MORNING OF SURGERY with CHG Soap.   2. If you chose to wash your hair, wash your hair first as usual with your normal shampoo.  3. After you shampoo, rinse your hair and body thoroughly to remove the shampoo.  4. Use CHG as you would any other liquid soap. You can apply CHG directly to the skin and wash gently with a scrungie or a clean washcloth.   5. Apply the CHG Soap to your body ONLY FROM THE NECK DOWN.  Do not use on open wounds or open sores. Avoid contact with your eyes, ears, mouth and genitals (private parts). Wash Face and genitals (private parts)  with your normal soap.   6. Wash thoroughly, paying special attention to the area where your surgery will be performed.  7. Thoroughly rinse your body with warm water from the neck down.  8. DO NOT shower/wash with your normal soap after using and rinsing off the CHG Soap.  9. Pat yourself dry with a CLEAN TOWEL.  10. Wear CLEAN PAJAMAS to bed the night before  surgery  11. Place CLEAN SHEETS on your bed the night of your first shower and DO NOT SLEEP WITH PETS.   Day of Surgery: Wear Clean/Comfortable clothing the morning of surgery Do not apply any deodorants/lotions.   Remember to brush your teeth WITH YOUR REGULAR TOOTHPASTE.   Please read over the following fact sheets that you were given.

## 2020-01-11 ENCOUNTER — Encounter (HOSPITAL_COMMUNITY)
Admission: RE | Admit: 2020-01-11 | Discharge: 2020-01-11 | Disposition: A | Discharge status: Home / Self Care | Payer: BLUE CROSS/BLUE SHIELD | Source: Ambulatory Visit | Attending: Vascular Surgery | Admitting: Vascular Surgery

## 2020-01-11 ENCOUNTER — Other Ambulatory Visit: Payer: Self-pay

## 2020-01-11 ENCOUNTER — Encounter (HOSPITAL_COMMUNITY): Payer: Self-pay

## 2020-01-11 DIAGNOSIS — Z01812 Encounter for preprocedural laboratory examination: Secondary | ICD-10-CM | POA: Diagnosis present

## 2020-01-11 LAB — CBC
HCT: 43.6 % (ref 36.0–46.0)
Hemoglobin: 15.7 g/dL — ABNORMAL HIGH (ref 12.0–15.0)
MCH: 33.3 pg (ref 26.0–34.0)
MCHC: 36 g/dL (ref 30.0–36.0)
MCV: 92.4 fL (ref 80.0–100.0)
Platelets: 203 10*3/uL (ref 150–400)
RBC: 4.72 MIL/uL (ref 3.87–5.11)
RDW: 11.8 % (ref 11.5–15.5)
WBC: 9.6 10*3/uL (ref 4.0–10.5)
nRBC: 0 % (ref 0.0–0.2)

## 2020-01-11 LAB — COMPREHENSIVE METABOLIC PANEL
ALT: 24 U/L (ref 0–44)
AST: 23 U/L (ref 15–41)
Albumin: 3.7 g/dL (ref 3.5–5.0)
Alkaline Phosphatase: 59 U/L (ref 38–126)
Anion gap: 13 (ref 5–15)
BUN: 12 mg/dL (ref 8–23)
CO2: 25 mmol/L (ref 22–32)
Calcium: 9.3 mg/dL (ref 8.9–10.3)
Chloride: 98 mmol/L (ref 98–111)
Creatinine, Ser: 0.86 mg/dL (ref 0.44–1.00)
GFR calc Af Amer: >60 mL/min (ref >60)
GFR calc non Af Amer: >60 mL/min (ref >60)
Glucose, Bld: 218 mg/dL — ABNORMAL HIGH (ref 70–99)
Potassium: 3.9 mmol/L (ref 3.5–5.1)
Sodium: 136 mmol/L (ref 135–145)
Total Bilirubin: 0.9 mg/dL (ref 0.3–1.2)
Total Protein: 7.1 g/dL (ref 6.5–8.1)

## 2020-01-11 LAB — PROTIME-INR
INR: 1 (ref 0.8–1.2)
Prothrombin Time: 12.7 seconds (ref 11.4–15.2)

## 2020-01-11 LAB — SURGICAL PCR SCREEN
MRSA, PCR: NEGATIVE
Staphylococcus aureus: POSITIVE — AB

## 2020-01-11 LAB — URINALYSIS, ROUTINE W REFLEX MICROSCOPIC
Bilirubin Urine: NEGATIVE
Glucose, UA: NEGATIVE mg/dL
Hgb urine dipstick: NEGATIVE
Ketones, ur: NEGATIVE mg/dL
Leukocytes,Ua: NEGATIVE
Nitrite: NEGATIVE
Protein, ur: NEGATIVE mg/dL
Specific Gravity, Urine: 1.012 (ref 1.005–1.030)
pH: 5 (ref 5.0–8.0)

## 2020-01-11 LAB — APTT: aPTT: 27 seconds (ref 24–36)

## 2020-01-11 LAB — TYPE AND SCREEN
ABO/RH(D): A NEG
Antibody Screen: NEGATIVE

## 2020-01-11 LAB — GLUCOSE, CAPILLARY: Glucose-Capillary: 227 mg/dL — ABNORMAL HIGH (ref 70–99)

## 2020-01-11 NOTE — Progress Notes (Signed)
Pt denies that mother had an anesthesia complication.

## 2020-01-11 NOTE — Progress Notes (Signed)
Pt denies any acute cardiopulmonary issues. Pt stated that she is under the care of Dr. [place], Cardiology and Dr. Mariane Baumgarten, PCP [facility]). Pt stated that an echo was performed > 10 years ago. Pt denies labs after 12/22/19. Pt denies having a chest x ray.  Pt reminded to quarantine. Pt verbalized understanding of all pre-op instructions. Pt chart forwarded to PA, Anesthesiology,  for review.

## 2020-01-11 NOTE — Progress Notes (Signed)
Your procedure is scheduled on Friday, January 15, 2020.  Report to [facility] Main Entrance "A" at 5:30 A.M., and check in at the Admitting office.  Call this number if you have problems the morning of surgery:  [phone]  Call [phone] if you have any questions prior to your surgery date Monday-Friday 8am-4pm   Remember:  Do not eat or drink after midnight the night before your surgery.   Take these medicines the morning of surgery with A SIP OF WATER: Aspirin  bisoprolol-hydrochlorothiazide (ZIAC)  RABEprazole (ACIPHEX)  SYNTHROID   If needed:  acetaminophen (TYLENOL),  fexofenadine (ALLEGRA).  fluticasone (FLONASE),  Simethicone (GAS-X PO),  nitroGLYCERIN (NITROSTAT),   albuterol (PROVENTIL HFA;VENTOLIN HFA)- bring with you the day of surgery   As of today, STOP taking indomethacin (INDOCIN), Aleve, Naproxen, Ibuprofen, Motrin, Advil, Goody's, BC's, all herbal medications, fish oil, and all vitamins.  WHAT DO I DO ABOUT MY DIABETES MEDICATION?  Do not take Metformin the morning of surgery.   ---The night before surgery--- Take 77 units of LANTUS   . If your CBG is greater than 220 mg/dL, you may take  of your sliding scale (correction) dose of insulin ( NOVOLOG )  HOW TO MANAGE YOUR DIABETES BEFORE AND AFTER SURGERY  Why is it important to control my blood sugar before and after surgery? . Improving blood sugar levels before and after surgery helps healing and can limit problems. . A way of improving blood sugar control is eating a healthy diet by: o  Eating less sugar and carbohydrates o  Increasing activity/exercise o  Talking with your doctor about reaching your blood sugar goals . High blood sugars (greater than 180 mg/dL) can raise your risk of infections and slow your recovery, so you will need to focus on controlling your diabetes during the weeks before surgery. . Make sure that the doctor who takes care of your diabetes knows about your planned surgery  including the date and location.  How do I manage my blood sugar before surgery? . Check your blood sugar at least 4 times a day, starting 2 days before surgery, to make sure that the level is not too high or low. . Check your blood sugar the morning of your surgery when you wake up and every 2 hours until you get to the Short Stay unit. o If your blood sugar is less than 70 mg/dL, you will need to treat for low blood sugar: - Do not take insulin. - Treat a low blood sugar (less than 70 mg/dL) with  cup of clear juice (cranberry or apple), 4 glucose tablets, OR glucose gel. - Recheck blood sugar in 15 minutes after treatment (to make sure it is greater than 70 mg/dL). If your blood sugar is not greater than 70 mg/dL on recheck, call [phone] for further instructions. . Report your blood sugar to the short stay nurse when you get to Short Stay.  . If you are admitted to the hospital after surgery: o Your blood sugar will be checked by the staff and you will probably be given insulin after surgery (instead of oral diabetes medicines) to make sure you have good blood sugar levels. o The goal for blood sugar control after surgery is 80-180 mg/dL.                     Do not wear jewelry, make up, or nail polish            Do  not wear lotions, powders, perfumes or deodorant.            Do not shave 48 hours prior to surgery.             Do not bring valuables to the hospital.            [facility] is not responsible for any belongings or valuables.  Do NOT Smoke (Tobacco/Vaping) or drink Alcohol 24 hours prior to your procedure If you use a CPAP at night, you may bring all equipment for your overnight stay.   Contacts, glasses, dentures or bridgework may not be worn into surgery.      For patients admitted to the hospital, discharge time will be determined by your treatment team.   Patients discharged the day of surgery will not be allowed to drive home, and someone needs to stay with  them for 24 hours.    Special instructions:   [place]- Preparing For Surgery  Before surgery, you can play an important role. Because skin is not sterile, your skin needs to be as free of germs as possible. You can reduce the number of germs on your skin by washing with CHG (chlorahexidine gluconate) Soap before surgery.  CHG is an antiseptic cleaner which kills germs and bonds with the skin to continue killing germs even after washing.    Oral Hygiene is also important to reduce your risk of infection.  Remember - BRUSH YOUR TEETH THE MORNING OF SURGERY WITH YOUR REGULAR TOOTHPASTE  Please do not use if you have an allergy to CHG or antibacterial soaps. If your skin becomes reddened/irritated stop using the CHG.  Do not shave (including legs and underarms) for at least 48 hours prior to first CHG shower. It is OK to shave your face.  Please follow these instructions carefully.   1. Shower the NIGHT BEFORE SURGERY and the MORNING OF SURGERY with CHG Soap.   2. If you chose to wash your hair, wash your hair first as usual with your normal shampoo.  3. After you shampoo, rinse your hair and body thoroughly to remove the shampoo.  4. Use CHG as you would any other liquid soap. You can apply CHG directly to the skin and wash gently with a scrungie or a clean washcloth.   5. Apply the CHG Soap to your body ONLY FROM THE NECK DOWN.  Do not use on open wounds or open sores. Avoid contact with your eyes, ears, mouth and genitals (private parts). Wash Face and genitals (private parts)  with your normal soap.   6. Wash thoroughly, paying special attention to the area where your surgery will be performed.  7. Thoroughly rinse your body with warm water from the neck down.  8. DO NOT shower/wash with your normal soap after using and rinsing off the CHG Soap.  9. Pat yourself dry with a CLEAN TOWEL.  10. Wear CLEAN PAJAMAS to bed the night before surgery  11. Place CLEAN SHEETS on your bed  the night of your first shower and DO NOT SLEEP WITH PETS.   Day of Surgery: Wear Clean/Comfortable clothing the morning of surgery Do not apply any deodorants/lotions.   Remember to brush your teeth WITH YOUR REGULAR TOOTHPASTE.   Please read over the following fact sheets that you were given.

## 2020-01-12 ENCOUNTER — Inpatient Hospital Stay (HOSPITAL_COMMUNITY): Payer: BLUE CROSS/BLUE SHIELD | Admitting: Physician Assistant

## 2020-01-12 ENCOUNTER — Inpatient Hospital Stay (HOSPITAL_COMMUNITY): Payer: BLUE CROSS/BLUE SHIELD | Admitting: Anesthesiology

## 2020-01-12 NOTE — Progress Notes (Signed)
Anesthesia Chart Review:  Positive cardiology for history of CAD and peripheral vascular disease. She is s/p BMS stenting of the RCA in 2002. Repeat caths in 2002 and 2005 here showed patent stents. She had cardiac cath in July 2015 at [facility] that showed CTO of the RCA and was treated medically. There was moderate disease noted in the third OM and mild disease in the LAD. She presented in March 2017 with chest pain.She had a Myoview stress test that showed a large partially reversible defect in the apex and inferolateral wall. EF 56%. She then underwent cardiac cath that showed CTO of the RCA at prior stent site. There was a large OM3 with a 99% stenosis that was successfully stented with DES x 2.  Patient was last seen by Dr. Kirke Corin 12/22/2019.  He recently performed lower extremity angiography showing bilateral disease and he recommended bilateral common femoral artery endarterectomy and referred the patient to vascular surgery.  Per his note, the patient does not require ischemic cardiac testing before endarterectomy.  DM2, last A1c 6.7 on 12/22/2019.  Preop labs reviewed, glucose elevated 218, otherwise unremarkable.  EKG 09/08/2019: Sinus rhythm with premature atrial complexes.  LAFB.  Rate 62.  Lateral T wave abnormality.  Cath and PCI 08/26/2015:  Ost RCA to Mid RCA lesion, 100% stenosed. The lesion was previously treated with a bare metal stent greater than two years ago.  1st Diag lesion, 40% stenosed.  Ost LAD to Prox LAD lesion, 20% stenosed.  Prox Cx lesion, 30% stenosed.  3rd Mrg lesion, 99% stenosed. Post intervention, there is a 0% residual stenosis.   1. Severe 2 vessel obstructive CAD    - CTO of the RCA at prior stented segment with left to right collaterals.    - New severe lesion in a large OM3 2. Successful stenting of OM3 with DES x 2.   Plan: Continue DAPT for one year with ASA 81 mg daily and Effient. Risk factor modification. Would continue medical therapy  for CTO of RCA. Could consider for CTO PCI but vessel was small in 2002 so long term patency would not be favorable. Anticipate DC in am.    Zannie Cove [facility] Short Stay Center/Anesthesiology Phone [phone] 01/12/2020 4:54 PM

## 2020-01-12 NOTE — Anesthesia Preprocedure Evaluation (Addendum)
Anesthesia Evaluation    Reviewed: Allergy & Precautions, Patient's Chart, lab work & pertinent test results  History of Anesthesia Complications (+) PONV and history of anesthetic complications  Airway        Dental   Pulmonary asthma , sleep apnea , COPD, Current Smoker,           Cardiovascular hypertension, Pt. on medications + CAD, + Past MI, + Cardiac Stents and + Peripheral Vascular Disease     '17 Cath - Ost RCA to Mid RCA lesion, 100% stenosed. The lesion was previously treated with a bare metal stent greater than two years ago. 1st Diag lesion, 40% stenosed. Ost LAD to Prox LAD lesion, 20% stenosed. Prox Cx lesion, 30% stenosed. 3rd Mrg lesion, 99% stenosed. Post intervention, there is a 0% residual stenosis. 1. Severe 2 vessel obstructive CAD    - CTO of the RCA at prior stented segment with left to right collaterals.    - New severe lesion in a large OM3 2. Successful stenting of OM3 with DES x 2.      Neuro/Psych  Headaches, PSYCHIATRIC DISORDERS Anxiety Depression    GI/Hepatic Neg liver ROS, GERD  Medicated,  Endo/Other  diabetes, Type 2, Insulin DependentHypothyroidism  Obesity   Renal/GU negative Renal ROS     Musculoskeletal  (+) Arthritis , Fibromyalgia -  Abdominal   Peds  Hematology negative hematology ROS (+)   Anesthesia Other Findings Covid test negative PAT note by Antionette Poles, PA-C: Patient was last seen by Dr. Kirke Corin 12/22/2019.  He recently performed lower extremity angiography showing bilateral disease and he recommended bilateral common femoral artery endarterectomy and referred the patient to vascular surgery.  Per his note, the patient does not require ischemic cardiac testing before endarterectomy.   Reproductive/Obstetrics                            Anesthesia Physical Anesthesia Plan  ASA: III  Anesthesia Plan: General   Post-op Pain Management:     Induction: Intravenous  PONV Risk Score and Plan: 4 or greater and Treatment may vary due to age or medical condition, Ondansetron, Dexamethasone and Midazolam  Airway Management Planned: Oral ETT  Additional Equipment: Arterial line  Intra-op Plan:   Post-operative Plan: Extubation in OR  Informed Consent:   Plan Discussed with: CRNA and Anesthesiologist  Anesthesia Plan Comments:        Anesthesia Quick Evaluation

## 2020-01-13 ENCOUNTER — Other Ambulatory Visit (HOSPITAL_COMMUNITY)
Admission: RE | Admit: 2020-01-13 | Discharge: 2020-01-13 | Disposition: A | Discharge status: Home / Self Care | Payer: BLUE CROSS/BLUE SHIELD | Source: Ambulatory Visit | Attending: Vascular Surgery | Admitting: Vascular Surgery

## 2020-01-13 ENCOUNTER — Other Ambulatory Visit (HOSPITAL_COMMUNITY): Payer: Self-pay

## 2020-01-13 DIAGNOSIS — Z20822 Contact with and (suspected) exposure to covid-19: Secondary | ICD-10-CM | POA: Diagnosis not present

## 2020-01-13 DIAGNOSIS — Z01812 Encounter for preprocedural laboratory examination: Secondary | ICD-10-CM | POA: Diagnosis not present

## 2020-01-13 LAB — SARS CORONAVIRUS 2 (TAT 6-24 HRS): SARS Coronavirus 2: NEGATIVE

## 2020-01-14 ENCOUNTER — Telehealth: Payer: Self-pay

## 2020-01-14 NOTE — Telephone Encounter (Signed)
Pt is scheduled for femoral endarterectomy tomorrow. She was told by someone at the hospital she must stop smoking for 24 hours prior to surgery. She is unable to do so. I have made MD aware. Told her to report to hospital as planned and if I hear otherwise, I will call her and let her know. Pt verbalized understanding.

## 2020-01-15 ENCOUNTER — Ambulatory Visit (HOSPITAL_COMMUNITY)
Admission: RE | Admit: 2020-01-15 | Discharge: 2020-01-15 | Disposition: A | Discharge status: Home / Self Care | Payer: BLUE CROSS/BLUE SHIELD | Attending: Vascular Surgery | Admitting: Vascular Surgery

## 2020-01-15 ENCOUNTER — Encounter (HOSPITAL_COMMUNITY): Payer: Self-pay | Admitting: Vascular Surgery

## 2020-01-15 ENCOUNTER — Other Ambulatory Visit: Payer: Self-pay

## 2020-01-15 ENCOUNTER — Encounter (HOSPITAL_COMMUNITY)
Admission: RE | Disposition: A | Discharge status: Home / Self Care | Payer: Self-pay | Source: Home / Self Care | Attending: Vascular Surgery

## 2020-01-15 DIAGNOSIS — I739 Peripheral vascular disease, unspecified: Secondary | ICD-10-CM | POA: Insufficient documentation

## 2020-01-15 LAB — GLUCOSE, CAPILLARY: Glucose-Capillary: 196 mg/dL — ABNORMAL HIGH (ref 70–99)

## 2020-01-15 SURGERY — ENDARTERECTOMY, FEMORAL
Anesthesia: Choice | Laterality: Right

## 2020-01-15 MED ORDER — PROPOFOL 10 MG/ML IV BOLUS
INTRAVENOUS | Status: AC
  Filled 2020-01-15: qty 20

## 2020-01-15 MED ORDER — CHLORHEXIDINE GLUCONATE CLOTH 2 % EX PADS: 6.0000 | MEDICATED_PAD | Freq: Once | CUTANEOUS | Status: DC

## 2020-01-15 MED ORDER — MIDAZOLAM HCL 2 MG/2ML IJ SOLN
INTRAMUSCULAR | Status: AC
  Filled 2020-01-15: qty 2

## 2020-01-15 MED ORDER — SODIUM CHLORIDE 0.9 % IV SOLN: INTRAVENOUS | Status: DC

## 2020-01-15 MED ORDER — FENTANYL CITRATE (PF) 250 MCG/5ML IJ SOLN
INTRAMUSCULAR | Status: AC
  Filled 2020-01-15: qty 5

## 2020-01-15 MED ORDER — CHLORHEXIDINE GLUCONATE 0.12 % MT SOLN
OROMUCOSAL | Status: AC
  Administered 2020-01-15: 15 mL
  Filled 2020-01-15: qty 15

## 2020-01-15 NOTE — Progress Notes (Signed)
64 year old female scheduled for right femoral endarterectomy today for lifestyle limiting claudication.  Ultimately I was notified by anesthesia this morning that she was very emotional and upset and likely wanted to reschedule her surgery.  I had a long conversation with her at bedside and ultimately she does not feel that she is in the right head space and is having a panic attack.  States she has really not felt well recently and needs some help from her PCP due to a bunch of emotional stress.  She has had no progression of her symptoms and remains with claudication symptoms.  I discussed that this is not a limb threatening situation there are certainly no urgent or emergent indication to do surgery today if she feels she is not ready.  She can follow-up with her PCP and I will schedule her to see me in the office in 1 month to see how she is doing and we will make a decision in the future about rescheduling surgery.  Cephus Shelling, MD [facility] Office: [phone]   Cephus Shelling

## 2020-02-16 ENCOUNTER — Other Ambulatory Visit: Payer: Self-pay

## 2020-02-16 ENCOUNTER — Encounter: Payer: Self-pay | Admitting: Vascular Surgery

## 2020-02-16 ENCOUNTER — Ambulatory Visit: Payer: BLUE CROSS/BLUE SHIELD | Admitting: Vascular Surgery

## 2020-02-16 VITALS — BP 166/73 | HR 53 | Temp 97.3°F | Resp 16 | Ht 60.0 in | Wt 191.0 lb

## 2020-02-16 DIAGNOSIS — I739 Peripheral vascular disease, unspecified: Secondary | ICD-10-CM | POA: Diagnosis not present

## 2020-02-16 NOTE — Progress Notes (Signed)
Patient name: Courtney Barker MRN: [id number] DOB: 21-Apr-1956 Sex: female  REASON FOR VISIT: Follow-up to discuss bilateral femoral endarterectomies for lower extremity claudication  HPI: Courtney Barker is a 64 y.o. female with history of hypertension, hyperlipidemia, diabetes, obesity, COPD, coronary artery disease status post remote PCI presents for hospital follow-up to discuss femoral endarterectomy.  She was recently scheduled for surgery last month for right femoral endarterectomy.  On the morning of surgery she ultimately had a panic attack and her surgery had to be canceled as she did not feel she was in the right "head space."  She feels the right leg is worse than the left.  Still having claudication that she describes as short distance with burning in the calf.  She has since followed up with her PCP and states she has a prescription for diazepam for her next surgery date.  Vascular surgery was previously consulted for bilateral femoral artery endarterectomies in the setting of lower extremity lifestyle limiting claudication.  She had been under the care of Dr. Kirke Corin and underwent left common femoral access  with aortogram lower extremity runoff.  On arteriogram she had moderate bilateral iliac disease but no significant gradient on pullback pressures.  The right lower extremity showed severely stenosed right common femoral artery with a short distal SFA occlusion and three-vessel runoff.  The left leg had a severe common femoral stenosis with patent three-vessel runoff.      Past Medical History:  Diagnosis Date  . Anxiety   . Arthritis    "head to toe; neck, hands, arms, knees, feet, ankles" (08/26/2015)  . Asthma   . Cervical spondylosis with myelopathy    s/p C5-6 and C6-7 Anterior cervical discectomy/decompression; C5-6 and C6-7 interbody arthrodesis with local morcellized autograft bone and Actifuse bone graft extender; insertion of interbody prosthesis at C5-6 and C6-7 (Zimmer peek  interbody prosthesis); anterior cervical plating from C5-6 and C6-7 with globus titanium plate 4/0/81  . Chronic lower back pain   . COPD (chronic obstructive pulmonary disease) (HCC)   . Coronary artery disease    s/p BMS x3 RCA '02; repeat cath in '02 and '05 were without significant ISR and normal LV function; pt reported unremarkable nuclear myoview 2011/12  . Depression   . Fibromyalgia   . GERD (gastroesophageal reflux disease)   . Headache(784.0)    "2-3 times/week" (08/26/2015)  . Heart murmur   . Hyperlipidemia    statin intolerant, to be followed by lipid clinic  . Hypertension   . Hypothyroidism   . Increased urinary protein excretion   . Myocardial infarction [facility]) 12/2013   "[place]"  . Neuromuscular disorder (HCC)   . PONV (postoperative nausea and vomiting)   . Sleep apnea    NO STUDY DONE .[name].   . Thyroid nodule   . Tobacco abuse   . Type II diabetes mellitus (HCC)     Past Surgical History:  Procedure Laterality Date  . ABDOMINAL AORTOGRAM W/LOWER EXTREMITY N/A 11/25/2019   Procedure: ABDOMINAL AORTOGRAM W/LOWER EXTREMITY;  Surgeon: Iran Ouch, MD;  Location: [facility] INVASIVE CV LAB;  Service: Cardiovascular;  Laterality: N/A;  . ABDOMINAL HYSTERECTOMY  2000   "fibroids; took 1 ovary"  . ANTERIOR CERVICAL DECOMP/DISCECTOMY FUSION  08/23/2011   Procedure: ANTERIOR CERVICAL DECOMPRESSION/DISCECTOMY FUSION 2 LEVELS;  Surgeon: Cristi Loron, MD;  Location: [facility] NEURO ORS;  Service: Neurosurgery;  Laterality: N/A;  Anterior Cervical Five-Six/Six-Seven Decompression with Fusion with Interbody Prothesis, Plating, and Bonegraft  . BACK  SURGERY    . BOWEL RESECTION  2007   . CARDIAC CATHETERIZATION N/A 08/26/2015   Procedure: Coronary Stent Intervention;  Surgeon: Peter M Sw[place], MD;  Location: [facility] INVASIVE CV LAB;  Service: Cardiovascular;  Laterality: N/A;  OM3 and circ  . CARDIAC CATHETERIZATION  2002; 2005   patent stents  . CARDIAC CATHETERIZATION  12/2013   CTO of  the RCA ; moderate disease noted in the third OM and mild disease in the LAD; treated medically  . COLONOSCOPY W/ POLYPECTOMY    . CORONARY ANGIOPLASTY WITH STENT PLACEMENT  2002   s/p BMS x3 RCA   . MULTIPLE TOOTH EXTRACTIONS  2000  . TONSILLECTOMY    . TUBAL LIGATION  1981    Family History  Problem Relation Age of Onset  . Anesthesia problems Mother   . Anesthesia problems Sister   . Neuropathy Father   . Heart attack Father        Cause of death  . Neuropathy Sister   . Diabetes Sister   . Heart Problems Sister   . Diabetes Maternal Grandfather   . Lupus Sister     SOCIAL HISTORY: Social History   Tobacco Use  . Smoking status: Current Every Day Smoker    Packs/day: 1.00    Years: 43.00    Pack years: 43.00    Types: Cigarettes  . Smokeless tobacco: Never Used  Substance Use Topics  . Alcohol use: No    Alcohol/week: 0.0 standard drinks    Allergies  Allergen Reactions  . Candesartan Cilexetil Shortness Of Breath  . Clonidine Derivatives Shortness Of Breath and Swelling  . Lisinopril Shortness Of Breath and Swelling  . Losartan Palpitations  . Penicillins Anaphylaxis  . Plavix [Clopidogrel Bisulfate] Itching  . Quinapril Hcl Shortness Of Breath and Swelling  . Repaglinide Palpitations  . Toprol Xl [Metoprolol Tartrate] Shortness Of Breath and Swelling  . Trovan [Alatrofloxacin] Shortness Of Breath and Swelling  . Atorvastatin Other (See Comments)    myalgias  . Clarithromycin Nausea And Vomiting  . Ezetimibe Other (See Comments)    GI bleeding  . Hydromorphone Hcl Nausea And Vomiting  . Metoclopramide Hcl Other (See Comments)    Reaction unknown  . Morphine And Related Nausea And Vomiting  . Oxycodone-Acetaminophen Other (See Comments)    Body feels like bee stings  . Paroxetine Hcl Other (See Comments)    Freezing, burning from inside out.  . Pioglitazone Swelling and Other (See Comments)  . Valsartan Palpitations  . Vancomycin Other (See  Comments)    Reaction unknown    Current Outpatient Medications  Medication Sig Dispense Refill  . acetaminophen (TYLENOL) 500 MG tablet Take 1,000 mg by mouth every 6 (six) hours as needed (pain).     [name] albuterol (PROVENTIL HFA;VENTOLIN HFA) 108 (90 BASE) MCG/ACT inhaler Inhale 2 puffs into the lungs every 6 (six) hours as needed for wheezing or shortness of breath.    [name] aspirin 81 MG tablet Take 81 mg by mouth daily.    . bisacodyl (DULCOLAX) 5 MG EC tablet Take 5 mg by mouth daily as needed for mild constipation.     . bisoprolol-hydrochlorothiazide (ZIAC) 5-6.25 MG tablet Take 1 tablet by mouth 2 (two) times daily. (Patient taking differently: Take 2 tablets by mouth 2 (two) times daily. )    . fexofenadine (ALLEGRA) 60 MG tablet Take 60 mg by mouth 2 (two) times daily as needed for allergies or rhinitis.    . fluticasone (  FLONASE) 50 MCG/ACT nasal spray Place 2 sprays into the nose daily as needed for allergies.     . furosemide (LASIX) 40 MG tablet Take 40 mg by mouth daily.     . indomethacin (INDOCIN) 25 MG capsule Take 25 mg by mouth daily as needed for mild pain.    [name] LANTUS 100 UNIT/ML injection Inject 155 Units into the skin at bedtime.   5  . magnesium oxide (MAG-OX) 400 MG tablet Take 200 mg by mouth daily.     . metFORMIN (GLUCOPHAGE-XR) 500 MG 24 hr tablet Take 2 tablets (1,000 mg total) by mouth 2 (two) times daily. (Patient taking differently: Take 500 mg by mouth in the morning and at bedtime. )    . Neomy-Bacit-Polymyx-Pramoxine (NEOSPORIN + PAIN RELIEF MAX ST) 1 % OINT Apply 1 application topically daily as needed (wound care).    . nitroGLYCERIN (NITROSTAT) 0.4 MG SL tablet DISSOLVE 1 TABLET UNDER THE TONGUE EVERY 5 MINUTES UP TO 3 DOSES (Patient taking differently: Place 0.4 mg under the tongue every 5 (five) minutes as needed for chest pain. ) 25 tablet 6  . NOVOLOG 100 UNIT/ML injection Inject 50-60 Units into the skin See admin instructions. INJECT 50-55 UNITS INTO THE  SKIN WITH BREAKFAST, INJECT 55-60 UNITS INTO THE SKIN AT LUNCH TIME, INJECT 60 UNITS INTO THE SKIN WITH SUPPER. ANY TIME YOUR BG ARE GREATER THAN 150, INJECT INTO THE SKIN AS DIRECTED PER SLIDING SCALE  5  . polyethylene glycol (MIRALAX / GLYCOLAX) packet Take 17 g by mouth daily as needed for moderate constipation.     . potassium chloride SA (K-DUR,KLOR-CON) 20 MEQ tablet Take 10 mEq by mouth daily.     . RABEprazole (ACIPHEX) 20 MG tablet Take 20 mg by mouth daily.    . Simethicone (GAS-X PO) Take 1-2 tablets by mouth 2 (two) times daily as needed (gas).    . SYNTHROID 150 MCG tablet Take 150-225 mcg by mouth See admin instructions. Take 150 mcg daily except on Sundays take 225 mcg (Brand Only)  5  . Vitamin D, Ergocalciferol, (DRISDOL) 50000 UNITS CAPS Take 50,000 Units by mouth every Sunday.      No current facility-administered medications for this visit.    REVIEW OF SYSTEMS:  [X]  denotes positive finding, [ ]  denotes negative finding Cardiac  Comments:  Chest pain or chest pressure:    Shortness of breath upon exertion:    Short of breath when lying flat:    Irregular heart rhythm:        Vascular    Pain in calf, thigh, or hip brought on by ambulation:    Pain in feet at night that wakes you up from your sleep:     Blood clot in your veins:    Leg swelling:         Pulmonary    Oxygen at home:    Productive cough:     Wheezing:         Neurologic    Sudden weakness in arms or legs:     Sudden numbness in arms or legs:     Sudden onset of difficulty speaking or slurred speech:    Temporary loss of vision in one eye:     Problems with dizziness:         Gastrointestinal    Blood in stool:     Vomited blood:         Genitourinary    Burning when urinating:  Blood in urine:        Psychiatric    Major depression:         Hematologic    Bleeding problems:    Problems with blood clotting too easily:        Skin    Rashes or ulcers:        Constitutional      Fever or chills:      PHYSICAL EXAM: Vitals:   02/16/20 1512  BP: (!) 166/73  Pulse: (!) 53  Resp: 16  Temp: (!) 97.3 F (36.3 C)  TempSrc: Temporal  SpO2: 98%  Weight: 191 lb (86.6 kg)  Height: 5' (1.524 m)    GENERAL: The patient is a well-nourished female, in no acute distress. The vital signs are documented above. CARDIAC: There is a regular rate and rhythm.  VASCULAR:  Weak femoral pulses bilaterally PULMONARY: There is good air exchange bilaterally without wheezing or rales. ABDOMEN: Soft and non-tender with normal pitched bowel sounds.  MUSCULOSKELETAL: There are no major deformities or cyanosis. NEUROLOGIC: No focal weakness or paresthesias are detected. SKIN: There are no ulcers or rashes noted. PSYCHIATRIC: The patient has a normal affect.  DATA:   Previous arteriogram shows an 80% right common femoral stenosis with right SFA short chronic total occlusion.  The left common femoral is heavily calcified and subtotally occluded with near 95% stenosis.  Assessment/Plan:  64 year old female with multiple medical comorbidities as documented above that presents to discuss previously planned right femoral endarterectomy.  She was scheduled for surgery last month and ultimately had a panic attack in holding and her surgery had to be canceled.  I had her follow-up today after seeing her PCP to discuss next steps.  She states that she is in a better head space and now has a prescription for diazepam to take on the way to the hospital.  She still has lifestyle limiting claudication worse in the right leg and discussed that with the Covid pandemic and bed shortage we have been delaying any interventional on claudication at this time given this is not a limb threatening situation until the Covid pandemic improves.  We will get her scheduled for the second week in October at her request for right femoral endarterectomy.  Discussed some risk we may have to delay her surgery further  pending the pandemic at that time.   Cephus Shelling, MD [facility] Office: [phone]

## 2020-02-19 ENCOUNTER — Other Ambulatory Visit: Payer: Self-pay | Admitting: *Deleted

## 2020-02-19 ENCOUNTER — Encounter: Payer: Self-pay | Admitting: *Deleted

## 2020-02-24 ENCOUNTER — Other Ambulatory Visit: Payer: Self-pay | Admitting: Internal Medicine

## 2020-03-29 ENCOUNTER — Ambulatory Visit: Payer: BLUE CROSS/BLUE SHIELD | Admitting: Cardiovascular Disease

## 2020-03-31 ENCOUNTER — Other Ambulatory Visit (HOSPITAL_COMMUNITY): Payer: BLUE CROSS/BLUE SHIELD

## 2020-04-20 ENCOUNTER — Other Ambulatory Visit (HOSPITAL_COMMUNITY)
Admission: RE | Admit: 2020-04-20 | Discharge: 2020-04-20 | Disposition: A | Discharge status: Home / Self Care | Payer: BLUE CROSS/BLUE SHIELD | Source: Ambulatory Visit | Attending: Vascular Surgery | Admitting: Vascular Surgery

## 2020-04-20 DIAGNOSIS — Z01812 Encounter for preprocedural laboratory examination: Secondary | ICD-10-CM | POA: Insufficient documentation

## 2020-04-20 DIAGNOSIS — Z20822 Contact with and (suspected) exposure to covid-19: Secondary | ICD-10-CM | POA: Insufficient documentation

## 2020-04-20 LAB — SARS CORONAVIRUS 2 (TAT 6-24 HRS): SARS Coronavirus 2: NEGATIVE

## 2020-04-21 ENCOUNTER — Other Ambulatory Visit: Payer: Self-pay

## 2020-04-21 ENCOUNTER — Encounter (HOSPITAL_COMMUNITY): Payer: Self-pay | Admitting: Vascular Surgery

## 2020-04-21 NOTE — Progress Notes (Signed)
Spoke with pt for pre-op call. Pt is extremely claustrophobic and anxious. She will be taking a Valium prior to arrival, but wants everyone to know she is scared. She asks that she is not strapped down unless she is "out of it".  Pt is a type 2 diabetic. Last A1C was 7.9 on 03/10/20. States her fasting blood sugar is usually between 87-200. Instructed pt to take 1/2 of her regular dose of Lantus this evening, she will take 52 units. Pt does take Novolog, but doesn't have a sliding scale except for greater than 300. I instructed her not to take it in the AM. (I spoke with Lula Olszewski, Diabetes Coordinator and she agreed that pt should not take it and to treat on arrival if needed). Pt understands not to take Metformin in the AM. Instructed pt to check her blood sugar in the AM when she gets up and every 2 hours until she leaves for the hospital. If blood sugar is 70 or below, treat with 1/2 cup of clear juice (apple or cranberry) and recheck blood sugar 15 minutes after drinking juice. If blood sugar continues to be 70 or below or is greater than 220, instructed pt to let her nurse know as soon as she arrives in the AM. Pt voiced understanding.  Covid test done 04/20/20 and it's negative.    Chart reviewed by Antionette Poles, PA in July, 2021 for same procedure and it was cancelled due to pt's anxiety. Informed Fayrene Fearing that pt is scheduled for surgery tomorrow.

## 2020-04-22 ENCOUNTER — Encounter (HOSPITAL_COMMUNITY): Payer: Self-pay | Admitting: Vascular Surgery

## 2020-04-22 ENCOUNTER — Inpatient Hospital Stay (HOSPITAL_COMMUNITY): Payer: BLUE CROSS/BLUE SHIELD | Admitting: Physician Assistant

## 2020-04-22 ENCOUNTER — Inpatient Hospital Stay (HOSPITAL_COMMUNITY)
Admission: RE | Admit: 2020-04-22 | Discharge: 2020-04-26 | DRG: 253 | Disposition: A | Discharge status: Home / Self Care | Payer: BLUE CROSS/BLUE SHIELD | Attending: Vascular Surgery | Admitting: Vascular Surgery

## 2020-04-22 ENCOUNTER — Encounter (HOSPITAL_COMMUNITY)
Admission: RE | Disposition: A | Discharge status: Home / Self Care | Payer: Self-pay | Source: Home / Self Care | Attending: Vascular Surgery

## 2020-04-22 DIAGNOSIS — Z8249 Family history of ischemic heart disease and other diseases of the circulatory system: Secondary | ICD-10-CM | POA: Diagnosis not present

## 2020-04-22 DIAGNOSIS — Z7984 Long term (current) use of oral hypoglycemic drugs: Secondary | ICD-10-CM

## 2020-04-22 DIAGNOSIS — E785 Hyperlipidemia, unspecified: Secondary | ICD-10-CM | POA: Diagnosis present

## 2020-04-22 DIAGNOSIS — G8929 Other chronic pain: Secondary | ICD-10-CM | POA: Diagnosis present

## 2020-04-22 DIAGNOSIS — F32A Depression, unspecified: Secondary | ICD-10-CM | POA: Diagnosis present

## 2020-04-22 DIAGNOSIS — F419 Anxiety disorder, unspecified: Secondary | ICD-10-CM | POA: Diagnosis present

## 2020-04-22 DIAGNOSIS — K219 Gastro-esophageal reflux disease without esophagitis: Secondary | ICD-10-CM | POA: Diagnosis present

## 2020-04-22 DIAGNOSIS — E039 Hypothyroidism, unspecified: Secondary | ICD-10-CM | POA: Diagnosis present

## 2020-04-22 DIAGNOSIS — F17210 Nicotine dependence, cigarettes, uncomplicated: Secondary | ICD-10-CM | POA: Diagnosis present

## 2020-04-22 DIAGNOSIS — Z832 Family history of diseases of the blood and blood-forming organs and certain disorders involving the immune mechanism: Secondary | ICD-10-CM

## 2020-04-22 DIAGNOSIS — Z880 Allergy status to penicillin: Secondary | ICD-10-CM

## 2020-04-22 DIAGNOSIS — I2510 Atherosclerotic heart disease of native coronary artery without angina pectoris: Secondary | ICD-10-CM | POA: Diagnosis present

## 2020-04-22 DIAGNOSIS — I252 Old myocardial infarction: Secondary | ICD-10-CM

## 2020-04-22 DIAGNOSIS — I70201 Unspecified atherosclerosis of native arteries of extremities, right leg: Secondary | ICD-10-CM | POA: Diagnosis present

## 2020-04-22 DIAGNOSIS — I10 Essential (primary) hypertension: Secondary | ICD-10-CM | POA: Diagnosis present

## 2020-04-22 DIAGNOSIS — E669 Obesity, unspecified: Secondary | ICD-10-CM | POA: Diagnosis present

## 2020-04-22 DIAGNOSIS — I70211 Atherosclerosis of native arteries of extremities with intermittent claudication, right leg: Secondary | ICD-10-CM | POA: Diagnosis not present

## 2020-04-22 DIAGNOSIS — J449 Chronic obstructive pulmonary disease, unspecified: Secondary | ICD-10-CM | POA: Diagnosis present

## 2020-04-22 DIAGNOSIS — Z79899 Other long term (current) drug therapy: Secondary | ICD-10-CM

## 2020-04-22 DIAGNOSIS — I7092 Chronic total occlusion of artery of the extremities: Secondary | ICD-10-CM | POA: Diagnosis present

## 2020-04-22 DIAGNOSIS — Z833 Family history of diabetes mellitus: Secondary | ICD-10-CM

## 2020-04-22 DIAGNOSIS — Z79890 Hormone replacement therapy (postmenopausal): Secondary | ICD-10-CM

## 2020-04-22 DIAGNOSIS — Z794 Long term (current) use of insulin: Secondary | ICD-10-CM

## 2020-04-22 DIAGNOSIS — Z981 Arthrodesis status: Secondary | ICD-10-CM | POA: Diagnosis not present

## 2020-04-22 DIAGNOSIS — Z885 Allergy status to narcotic agent status: Secondary | ICD-10-CM

## 2020-04-22 DIAGNOSIS — Z7982 Long term (current) use of aspirin: Secondary | ICD-10-CM

## 2020-04-22 DIAGNOSIS — Z6837 Body mass index (BMI) 37.0-37.9, adult: Secondary | ICD-10-CM | POA: Diagnosis not present

## 2020-04-22 DIAGNOSIS — Z20822 Contact with and (suspected) exposure to covid-19: Secondary | ICD-10-CM | POA: Diagnosis present

## 2020-04-22 DIAGNOSIS — Z881 Allergy status to other antibiotic agents status: Secondary | ICD-10-CM

## 2020-04-22 DIAGNOSIS — Z888 Allergy status to other drugs, medicaments and biological substances status: Secondary | ICD-10-CM | POA: Diagnosis not present

## 2020-04-22 DIAGNOSIS — M797 Fibromyalgia: Secondary | ICD-10-CM | POA: Diagnosis present

## 2020-04-22 DIAGNOSIS — Z9889 Other specified postprocedural states: Secondary | ICD-10-CM | POA: Diagnosis not present

## 2020-04-22 DIAGNOSIS — E1151 Type 2 diabetes mellitus with diabetic peripheral angiopathy without gangrene: Principal | ICD-10-CM | POA: Diagnosis present

## 2020-04-22 DIAGNOSIS — M109 Gout, unspecified: Secondary | ICD-10-CM | POA: Diagnosis present

## 2020-04-22 DIAGNOSIS — I739 Peripheral vascular disease, unspecified: Secondary | ICD-10-CM | POA: Diagnosis present

## 2020-04-22 HISTORY — PX: PATCH ANGIOPLASTY: SHX6230

## 2020-04-22 HISTORY — DX: Claustrophobia: F40.240

## 2020-04-22 HISTORY — PX: ENDARTERECTOMY FEMORAL: SHX5804

## 2020-04-22 LAB — CBC
HCT: 43.1 % (ref 36.0–46.0)
Hemoglobin: 15.7 g/dL — ABNORMAL HIGH (ref 12.0–15.0)
MCH: 33.9 pg (ref 26.0–34.0)
MCHC: 36.4 g/dL — ABNORMAL HIGH (ref 30.0–36.0)
MCV: 93.1 fL (ref 80.0–100.0)
Platelets: 223 10*3/uL (ref 150–400)
RBC: 4.63 MIL/uL (ref 3.87–5.11)
RDW: 12 % (ref 11.5–15.5)
WBC: 10.5 10*3/uL (ref 4.0–10.5)
nRBC: 0 % (ref 0.0–0.2)

## 2020-04-22 LAB — COMPREHENSIVE METABOLIC PANEL
ALT: 24 U/L (ref 0–44)
AST: 24 U/L (ref 15–41)
Albumin: 4.1 g/dL (ref 3.5–5.0)
Alkaline Phosphatase: 62 U/L (ref 38–126)
Anion gap: 14 (ref 5–15)
BUN: 13 mg/dL (ref 8–23)
CO2: 26 mmol/L (ref 22–32)
Calcium: 9.3 mg/dL (ref 8.9–10.3)
Chloride: 94 mmol/L — ABNORMAL LOW (ref 98–111)
Creatinine, Ser: 0.92 mg/dL (ref 0.44–1.00)
GFR, Estimated: >60 mL/min (ref >60)
Glucose, Bld: 232 mg/dL — ABNORMAL HIGH (ref 70–99)
Potassium: 3.5 mmol/L (ref 3.5–5.1)
Sodium: 134 mmol/L — ABNORMAL LOW (ref 135–145)
Total Bilirubin: 0.5 mg/dL (ref 0.3–1.2)
Total Protein: 7.4 g/dL (ref 6.5–8.1)

## 2020-04-22 LAB — GLUCOSE, CAPILLARY
Glucose-Capillary: 246 mg/dL — ABNORMAL HIGH (ref 70–99)
Glucose-Capillary: 225 mg/dL — ABNORMAL HIGH (ref 70–99)
Glucose-Capillary: 287 mg/dL — ABNORMAL HIGH (ref 70–99)
Glucose-Capillary: 267 mg/dL — ABNORMAL HIGH (ref 70–99)

## 2020-04-22 LAB — URINALYSIS, ROUTINE W REFLEX MICROSCOPIC
Bilirubin Urine: NEGATIVE
Glucose, UA: NEGATIVE mg/dL
Hgb urine dipstick: NEGATIVE
Ketones, ur: NEGATIVE mg/dL
Leukocytes,Ua: NEGATIVE
Nitrite: NEGATIVE
Protein, ur: NEGATIVE mg/dL
Specific Gravity, Urine: 1.014 (ref 1.005–1.030)
pH: 5 (ref 5.0–8.0)

## 2020-04-22 LAB — APTT: aPTT: 26 seconds (ref 24–36)

## 2020-04-22 LAB — PROTIME-INR
INR: 1 (ref 0.8–1.2)
Prothrombin Time: 12.9 seconds (ref 11.4–15.2)

## 2020-04-22 SURGERY — ENDARTERECTOMY, FEMORAL
Anesthesia: General | Site: Groin | Laterality: Right

## 2020-04-22 MED ORDER — KETOROLAC TROMETHAMINE 0.5 % OP SOLN
1.0000 [drp] | Freq: Three times a day (TID) | OPHTHALMIC | Status: AC | PRN
  Administered 2020-04-22: 1 [drp] via OPHTHALMIC
  Filled 2020-04-22: qty 5

## 2020-04-22 MED ORDER — AMISULPRIDE (ANTIEMETIC) 5 MG/2ML IV SOLN
INTRAVENOUS | Status: AC
  Filled 2020-04-22: qty 2

## 2020-04-22 MED ORDER — SODIUM CHLORIDE 0.9 % IV SOLN: INTRAVENOUS | Status: DC

## 2020-04-22 MED ORDER — MAGNESIUM 200 MG PO TABS: 200.0000 mg | ORAL_TABLET | Freq: Every evening | ORAL | Status: DC

## 2020-04-22 MED ORDER — ALBUTEROL SULFATE (2.5 MG/3ML) 0.083% IN NEBU: 2.5000 mg | INHALATION_SOLUTION | Freq: Four times a day (QID) | RESPIRATORY_TRACT | Status: DC | PRN

## 2020-04-22 MED ORDER — MIDAZOLAM HCL 2 MG/2ML IJ SOLN
INTRAMUSCULAR | Status: AC
  Filled 2020-04-22: qty 2

## 2020-04-22 MED ORDER — LEVOTHYROXINE SODIUM 75 MCG PO TABS: 150.0000 ug | ORAL_TABLET | ORAL | Status: DC

## 2020-04-22 MED ORDER — 0.9 % SODIUM CHLORIDE (POUR BTL) OPTIME
TOPICAL | Status: DC | PRN
  Administered 2020-04-22 (×2): 1000 mL

## 2020-04-22 MED ORDER — METFORMIN HCL ER 500 MG PO TB24
500.0000 mg | ORAL_TABLET | Freq: Every day | ORAL | Status: DC
  Administered 2020-04-23 – 2020-04-26 (×4): 500 mg via ORAL
  Filled 2020-04-22 (×4): qty 1

## 2020-04-22 MED ORDER — INDOMETHACIN 25 MG PO CAPS
25.0000 mg | ORAL_CAPSULE | Freq: Every day | ORAL | Status: DC | PRN
  Administered 2020-04-23: 25 mg via ORAL
  Filled 2020-04-22 (×2): qty 1

## 2020-04-22 MED ORDER — ONDANSETRON HCL 4 MG/2ML IJ SOLN
INTRAMUSCULAR | Status: DC | PRN
  Administered 2020-04-22: 4 mg via INTRAVENOUS

## 2020-04-22 MED ORDER — LIDOCAINE 2% (20 MG/ML) 5 ML SYRINGE
INTRAMUSCULAR | Status: DC | PRN
  Administered 2020-04-22: 40 mg via INTRAVENOUS

## 2020-04-22 MED ORDER — GUAIFENESIN-DM 100-10 MG/5ML PO SYRP: 15.0000 mL | ORAL_SOLUTION | ORAL | Status: DC | PRN

## 2020-04-22 MED ORDER — CHLORHEXIDINE GLUCONATE CLOTH 2 % EX PADS: 6.0000 | MEDICATED_PAD | Freq: Once | CUTANEOUS | Status: DC

## 2020-04-22 MED ORDER — CLINDAMYCIN PHOSPHATE 900 MG/50ML IV SOLN
900.0000 mg | Freq: Four times a day (QID) | INTRAVENOUS | Status: DC
  Administered 2020-04-22 – 2020-04-25 (×11): 900 mg via INTRAVENOUS
  Filled 2020-04-22 (×18): qty 50

## 2020-04-22 MED ORDER — PANTOPRAZOLE SODIUM 40 MG PO TBEC
40.0000 mg | DELAYED_RELEASE_TABLET | Freq: Every day | ORAL | Status: DC
  Filled 2020-04-22: qty 1

## 2020-04-22 MED ORDER — BISACODYL 5 MG PO TBEC: 5.0000 mg | DELAYED_RELEASE_TABLET | Freq: Every day | ORAL | Status: DC | PRN

## 2020-04-22 MED ORDER — ACETAMINOPHEN 325 MG PO TABS: 325.0000 mg | ORAL_TABLET | Freq: Once | ORAL | Status: DC | PRN

## 2020-04-22 MED ORDER — SODIUM CHLORIDE 0.9 % IV SOLN
INTRAVENOUS | Status: AC
  Filled 2020-04-22: qty 1.2

## 2020-04-22 MED ORDER — POLYETHYLENE GLYCOL 3350 17 GM/SCOOP PO POWD
17.0000 g | Freq: Every day | ORAL | Status: DC | PRN
  Filled 2020-04-22: qty 255

## 2020-04-22 MED ORDER — SUGAMMADEX SODIUM 200 MG/2ML IV SOLN
INTRAVENOUS | Status: DC | PRN
  Administered 2020-04-22: 200 mg via INTRAVENOUS

## 2020-04-22 MED ORDER — PHENOL 1.4 % MT LIQD: 1.0000 | OROMUCOSAL | Status: DC | PRN

## 2020-04-22 MED ORDER — SODIUM CHLORIDE 0.9 % IV SOLN: INTRAVENOUS | Status: DC | PRN

## 2020-04-22 MED ORDER — DEXAMETHASONE SODIUM PHOSPHATE 10 MG/ML IJ SOLN
INTRAMUSCULAR | Status: DC | PRN
  Administered 2020-04-22: 4 mg via INTRAVENOUS

## 2020-04-22 MED ORDER — ASPIRIN EC 81 MG PO TBEC: 81.0000 mg | DELAYED_RELEASE_TABLET | Freq: Every evening | ORAL | Status: DC

## 2020-04-22 MED ORDER — INSULIN ASPART 100 UNIT/ML ~~LOC~~ SOLN
55.0000 [IU] | Freq: Every day | SUBCUTANEOUS | Status: DC
  Administered 2020-04-24: 30 [IU] via SUBCUTANEOUS

## 2020-04-22 MED ORDER — KETOROLAC TROMETHAMINE 0.5 % OP SOLN
OPHTHALMIC | Status: AC
  Filled 2020-04-22: qty 5

## 2020-04-22 MED ORDER — FUROSEMIDE 40 MG PO TABS
40.0000 mg | ORAL_TABLET | Freq: Every evening | ORAL | Status: DC
  Administered 2020-04-22 – 2020-04-25 (×3): 40 mg via ORAL
  Filled 2020-04-22 (×4): qty 1

## 2020-04-22 MED ORDER — MEPERIDINE HCL 25 MG/ML IJ SOLN: 6.2500 mg | INTRAMUSCULAR | Status: DC | PRN

## 2020-04-22 MED ORDER — SUCCINYLCHOLINE CHLORIDE 20 MG/ML IJ SOLN
INTRAMUSCULAR | Status: DC | PRN
  Administered 2020-04-22: 120 mg via INTRAVENOUS

## 2020-04-22 MED ORDER — INSULIN ASPART 100 UNIT/ML ~~LOC~~ SOLN
70.0000 [IU] | Freq: Two times a day (BID) | SUBCUTANEOUS | Status: DC
  Administered 2020-04-23: 70 [IU] via SUBCUTANEOUS
  Administered 2020-04-25: 50 [IU] via SUBCUTANEOUS
  Administered 2020-04-26: 70 [IU] via SUBCUTANEOUS

## 2020-04-22 MED ORDER — VITAMIN D (ERGOCALCIFEROL) 1.25 MG (50000 UNIT) PO CAPS
50000.0000 [IU] | ORAL_CAPSULE | ORAL | Status: DC
  Administered 2020-04-24: 50000 [IU] via ORAL
  Filled 2020-04-22: qty 1

## 2020-04-22 MED ORDER — LIDOCAINE 2% (20 MG/ML) 5 ML SYRINGE
INTRAMUSCULAR | Status: AC
  Filled 2020-04-22: qty 5

## 2020-04-22 MED ORDER — HEMOSTATIC AGENTS (NO CHARGE) OPTIME
TOPICAL | Status: DC | PRN
  Administered 2020-04-22 (×2): 1 via TOPICAL

## 2020-04-22 MED ORDER — LACTATED RINGERS IV SOLN: INTRAVENOUS | Status: DC

## 2020-04-22 MED ORDER — MAGNESIUM OXIDE 400 (241.3 MG) MG PO TABS
200.0000 mg | ORAL_TABLET | Freq: Every evening | ORAL | Status: DC
  Administered 2020-04-22 – 2020-04-25 (×4): 200 mg via ORAL
  Filled 2020-04-22 (×4): qty 1

## 2020-04-22 MED ORDER — ACETAMINOPHEN 160 MG/5ML PO SOLN: 325.0000 mg | Freq: Once | ORAL | Status: DC | PRN

## 2020-04-22 MED ORDER — SODIUM CHLORIDE 0.9 % IV SOLN: 500.0000 mL | Freq: Once | INTRAVENOUS | Status: DC | PRN

## 2020-04-22 MED ORDER — ASPIRIN EC 81 MG PO TBEC
81.0000 mg | DELAYED_RELEASE_TABLET | Freq: Every day | ORAL | Status: DC
  Administered 2020-04-23 – 2020-04-26 (×4): 81 mg via ORAL
  Filled 2020-04-22 (×4): qty 1

## 2020-04-22 MED ORDER — PROTAMINE SULFATE 10 MG/ML IV SOLN
INTRAVENOUS | Status: DC | PRN
  Administered 2020-04-22: 50 mg via INTRAVENOUS

## 2020-04-22 MED ORDER — PROPOFOL 10 MG/ML IV BOLUS
INTRAVENOUS | Status: DC | PRN
  Administered 2020-04-22: 120 mg via INTRAVENOUS

## 2020-04-22 MED ORDER — AMISULPRIDE (ANTIEMETIC) 5 MG/2ML IV SOLN
INTRAVENOUS | Status: AC
  Filled 2020-04-22: qty 4

## 2020-04-22 MED ORDER — PROPOFOL 10 MG/ML IV BOLUS
INTRAVENOUS | Status: AC
  Filled 2020-04-22: qty 20

## 2020-04-22 MED ORDER — EPHEDRINE SULFATE 50 MG/ML IJ SOLN
INTRAMUSCULAR | Status: DC | PRN
  Administered 2020-04-22: 5 mg via INTRAVENOUS
  Administered 2020-04-22: 10 mg via INTRAVENOUS
  Administered 2020-04-22: 5 mg via INTRAVENOUS

## 2020-04-22 MED ORDER — DOCUSATE SODIUM 100 MG PO CAPS
100.0000 mg | ORAL_CAPSULE | Freq: Every day | ORAL | Status: DC
  Administered 2020-04-23 – 2020-04-25 (×3): 100 mg via ORAL
  Filled 2020-04-22 (×4): qty 1

## 2020-04-22 MED ORDER — POTASSIUM CHLORIDE CRYS ER 20 MEQ PO TBCR
20.0000 meq | EXTENDED_RELEASE_TABLET | Freq: Every evening | ORAL | Status: DC
  Administered 2020-04-22: 20 meq via ORAL
  Administered 2020-04-24: 10 meq via ORAL
  Administered 2020-04-25: 20 meq via ORAL
  Filled 2020-04-22 (×4): qty 1

## 2020-04-22 MED ORDER — FENTANYL CITRATE (PF) 250 MCG/5ML IJ SOLN
INTRAMUSCULAR | Status: DC | PRN
  Administered 2020-04-22 (×6): 50 ug via INTRAVENOUS
  Administered 2020-04-22: 100 ug via INTRAVENOUS

## 2020-04-22 MED ORDER — MIDAZOLAM HCL 5 MG/5ML IJ SOLN
INTRAMUSCULAR | Status: DC | PRN
  Administered 2020-04-22: 2 mg via INTRAVENOUS

## 2020-04-22 MED ORDER — PHENYLEPHRINE 40 MCG/ML (10ML) SYRINGE FOR IV PUSH (FOR BLOOD PRESSURE SUPPORT)
PREFILLED_SYRINGE | INTRAVENOUS | Status: DC | PRN
  Administered 2020-04-22 (×3): 80 ug via INTRAVENOUS

## 2020-04-22 MED ORDER — ONDANSETRON HCL 4 MG/2ML IJ SOLN: 4.0000 mg | Freq: Four times a day (QID) | INTRAMUSCULAR | Status: DC | PRN

## 2020-04-22 MED ORDER — INSULIN ASPART 100 UNIT/ML ~~LOC~~ SOLN: 0.0000 [IU] | Freq: Three times a day (TID) | SUBCUTANEOUS | Status: DC

## 2020-04-22 MED ORDER — ALBUTEROL SULFATE HFA 108 (90 BASE) MCG/ACT IN AERS: 2.0000 | INHALATION_SPRAY | Freq: Four times a day (QID) | RESPIRATORY_TRACT | Status: DC | PRN

## 2020-04-22 MED ORDER — POLYETHYLENE GLYCOL 3350 17 G PO PACK: 17.0000 g | PACK | Freq: Every day | ORAL | Status: DC | PRN

## 2020-04-22 MED ORDER — CLINDAMYCIN PHOSPHATE 900 MG/50ML IV SOLN
900.0000 mg | Freq: Once | INTRAVENOUS | Status: AC
  Administered 2020-04-22: 900 mg via INTRAVENOUS
  Filled 2020-04-22: qty 50

## 2020-04-22 MED ORDER — OXYCODONE HCL 5 MG PO TABS
5.0000 mg | ORAL_TABLET | ORAL | Status: DC | PRN
  Filled 2020-04-22: qty 2

## 2020-04-22 MED ORDER — ROCURONIUM BROMIDE 10 MG/ML (PF) SYRINGE
PREFILLED_SYRINGE | INTRAVENOUS | Status: AC
  Filled 2020-04-22: qty 10

## 2020-04-22 MED ORDER — PROMETHAZINE HCL 25 MG/ML IJ SOLN
6.2500 mg | Freq: Once | INTRAMUSCULAR | Status: AC
  Administered 2020-04-22: 6.25 mg via INTRAVENOUS

## 2020-04-22 MED ORDER — POTASSIUM CHLORIDE CRYS ER 20 MEQ PO TBCR: 20.0000 meq | EXTENDED_RELEASE_TABLET | Freq: Every day | ORAL | Status: DC | PRN

## 2020-04-22 MED ORDER — PHENYLEPHRINE HCL-NACL 10-0.9 MG/250ML-% IV SOLN
INTRAVENOUS | Status: DC | PRN
  Administered 2020-04-22: 30 ug/min via INTRAVENOUS

## 2020-04-22 MED ORDER — HEPARIN SODIUM (PORCINE) 5000 UNIT/ML IJ SOLN
5000.0000 [IU] | Freq: Three times a day (TID) | INTRAMUSCULAR | Status: DC
  Administered 2020-04-22 – 2020-04-26 (×12): 5000 [IU] via SUBCUTANEOUS
  Filled 2020-04-22 (×11): qty 1

## 2020-04-22 MED ORDER — AMISULPRIDE (ANTIEMETIC) 5 MG/2ML IV SOLN
10.0000 mg | Freq: Once | INTRAVENOUS | Status: AC | PRN
  Administered 2020-04-22: 10 mg via INTRAVENOUS

## 2020-04-22 MED ORDER — HEPARIN SODIUM (PORCINE) 1000 UNIT/ML IJ SOLN
INTRAMUSCULAR | Status: DC | PRN
  Administered 2020-04-22: 9000 [IU] via INTRAVENOUS
  Administered 2020-04-22: 2000 [IU] via INTRAVENOUS

## 2020-04-22 MED ORDER — DIAZEPAM 2 MG PO TABS
5.0000 mg | ORAL_TABLET | Freq: Every day | ORAL | Status: DC | PRN
  Administered 2020-04-22 – 2020-04-25 (×4): 5 mg via ORAL
  Filled 2020-04-22 (×4): qty 3

## 2020-04-22 MED ORDER — CHLORHEXIDINE GLUCONATE 0.12 % MT SOLN
15.0000 mL | Freq: Once | OROMUCOSAL | Status: AC
  Administered 2020-04-22: 15 mL via OROMUCOSAL
  Filled 2020-04-22: qty 15

## 2020-04-22 MED ORDER — FENTANYL CITRATE (PF) 250 MCG/5ML IJ SOLN
INTRAMUSCULAR | Status: AC
  Filled 2020-04-22: qty 5

## 2020-04-22 MED ORDER — BSS IO SOLN
15.0000 mL | Freq: Once | INTRAOCULAR | Status: AC
  Administered 2020-04-22: 15 mL
  Filled 2020-04-22: qty 15

## 2020-04-22 MED ORDER — HYDROMORPHONE HCL 1 MG/ML IJ SOLN: 0.5000 mg | INTRAMUSCULAR | Status: DC | PRN

## 2020-04-22 MED ORDER — NITROGLYCERIN 0.4 MG SL SUBL: 0.4000 mg | SUBLINGUAL_TABLET | SUBLINGUAL | Status: DC | PRN

## 2020-04-22 MED ORDER — MAGNESIUM SULFATE 2 GM/50ML IV SOLN: 2.0000 g | Freq: Every day | INTRAVENOUS | Status: DC | PRN

## 2020-04-22 MED ORDER — ORAL CARE MOUTH RINSE: 15.0000 mL | Freq: Once | OROMUCOSAL | Status: AC

## 2020-04-22 MED ORDER — FENTANYL CITRATE (PF) 100 MCG/2ML IJ SOLN: 25.0000 ug | INTRAMUSCULAR | Status: DC | PRN

## 2020-04-22 MED ORDER — INSULIN GLARGINE 100 UNIT/ML ~~LOC~~ SOLN
105.0000 [IU] | Freq: Every day | SUBCUTANEOUS | Status: DC
  Administered 2020-04-22 – 2020-04-24 (×2): 105 [IU] via SUBCUTANEOUS
  Administered 2020-04-25: 52.5 [IU] via SUBCUTANEOUS
  Filled 2020-04-22 (×5): qty 1.05

## 2020-04-22 MED ORDER — HYDRALAZINE HCL 20 MG/ML IJ SOLN: 5.0000 mg | INTRAMUSCULAR | Status: DC | PRN

## 2020-04-22 MED ORDER — ACETAMINOPHEN 10 MG/ML IV SOLN: 1000.0000 mg | Freq: Once | INTRAVENOUS | Status: DC | PRN

## 2020-04-22 MED ORDER — GUAIFENESIN 100 MG/5ML PO LIQD: 100.0000 mg | Freq: Three times a day (TID) | ORAL | Status: DC | PRN

## 2020-04-22 MED ORDER — POLYMYXIN B-TRIMETHOPRIM 10000-0.1 UNIT/ML-% OP SOLN
1.0000 [drp] | Freq: Three times a day (TID) | OPHTHALMIC | Status: AC
  Administered 2020-04-22 – 2020-04-23 (×3): 1 [drp] via OPHTHALMIC
  Filled 2020-04-22: qty 10

## 2020-04-22 MED ORDER — PROMETHAZINE HCL 25 MG/ML IJ SOLN
INTRAMUSCULAR | Status: AC
  Filled 2020-04-22: qty 1

## 2020-04-22 MED ORDER — INSULIN ASPART 100 UNIT/ML ~~LOC~~ SOLN: 0.0000 [IU] | SUBCUTANEOUS | Status: DC

## 2020-04-22 MED ORDER — ALUM & MAG HYDROXIDE-SIMETH 200-200-20 MG/5ML PO SUSP
15.0000 mL | ORAL | Status: DC | PRN
  Administered 2020-04-23: 30 mL via ORAL
  Filled 2020-04-22: qty 30

## 2020-04-22 MED ORDER — FLUTICASONE PROPIONATE 50 MCG/ACT NA SUSP
2.0000 | Freq: Every day | NASAL | Status: DC | PRN
  Filled 2020-04-22: qty 16

## 2020-04-22 MED ORDER — BISOPROLOL-HYDROCHLOROTHIAZIDE 5-6.25 MG PO TABS
2.0000 | ORAL_TABLET | Freq: Two times a day (BID) | ORAL | Status: DC
  Administered 2020-04-22 – 2020-04-26 (×8): 2 via ORAL
  Filled 2020-04-22 (×9): qty 2

## 2020-04-22 MED ORDER — LEVOTHYROXINE SODIUM 75 MCG PO TABS: 225.0000 ug | ORAL_TABLET | ORAL | Status: DC

## 2020-04-22 MED ORDER — ROCURONIUM BROMIDE 10 MG/ML (PF) SYRINGE
PREFILLED_SYRINGE | INTRAVENOUS | Status: DC | PRN
  Administered 2020-04-22: 50 mg via INTRAVENOUS
  Administered 2020-04-22 (×2): 20 mg via INTRAVENOUS

## 2020-04-22 SURGICAL SUPPLY — 40 items
CANISTER SUCT 3000ML PPV (MISCELLANEOUS) ×3 IMPLANT
CLIP VESOCCLUDE MED 24/CT (CLIP) ×3 IMPLANT
CLIP VESOCCLUDE SM WIDE 24/CT (CLIP) ×3 IMPLANT
COVER WAND RF STERILE (DRAPES) ×3 IMPLANT
DERMABOND ADVANCED (GAUZE/BANDAGES/DRESSINGS) ×2
DERMABOND ADVANCED .7 DNX12 (GAUZE/BANDAGES/DRESSINGS) ×1 IMPLANT
DRAIN CHANNEL 15F RND FF W/TCR (WOUND CARE) IMPLANT
ELECT REM PT RETURN 9FT ADLT (ELECTROSURGICAL) ×3
ELECTRODE REM PT RTRN 9FT ADLT (ELECTROSURGICAL) ×1 IMPLANT
EVACUATOR SILICONE 100CC (DRAIN) IMPLANT
GLOVE BIO SURGEON STRL SZ7.5 (GLOVE) ×3 IMPLANT
GLOVE BIOGEL PI IND STRL 8 (GLOVE) ×1 IMPLANT
GLOVE BIOGEL PI INDICATOR 8 (GLOVE) ×2
GOWN STRL REUS W/ TWL LRG LVL3 (GOWN DISPOSABLE) ×4 IMPLANT
GOWN STRL REUS W/ TWL XL LVL3 (GOWN DISPOSABLE) ×2 IMPLANT
GOWN STRL REUS W/TWL LRG LVL3 (GOWN DISPOSABLE) ×12
GOWN STRL REUS W/TWL XL LVL3 (GOWN DISPOSABLE) ×6
GRAFT VASC PATCH XENOSURE 1X14 (Vascular Products) ×3 IMPLANT
HEMOSTAT SNOW SURGICEL 2X4 (HEMOSTASIS) ×6 IMPLANT
HEMOSTAT SPONGE AVITENE ULTRA (HEMOSTASIS) IMPLANT
KIT BASIN OR (CUSTOM PROCEDURE TRAY) ×3 IMPLANT
KIT TURNOVER KIT B (KITS) ×3 IMPLANT
LOOP VESSEL MINI RED (MISCELLANEOUS) ×6 IMPLANT
NS IRRIG 1000ML POUR BTL (IV SOLUTION) ×6 IMPLANT
PACK PERIPHERAL VASCULAR (CUSTOM PROCEDURE TRAY) ×3 IMPLANT
PAD ARMBOARD 7.5X6 YLW CONV (MISCELLANEOUS) ×6 IMPLANT
SET WALTER ACTIVATION W/DRAPE (SET/KITS/TRAYS/PACK) ×3 IMPLANT
SUT MNCRL AB 4-0 PS2 18 (SUTURE) ×3 IMPLANT
SUT PROLENE 5 0 C 1 24 (SUTURE) ×24 IMPLANT
SUT PROLENE 6 0 BV (SUTURE) ×18 IMPLANT
SUT SILK 3 0SH CR/8 30 (SUTURE) ×3 IMPLANT
SUT VIC AB 2-0 CT1 27 (SUTURE) ×3
SUT VIC AB 2-0 CT1 36 (SUTURE) ×3 IMPLANT
SUT VIC AB 2-0 CT1 TAPERPNT 27 (SUTURE) ×1 IMPLANT
SUT VIC AB 3-0 SH 27 (SUTURE) ×6
SUT VIC AB 3-0 SH 27X BRD (SUTURE) ×2 IMPLANT
TOWEL GREEN STERILE (TOWEL DISPOSABLE) ×3 IMPLANT
TRAY FOLEY MTR SLVR 16FR STAT (SET/KITS/TRAYS/PACK) ×3 IMPLANT
UNDERPAD 30X36 HEAVY ABSORB (UNDERPADS AND DIAPERS) ×3 IMPLANT
WATER STERILE IRR 1000ML POUR (IV SOLUTION) ×3 IMPLANT

## 2020-04-22 NOTE — Anesthesia Procedure Notes (Signed)
Procedure Name: Intubation Date/Time: 04/22/2020 7:59 AM Performed by: Colon Flattery, CRNA Pre-anesthesia Checklist: Patient identified Patient Re-evaluated:Patient Re-evaluated prior to induction Oxygen Delivery Method: Circle system utilized Preoxygenation: Pre-oxygenation with 100% oxygen Induction Type: IV induction and Rapid sequence Ventilation: Unable to mask ventilate Laryngoscope Size: Glidescope and 4 Grade View: Grade I Tube type: Oral Tube size: 7.0 mm Number of attempts: 1 Airway Equipment and Method: Stylet,  Oral airway and Video-laryngoscopy Placement Confirmation: ETT inserted through vocal cords under direct vision,  positive ETCO2 and breath sounds checked- equal and bilateral Secured at: 21 cm Tube secured with: Tape Dental Injury: Teeth and Oropharynx as per pre-operative assessment

## 2020-04-22 NOTE — Anesthesia Preprocedure Evaluation (Addendum)
Anesthesia Evaluation  Patient identified by MRN, date of birth, ID band Patient awake    Reviewed: Allergy & Precautions, NPO status , Patient's Chart, lab work & pertinent test results  History of Anesthesia Complications (+) PONV and history of anesthetic complications  Airway Mallampati: IV  TM Distance: >3 FB Neck ROM: Full    Dental  (+) Edentulous Upper, Edentulous Lower   Pulmonary asthma , sleep apnea , COPD, Current Smoker,     + decreased breath sounds      Cardiovascular hypertension, + CAD, + Cardiac Stents and + Peripheral Vascular Disease  + Valvular Problems/Murmurs  Rhythm:Regular Rate:Normal     Neuro/Psych  Headaches, PSYCHIATRIC DISORDERS Anxiety  Neuromuscular disease    GI/Hepatic Neg liver ROS, GERD  ,  Endo/Other  diabetesHypothyroidism   Renal/GU negative Renal ROS     Musculoskeletal  (+) Arthritis , Fibromyalgia -  Abdominal Normal abdominal exam  (+)   Peds  Hematology negative hematology ROS (+)   Anesthesia Other Findings   Reproductive/Obstetrics                            Anesthesia Physical Anesthesia Plan  ASA: III  Anesthesia Plan: General   Post-op Pain Management:    Induction: Intravenous  PONV Risk Score and Plan: 4 or greater and Ondansetron, Dexamethasone, Midazolam and Scopolamine patch - Pre-op  Airway Management Planned: Oral ETT and Video Laryngoscope Planned  Additional Equipment: None  Intra-op Plan:   Post-operative Plan: Extubation in OR  Informed Consent: I have reviewed the patients History and Physical, chart, labs and discussed the procedure including the risks, benefits and alternatives for the proposed anesthesia with the patient or authorized representative who has indicated his/her understanding and acceptance.     Dental advisory given  Plan Discussed with: CRNA  Anesthesia Plan Comments: (Arterial line if surgeon  needs ACT's.  Gated Myocardial Study (2017):  The left ventricular ejection fraction is normal (55-65%).  There was no ST segment deviation noted during stress.  No T wave inversion was noted during stress.  Defect 1: There is a large defect of moderate severity.  Findings consistent with prior myocardial infarction with peri-infarct ischemia.  This is an intermediate risk study. )     Anesthesia Quick Evaluation

## 2020-04-22 NOTE — Op Note (Signed)
Date: April 22, 2020  Preoperative diagnosis: Right lower extremity short distance lifestyle limiting claudication  Postoperative diagnosis: Same  Procedure: Right femoral endarterectomy with profundoplasty and bovine pericardial patch angioplasty  Surgeon: Dr. Cephus Shelling, MD  Assistant: OR staff  Indications: Patient is a 64 year old female that was seen in consultation after arteriogram with cardiology that showed significant bilateral common femoral disease in setting of lifestyle limiting short distance claudication.  She stated that her right leg symptoms were worse.  She presents today for planned right femoral endarterectomy after risks and benefits discussed.  Findings: High-grade calcified plaque in the right common femoral artery that extended into the profunda requiring endarterectomy down to where the profunda branched and the patch was sewn on from the common femoral onto the profunda.  SFA was endarterectomized with eversion technique.  She has a short distal SFA occlusion.  DP and PT doppler signals in right foot at completion.  Anesthesia: General  EBL: 500 mL  Details: Patient was taken to the operating room after informed consent was obtained.  Placed on the operative table in supine position.  General endotracheal anesthesia was induced.  Subsequently used ultrasound to go ahead and identify her right common femoral artery that was marked on the skin.  Her right groin was then prepped and draped in usual sterile fashion.  She got preoperative antibiotics.  A timeout was performed to identify patient procedure and site.  Given she is obese we elected to perform a horizontal incision parallel to her inguinal crease just above this.  Dissection was carried down with Bovie cautery to open the subcutaneous tissue.  Ultimately we encountered the femoral sheath this was then opened longitudinally.  Cerebellar retractors were used for added visualization.  Ultimately the  SFA and profunda were dissected out.  I did ligated the profunda vein in order to get more mobilization on the profunda given that this was circumferentially calcified.  All the branches of the common femoral including epigastric branches were controlled with Vesseloops.  We did use a robotic retractor for added visualization under the inguinal ligament with a deavor.  Once we had dissect everything out patient was given 100 units per kilogram heparin.  We used Vesseloops on the SFA and profunda and a Henley clamp on the distal external iliac.  Common femoral was opened with 11 blade scalpel extended Potts scissors down to the profunda.  Ultimately endarterectomy was performed with [facility] and I did have to continue to open the arteriotomy down further on the profunda to where it branched in order to get to a nice endpoint.  SFA was endarterectomized with eversion technique.  This point time we then irrigated out the endarterectomy field and made sure there were no flaps.  We had good backbleeding from the profunda.  There was no backbleeding from the SFA given it was occluded distally.  A large bovine pericardial patch was then brought on the field and sewn parachute technique with 5-0 Prolene from the common femoral onto the profunda.  Once we were close to completing the patch we backbled and forward bleed everything and irrigated out the patch with heparinized saline.  I then finished the patch angioplasty.  When we came off clamps there was an area on the back of the common femoral near the bifurcation where this had to be repaired with pledgeted sutures given the endarterectomy had gotten thin here.  Once we had signals in the foot protamine was given for reversal.  We had  to place several 5-0 and 6-0 prolene repair sutures in the patch.  We irrigated out the wound.  Surgicel snow was used for hemostasis.  The femoral sheath was closed with running 2-0 Vicryl.  The subcutaneous tissue was closed with  3-0 Vicryl 4-0 Monocryl and Dermabond.  Taken to recovery in stable condition.  Complication: None  Condition: Stable  Cephus Shelling, MD [facility] Office: [phone]   Cephus Shelling

## 2020-04-22 NOTE — Transfer of Care (Signed)
Immediate Anesthesia Transfer of Care Note  Patient: Courtney Barker  Procedure(s) Performed: ENDARTERECTOMY FEMORAL RIGHT (Right Groin) PATCH ANGIOPLASTY (Right Groin) PROFUNDOPLASTY (Right Groin)  Patient Location: PACU  Anesthesia Type:General  Level of Consciousness: drowsy  Airway & Oxygen Therapy: Patient Spontanous Breathing and Patient connected to face mask oxygen  Post-op Assessment: Report given to RN, Post -op Vital signs reviewed and stable and Patient moving all extremities  Post vital signs: Reviewed and stable  Last Vitals:  Vitals Value Taken Time  BP 126/46 04/22/20 1136  Temp 36.6 C 04/22/20 1136  Pulse 76 04/22/20 1144  Resp 10 04/22/20 1144  SpO2 99 % 04/22/20 1144  Vitals shown include unvalidated device data.  Last Pain:  Vitals:   04/22/20 1136  TempSrc:   PainSc: Asleep      Patients Stated Pain Goal: 3 (04/22/20 1941)  Complications: No complications documented.

## 2020-04-22 NOTE — H&P (Signed)
History and Physical Interval Note:  04/22/2020 7:42 AM  [name]  has presented today for surgery, with the diagnosis of PAD.  The various methods of treatment have been discussed with the patient and family. After consideration of risks, benefits and other options for treatment, the patient has consented to  Procedure(s): ENDARTERECTOMY FEMORAL RIGHT (Right) as a surgical intervention.  The patient's history has been reviewed, patient examined, no change in status, stable for surgery.  I have reviewed the patient's chart and labs.  Questions were answered to the patient's satisfaction.    Right femoral endarterectomy  Cephus Shelling  Patient name: Courtney Barker      MRN: [id number]        DOB: June 17, 1956          Sex: female  REASON FOR VISIT: Follow-up to discuss bilateral femoral endarterectomies for lower extremity claudication  HPI: Courtney Barker is a 64 y.o. female with history of hypertension, hyperlipidemia, diabetes, obesity, COPD, coronary artery disease status post remote PCI presents for hospital follow-up to discuss femoral endarterectomy.  She was recently scheduled for surgery last month for right femoral endarterectomy.  On the morning of surgery she ultimately had a panic attack and her surgery had to be canceled as she did not feel she was in the right "head space."  She feels the right leg is worse than the left.  Still having claudication that she describes as short distance with burning in the calf.  She has since followed up with her PCP and states she has a prescription for diazepam for her next surgery date.  Vascular surgery was previously consulted for bilateral femoral artery endarterectomies in the setting of lower extremity lifestyle limiting claudication.  She had been under the care of Dr. Budd Palmer underwent left common femoral access  with aortogram lower extremity runoff. On arteriogram she had moderate bilateral iliac disease but no significant gradient  on pullback pressures. The right lower extremity showed severely stenosed right common femoral artery with a short distal SFA occlusion and three-vessel runoff. The left leg had a severe common femoral stenosis with patent three-vessel runoff.    Past Medical History:  Diagnosis Date  . Anxiety   . Arthritis    "head to toe; neck, hands, arms, knees, feet, ankles" (08/26/2015)  . Asthma   . Cervical spondylosis with myelopathy    s/p C5-6 and C6-7 Anterior cervical discectomy/decompression; C5-6 and C6-7 interbody arthrodesis with local morcellized autograft bone and Actifuse bone graft extender; insertion of interbody prosthesis at C5-6 and C6-7 (Zimmer peek interbody prosthesis); anterior cervical plating from C5-6 and C6-7 with globus titanium plate 07/25/76  . Chronic lower back pain   . COPD (chronic obstructive pulmonary disease) (HCC)   . Coronary artery disease    s/p BMS x3 RCA '02; repeat cath in '02 and '05 were without significant ISR and normal LV function; pt reported unremarkable nuclear myoview 2011/12  . Depression   . Fibromyalgia   . GERD (gastroesophageal reflux disease)   . Headache(784.0)    "2-3 times/week" (08/26/2015)  . Heart murmur   . Hyperlipidemia    statin intolerant, to be followed by lipid clinic  . Hypertension   . Hypothyroidism   . Increased urinary protein excretion   . Myocardial infarction [facility]) 12/2013   "[place]"  . Neuromuscular disorder (HCC)   . PONV (postoperative nausea and vomiting)   . Sleep apnea    NO STUDY DONE ...   [name]  Thyroid nodule   . Tobacco abuse   . Type II diabetes mellitus (HCC)          Past Surgical History:  Procedure Laterality Date  . ABDOMINAL AORTOGRAM W/LOWER EXTREMITY N/A 11/25/2019   Procedure: ABDOMINAL AORTOGRAM W/LOWER EXTREMITY;  Surgeon: Iran Ouch, MD;  Location: [facility] INVASIVE CV LAB;  Service: Cardiovascular;  Laterality: N/A;  . ABDOMINAL HYSTERECTOMY  2000    "fibroids; took 1 ovary"  . ANTERIOR CERVICAL DECOMP/DISCECTOMY FUSION  08/23/2011   Procedure: ANTERIOR CERVICAL DECOMPRESSION/DISCECTOMY FUSION 2 LEVELS;  Surgeon: Cristi Loron, MD;  Location: [facility] NEURO ORS;  Service: Neurosurgery;  Laterality: N/A;  Anterior Cervical Five-Six/Six-Seven Decompression with Fusion with Interbody Prothesis, Plating, and Bonegraft  . BACK SURGERY    . BOWEL RESECTION  2007   . CARDIAC CATHETERIZATION N/A 08/26/2015   Procedure: Coronary Stent Intervention;  Surgeon: Peter M Swazil[place], MD;  Location: [facility] INVASIVE CV LAB;  Service: Cardiovascular;  Laterality: N/A;  OM3 and circ  . CARDIAC CATHETERIZATION  2002; 2005   patent stents  . CARDIAC CATHETERIZATION  12/2013   CTO of the RCA ; moderate disease noted in the third OM and mild disease in the LAD; treated medically  . COLONOSCOPY W/ POLYPECTOMY    . CORONARY ANGIOPLASTY WITH STENT PLACEMENT  2002   s/p BMS x3 RCA   . MULTIPLE TOOTH EXTRACTIONS  2000  . TONSILLECTOMY    . TUBAL LIGATION  1981         Family History  Problem Relation Age of Onset  . Anesthesia problems Mother   . Anesthesia problems Sister   . Neuropathy Father   . Heart attack Father        Cause of death  . Neuropathy Sister   . Diabetes Sister   . Heart Problems Sister   . Diabetes Maternal Grandfather   . Lupus Sister     SOCIAL HISTORY: Social History        Tobacco Use  . Smoking status: Current Every Day Smoker    Packs/day: 1.00    Years: 43.00    Pack years: 43.00    Types: Cigarettes  . Smokeless tobacco: Never Used  Substance Use Topics  . Alcohol use: No    Alcohol/week: 0.0 standard drinks         Allergies  Allergen Reactions  . Candesartan Cilexetil Shortness Of Breath  . Clonidine Derivatives Shortness Of Breath and Swelling  . Lisinopril Shortness Of Breath and Swelling  . Losartan Palpitations  . Penicillins Anaphylaxis  . Plavix [Clopidogrel  Bisulfate] Itching  . Quinapril Hcl Shortness Of Breath and Swelling  . Repaglinide Palpitations  . Toprol Xl [Metoprolol Tartrate] Shortness Of Breath and Swelling  . Trovan [Alatrofloxacin] Shortness Of Breath and Swelling  . Atorvastatin Other (See Comments)    myalgias  . Clarithromycin Nausea And Vomiting  . Ezetimibe Other (See Comments)    GI bleeding  . Hydromorphone Hcl Nausea And Vomiting  . Metoclopramide Hcl Other (See Comments)    Reaction unknown  . Morphine And Related Nausea And Vomiting  . Oxycodone-Acetaminophen Other (See Comments)    Body feels like bee stings  . Paroxetine Hcl Other (See Comments)    Freezing, burning from inside out.  . Pioglitazone Swelling and Other (See Comments)  . Valsartan Palpitations  . Vancomycin Other (See Comments)    Reaction unknown          Current Outpatient Medications  Medication Sig Dispense Refill  .  acetaminophen (TYLENOL) 500 MG tablet Take 1,000 mg by mouth every 6 (six) hours as needed (pain).     [name] albuterol (PROVENTIL HFA;VENTOLIN HFA) 108 (90 BASE) MCG/ACT inhaler Inhale 2 puffs into the lungs every 6 (six) hours as needed for wheezing or shortness of breath.    [name] aspirin 81 MG tablet Take 81 mg by mouth daily.    . bisacodyl (DULCOLAX) 5 MG EC tablet Take 5 mg by mouth daily as needed for mild constipation.     . bisoprolol-hydrochlorothiazide (ZIAC) 5-6.25 MG tablet Take 1 tablet by mouth 2 (two) times daily. (Patient taking differently: Take 2 tablets by mouth 2 (two) times daily. )    . fexofenadine (ALLEGRA) 60 MG tablet Take 60 mg by mouth 2 (two) times daily as needed for allergies or rhinitis.    . fluticasone (FLONASE) 50 MCG/ACT nasal spray Place 2 sprays into the nose daily as needed for allergies.     . furosemide (LASIX) 40 MG tablet Take 40 mg by mouth daily.     . indomethacin (INDOCIN) 25 MG capsule Take 25 mg by mouth daily as needed for mild pain.    [name] LANTUS 100  UNIT/ML injection Inject 155 Units into the skin at bedtime.   5  . magnesium oxide (MAG-OX) 400 MG tablet Take 200 mg by mouth daily.     . metFORMIN (GLUCOPHAGE-XR) 500 MG 24 hr tablet Take 2 tablets (1,000 mg total) by mouth 2 (two) times daily. (Patient taking differently: Take 500 mg by mouth in the morning and at bedtime. )    . Neomy-Bacit-Polymyx-Pramoxine (NEOSPORIN + PAIN RELIEF MAX ST) 1 % OINT Apply 1 application topically daily as needed (wound care).    . nitroGLYCERIN (NITROSTAT) 0.4 MG SL tablet DISSOLVE 1 TABLET UNDER THE TONGUE EVERY 5 MINUTES UP TO 3 DOSES (Patient taking differently: Place 0.4 mg under the tongue every 5 (five) minutes as needed for chest pain. ) 25 tablet 6  . NOVOLOG 100 UNIT/ML injection Inject 50-60 Units into the skin See admin instructions. INJECT 50-55 UNITS INTO THE SKIN WITH BREAKFAST, INJECT 55-60 UNITS INTO THE SKIN AT LUNCH TIME, INJECT 60 UNITS INTO THE SKIN WITH SUPPER. ANY TIME YOUR BG ARE GREATER THAN 150, INJECT INTO THE SKIN AS DIRECTED PER SLIDING SCALE  5  . polyethylene glycol (MIRALAX / GLYCOLAX) packet Take 17 g by mouth daily as needed for moderate constipation.     . potassium chloride SA (K-DUR,KLOR-CON) 20 MEQ tablet Take 10 mEq by mouth daily.     . RABEprazole (ACIPHEX) 20 MG tablet Take 20 mg by mouth daily.    . Simethicone (GAS-X PO) Take 1-2 tablets by mouth 2 (two) times daily as needed (gas).    . SYNTHROID 150 MCG tablet Take 150-225 mcg by mouth See admin instructions. Take 150 mcg daily except on Sundays take 225 mcg (Brand Only)  5  . Vitamin D, Ergocalciferol, (DRISDOL) 50000 UNITS CAPS Take 50,000 Units by mouth every Sunday.      No current facility-administered medications for this visit.    REVIEW OF SYSTEMS:  [X]  denotes positive finding, [ ]  denotes negative finding Cardiac  Comments:  Chest pain or chest pressure:    Shortness of breath upon exertion:    Short of breath when lying flat:     Irregular heart rhythm:        Vascular    Pain in calf, thigh, or hip brought on by ambulation:  Pain in feet at night that wakes you up from your sleep:     Blood clot in your veins:    Leg swelling:         Pulmonary    Oxygen at home:    Productive cough:     Wheezing:         Neurologic    Sudden weakness in arms or legs:     Sudden numbness in arms or legs:     Sudden onset of difficulty speaking or slurred speech:    Temporary loss of vision in one eye:     Problems with dizziness:         Gastrointestinal    Blood in stool:     Vomited blood:         Genitourinary    Burning when urinating:     Blood in urine:        Psychiatric    Major depression:         Hematologic    Bleeding problems:    Problems with blood clotting too easily:        Skin    Rashes or ulcers:        Constitutional    Fever or chills:      PHYSICAL EXAM:    Vitals:   02/16/20 1512  BP: (!) 166/73  Pulse: (!) 53  Resp: 16  Temp: (!) 97.3 F (36.3 C)  TempSrc: Temporal  SpO2: 98%  Weight: 191 lb (86.6 kg)  Height: 5' (1.524 m)    GENERAL: The patient is a well-nourished female, in no acute distress. The vital signs are documented above. CARDIAC: There is a regular rate and rhythm.  VASCULAR:  Weak femoral pulses bilaterally PULMONARY: There is good air exchange bilaterally without wheezing or rales. ABDOMEN: Soft and non-tender with normal pitched bowel sounds.  MUSCULOSKELETAL: There are no major deformities or cyanosis. NEUROLOGIC: No focal weakness or paresthesias are detected. SKIN: There are no ulcers or rashes noted. PSYCHIATRIC: The patient has a normal affect.  DATA:   Previous arteriogram shows an 80% right common femoral stenosis with right SFA short chronic total occlusion.  The left common femoral is heavily calcified and subtotally occluded with near 95%  stenosis.  Assessment/Plan:  64 year old female with multiple medical comorbidities as documented above that presents to discuss previously planned right femoral endarterectomy.  She was scheduled for surgery last month and ultimately had a panic attack in holding and her surgery had to be canceled.  I had her follow-up today after seeing her PCP to discuss next steps.  She states that she is in a better head space and now has a prescription for diazepam to take on the way to the hospital.  She still has lifestyle limiting claudication worse in the right leg and discussed that with the Covid pandemic and bed shortage we have been delaying any interventional on claudication at this time given this is not a limb threatening situation until the Covid pandemic improves.  We will get her scheduled for the second week in October at her request for right femoral endarterectomy.  Discussed some risk we may have to delay her surgery further pending the pandemic at that time.   Cephus Shelling, MD [facility] [place] Office: [phone]

## 2020-04-22 NOTE — Anesthesia Postprocedure Evaluation (Signed)
Anesthesia Post Note  Patient: Courtney Barker  Procedure(s) Performed: ENDARTERECTOMY FEMORAL RIGHT (Right Groin) PATCH ANGIOPLASTY (Right Groin) PROFUNDOPLASTY (Right Groin)     Patient location during evaluation: PACU Anesthesia Type: General Level of consciousness: awake and alert Pain management: pain level controlled Vital Signs Assessment: post-procedure vital signs reviewed and stable Respiratory status: spontaneous breathing, nonlabored ventilation, respiratory function stable and patient connected to nasal cannula oxygen Cardiovascular status: blood pressure returned to baseline and stable Postop Assessment: no apparent nausea or vomiting Anesthetic complications: no   No complications documented.  Last Vitals:  Vitals:   04/22/20 1430 04/22/20 1439  BP:  (!) 113/58  Pulse: (!) 52 (!) 54  Resp: 15 16  Temp: 36.6 C 36.7 C  SpO2: 97% 98%    Last Pain:  Vitals:   04/22/20 1439  TempSrc: Oral  PainSc: 0-No pain                 Shelton Silvas

## 2020-04-22 NOTE — Progress Notes (Signed)
Patient hasn't voided since 11:30 am, bladder scanned patient 109 ml.  Patient also complains of left and right sided eye pain burning.  Per Dr. Randie Heinz, MD eye drops used for Rt eye may also be given in left eye.  Per Dr. Randie Heinz, MD if patient fails to void and bladder scan >250 ml place Foley.

## 2020-04-23 LAB — CBC
HCT: 30.6 % — ABNORMAL LOW (ref 36.0–46.0)
Hemoglobin: 10.8 g/dL — ABNORMAL LOW (ref 12.0–15.0)
MCH: 33.3 pg (ref 26.0–34.0)
MCHC: 35.3 g/dL (ref 30.0–36.0)
MCV: 94.4 fL (ref 80.0–100.0)
Platelets: 143 10*3/uL — ABNORMAL LOW (ref 150–400)
RBC: 3.24 MIL/uL — ABNORMAL LOW (ref 3.87–5.11)
RDW: 12.1 % (ref 11.5–15.5)
WBC: 7.8 10*3/uL (ref 4.0–10.5)
nRBC: 0 % (ref 0.0–0.2)

## 2020-04-23 LAB — BASIC METABOLIC PANEL
Anion gap: 10 (ref 5–15)
BUN: 12 mg/dL (ref 8–23)
CO2: 28 mmol/L (ref 22–32)
Calcium: 8 mg/dL — ABNORMAL LOW (ref 8.9–10.3)
Chloride: 98 mmol/L (ref 98–111)
Creatinine, Ser: 1.02 mg/dL — ABNORMAL HIGH (ref 0.44–1.00)
GFR, Estimated: >60 mL/min (ref >60)
Glucose, Bld: 178 mg/dL — ABNORMAL HIGH (ref 70–99)
Potassium: 3.7 mmol/L (ref 3.5–5.1)
Sodium: 136 mmol/L (ref 135–145)

## 2020-04-23 LAB — HEMOGLOBIN A1C
Hgb A1c MFr Bld: 7.4 % — ABNORMAL HIGH (ref 4.8–5.6)
Mean Plasma Glucose: 165.68 mg/dL

## 2020-04-23 LAB — LIPID PANEL
Cholesterol: 227 mg/dL — ABNORMAL HIGH (ref 0–200)
HDL: 29 mg/dL — ABNORMAL LOW (ref >40)
LDL Cholesterol: 139 mg/dL — ABNORMAL HIGH (ref 0–99)
Total CHOL/HDL Ratio: 7.8 RATIO
Triglycerides: 293 mg/dL — ABNORMAL HIGH (ref <150)
VLDL: 59 mg/dL — ABNORMAL HIGH (ref 0–40)

## 2020-04-23 LAB — GLUCOSE, CAPILLARY
Glucose-Capillary: 106 mg/dL — ABNORMAL HIGH (ref 70–99)
Glucose-Capillary: 116 mg/dL — ABNORMAL HIGH (ref 70–99)
Glucose-Capillary: 155 mg/dL — ABNORMAL HIGH (ref 70–99)
Glucose-Capillary: 210 mg/dL — ABNORMAL HIGH (ref 70–99)
Glucose-Capillary: 287 mg/dL — ABNORMAL HIGH (ref 70–99)

## 2020-04-23 MED ORDER — SIMETHICONE 80 MG PO CHEW
80.0000 mg | CHEWABLE_TABLET | Freq: Four times a day (QID) | ORAL | Status: DC | PRN
  Filled 2020-04-23: qty 1

## 2020-04-23 MED ORDER — INSULIN GLARGINE 100 UNIT/ML ~~LOC~~ SOLN
52.5000 [IU] | Freq: Once | SUBCUTANEOUS | Status: AC
  Administered 2020-04-23: 52.5 [IU] via SUBCUTANEOUS
  Filled 2020-04-23: qty 0.53

## 2020-04-23 MED ORDER — TAMSULOSIN HCL 0.4 MG PO CAPS
0.4000 mg | ORAL_CAPSULE | Freq: Every day | ORAL | Status: DC
  Administered 2020-04-23 – 2020-04-26 (×4): 0.4 mg via ORAL
  Filled 2020-04-23 (×4): qty 1

## 2020-04-23 MED ORDER — TRAMADOL HCL 50 MG PO TABS
50.0000 mg | ORAL_TABLET | Freq: Four times a day (QID) | ORAL | Status: DC
  Administered 2020-04-23 – 2020-04-26 (×14): 50 mg via ORAL
  Filled 2020-04-23 (×14): qty 1

## 2020-04-23 MED ORDER — LEVOTHYROXINE SODIUM 150 MCG PO TABS
225.0000 ug | ORAL_TABLET | ORAL | Status: DC
  Administered 2020-04-24: 225 ug via ORAL
  Filled 2020-04-23 (×6): qty 1.5

## 2020-04-23 MED ORDER — RABEPRAZOLE SODIUM 20 MG PO TBEC
20.0000 mg | DELAYED_RELEASE_TABLET | Freq: Every day | ORAL | Status: DC
  Filled 2020-04-23: qty 1

## 2020-04-23 MED ORDER — RABEPRAZOLE SODIUM 20 MG PO TBEC
20.0000 mg | DELAYED_RELEASE_TABLET | Freq: Every day | ORAL | Status: DC
  Administered 2020-04-23 – 2020-04-25 (×3): 20 mg via ORAL
  Filled 2020-04-23 (×4): qty 1

## 2020-04-23 MED ORDER — ACETAMINOPHEN 325 MG PO TABS
650.0000 mg | ORAL_TABLET | Freq: Once | ORAL | Status: AC
  Administered 2020-04-23: 650 mg via ORAL
  Filled 2020-04-23: qty 2

## 2020-04-23 MED ORDER — ACETAMINOPHEN 325 MG PO TABS
650.0000 mg | ORAL_TABLET | Freq: Four times a day (QID) | ORAL | Status: DC | PRN
  Administered 2020-04-23 – 2020-04-26 (×4): 650 mg via ORAL
  Filled 2020-04-23 (×4): qty 2

## 2020-04-23 MED ORDER — NON FORMULARY: 20.0000 mg | Freq: Every evening | Status: DC

## 2020-04-23 MED ORDER — LEVOTHYROXINE SODIUM 150 MCG PO TABS
150.0000 ug | ORAL_TABLET | ORAL | Status: DC
  Administered 2020-04-25 – 2020-04-26 (×2): 150 ug via ORAL
  Filled 2020-04-23 (×2): qty 1

## 2020-04-23 MED ORDER — FUROSEMIDE 20 MG PO TABS
20.0000 mg | ORAL_TABLET | Freq: Once | ORAL | Status: AC
  Administered 2020-04-23: 20 mg via ORAL
  Filled 2020-04-23: qty 1

## 2020-04-23 MED ORDER — SIMETHICONE 125 MG PO CAPS
125.0000 mg | ORAL_CAPSULE | Freq: Four times a day (QID) | ORAL | Status: DC | PRN
  Administered 2020-04-23 – 2020-04-25 (×2): 125 mg via ORAL
  Filled 2020-04-23 (×2): qty 1

## 2020-04-23 NOTE — Progress Notes (Addendum)
  Progress Note    04/23/2020 8:48 AM 1 Day Post-Op  Subjective:  Right leg feeling heavy and hard to move. Right knee pain. Incisional pain moderate she says   Vitals:   04/23/20 0004 04/23/20 0740  BP: 95/70 127/67  Pulse: (!) 53 (!) 57  Resp: 19 18  Temp: 98.1 F (36.7 C) 98.7 F (37.1 C)  SpO2: 97% 97%   Physical Exam: Cardiac: regular rate and rhythm Lungs: non labored Incisions: right groin incision clean, dry and intact. No swelling or hematoma Extremities:  Well perfused and warm. Doppler PT/ Dp signals Abdomen:  Obese, soft non distended Neurologic: alert and oriented  CBC    Component Value Date/Time   WBC 7.8 04/23/2020 0223   RBC 3.24 (L) 04/23/2020 0223   HGB 10.8 (L) 04/23/2020 0223   HGB 15.2 04/08/2019 1123   HCT 30.6 (L) 04/23/2020 0223   HCT 42.8 04/08/2019 1123   PLT 143 (L) 04/23/2020 0223   PLT 202 04/08/2019 1123   MCV 94.4 04/23/2020 0223   MCV 96 04/08/2019 1123   MCH 33.3 04/23/2020 0223   MCHC 35.3 04/23/2020 0223   RDW 12.1 04/23/2020 0223   RDW 12.7 04/08/2019 1123   LYMPHSABS 2.7 03/31/2018 1004   EOSABS 0.2 03/31/2018 1004   BASOSABS 0.1 03/31/2018 1004    BMET    Component Value Date/Time   NA 136 04/23/2020 0223   NA 135 04/08/2019 1123   K 3.7 04/23/2020 0223   CL 98 04/23/2020 0223   CO2 28 04/23/2020 0223   GLUCOSE 178 (H) 04/23/2020 0223   GLUCOSE 202 (H) 04/08/2006 1520   BUN 12 04/23/2020 0223   BUN 10 04/08/2019 1123   CREATININE 1.02 (H) 04/23/2020 0223   CREATININE 0.73 04/26/2016 1001   CALCIUM 8.0 (L) 04/23/2020 0223   GFRNONAA >60 04/23/2020 0223   GFRAA >60 01/11/2020 1605    INR    Component Value Date/Time   INR 1.0 04/22/2020 0706     Intake/Output Summary (Last 24 hours) at 04/23/2020 0848 Last data filed at 04/23/2020 0152 Gross per 24 hour  Intake 2918.08 ml  Output 950 ml  Net 1968.08 ml     Assessment/Plan:  64 y.o. female is s/p right femoral endarterectomy with profundoplasty and  bovine pericardial patch angioplasty 1 Day Post-Op . RLE well perfused and warm. Doppler DP/ PT signals bilaterally. Hemodynamically stable. Dry gauze to right groin incision. Pain control with Tramadol. I have ordered Flomax for urinary retention.  Encourage oob and mobilizaiton  DVT prophylaxis:  Sq. heparin   Graceann Congress, PA-C [facility] [phone] 04/23/2020 8:48 AM   I have independently interviewed and examined patient and agree with PA assessment and plan above.  She has several complaints today most of which do not involve her recent surgery.  She has chronic knee pain which has been exacerbated by this hospitalization.  She also states that she cannot take generic Synthroid and that she needs her eyedrops.  She is very concerned about having her diazepam when needed and this has been ordered already.  She does need to be out of bed and moving and we will need meticulous groin wound care given her risk of breakdown.  Myiah Petkus C. Randie Heinz, MD [facility] [place] Office: [phone] Pager: [phone]

## 2020-04-23 NOTE — Progress Notes (Signed)
Patient is continuing to have difficulty voiding today, with complaint of having to apply strong pressure to void and going several hours between voiding.  Patient denies pain with voiding or urgency, urine is clear.  Urine output adequate for shift.  Paged Graceann Congress, PA to make aware.  Advised to monitor signs and symptoms overnight.

## 2020-04-23 NOTE — Evaluation (Signed)
Physical Therapy Evaluation Patient Details Name: Courtney Barker MRN: [id number] DOB: 1956/01/05 Today's Date: 04/23/2020   History of Present Illness  64 yo female with PAD was admitted for R femoral endarterectomy with patch angioplasty, due to severely stenosed right common femoral artery with a short distal SFA occlusion and three-vessel runoff.  PMHx:  HLD, HTN, COPD, CAD, remote PCI, DM, obesity, anxiety, OA, asthma, cervical spondylosis and myelopathy, fibromyalgia, heart murmur, MI,   Clinical Impression  Pt was assisted up to side of bed, but pain has been severe.  Helped her to transfer with steps on RW to [facility], then back to bed with repositioning to increase comfort.  Meds were requested, and hopefully will be able to get into habit of being up for [facility] instead of [place].  Follow acutely for needs of mobiltiy to get home from CIR in a timely way, with safety being the focus.    Follow Up Recommendations CIR    Equipment Recommendations  None recommended by PT    Recommendations for Other Services Rehab consult     Precautions / Restrictions Precautions Precautions: Fall Restrictions Weight Bearing Restrictions: No      Mobility  Bed Mobility Overal bed mobility: Needs Assistance Bed Mobility: Supine to Sit;Sit to Supine     Supine to sit: Min guard Sit to supine: Min guard   General bed mobility comments: HOB up    Transfers Overall transfer level: Needs assistance Equipment used: Rolling walker (2 wheeled) Transfers: Sit to/from [employer] Sit to Stand: Min assist Stand pivot transfers: Min assist       General transfer comment: Min assist to stand and pivot to [facility]  Ambulation/Gait Ambulation/Gait assistance: Min guard Gait Distance (Feet): 4 Feet Assistive device: Rolling walker (2 wheeled);1 person hand held assist Gait Pattern/deviations: Step-to pattern;Wide base of support;Decreased weight shift to right Gait velocity: reduced Gait  velocity interpretation: <1.31 ft/sec, indicative of household ambulator General Gait Details: pt is in severe pain and requiring help to maneuver to [facility], help to get legs to bed  Stairs            Wheelchair Mobility    Modified Rankin (Stroke Patients Only)       Balance Overall balance assessment: Needs assistance Sitting-balance support: Feet supported Sitting balance-Leahy Scale: Good     Standing balance support: Bilateral upper extremity supported;During functional activity Standing balance-Leahy Scale: Poor                               Pertinent Vitals/Pain Pain Assessment: 0-10 Pain Score:  (12) Faces Pain Scale: Hurts even more Pain Location: R knee Pain Descriptors / Indicators: Aching;Grimacing;Guarding;Operative site guarding Pain Intervention(s): Limited activity within patient's tolerance;Monitored during session;Premedicated before session;Repositioned;Patient requesting pain meds-RN notified    Home Living Family/patient expects to be discharged to:: Private residence Living Arrangements: Spouse/significant other;Children Available Help at Discharge: Family;Available 24 hours/day Type of Home: Mobile home Home Access: Stairs to enter Entrance Stairs-Rails: Can reach both Entrance Stairs-Number of Steps: 5 Home Layout: One level Home Equipment: Emergency planning/management officer - 2 wheels;Cane - single point;Toilet riser      Prior Function Level of Independence: Needs assistance   Gait / Transfers Assistance Needed: walks with a cane  ADL's / Homemaking Assistance Needed: assisted for IADL, sits to shower        Hand Dominance   Dominant Hand: Right    Extremity/Trunk Assessment  Upper Extremity Assessment Upper Extremity Assessment: Defer to OT evaluation RUE Deficits / Details: longstanding shoulder limitations RUE Coordination: decreased gross motor    Lower Extremity Assessment Lower Extremity Assessment: RLE  deficits/detail RLE Deficits / Details: 3+ strength RLE: Unable to fully assess due to pain RLE Coordination: decreased gross motor    Cervical / Trunk Assessment Cervical / Trunk Assessment: Other exceptions Cervical / Trunk Exceptions: increased body habitus  Communication   Communication: No difficulties  Cognition Arousal/Alertness: Awake/alert Behavior During Therapy: WFL for tasks assessed/performed Overall Cognitive Status: Within Functional Limits for tasks assessed                                        General Comments General comments (skin integrity, edema, etc.): Pt is up to side of bed and backw ith help due to pain and lack of location for her symptoms, talking about needing a joint injection    Exercises     Assessment/Plan    PT Assessment Patient needs continued PT services  PT Problem List Decreased strength;Decreased range of motion;Decreased activity tolerance;Decreased balance;Decreased mobility;Decreased coordination;Decreased knowledge of use of DME;Decreased safety awareness;Cardiopulmonary status limiting activity;Obesity;Decreased skin integrity;Pain       PT Treatment Interventions DME instruction;Gait training;Stair training;Functional mobility training;Therapeutic activities;Therapeutic exercise;Balance training;Neuromuscular re-education;Patient/family education    PT Goals (Current goals can be found in the Care Plan section)  Acute Rehab PT Goals Patient Stated Goal: home with family PT Goal Formulation: With patient Time For Goal Achievement: 04/30/20 Potential to Achieve Goals: Good    Frequency Min 3X/week   Barriers to discharge Inaccessible home environment;Decreased caregiver support home with 4-5 steps to get in and some family help    Co-evaluation               AM-PAC PT "6 Clicks" Mobility  Outcome Measure Help needed turning from your back to your side while in a flat bed without using bedrails?: A  Little Help needed moving from lying on your back to sitting on the side of a flat bed without using bedrails?: A Little Help needed moving to and from a bed to a chair (including a wheelchair)?: A Lot Help needed standing up from a chair using your arms (e.g., wheelchair or bedside chair)?: A Lot Help needed to walk in hospital room?: A Lot Help needed climbing 3-5 steps with a railing? : Total 6 Click Score: 13    End of Session Equipment Utilized During Treatment: Gait belt Activity Tolerance: Patient limited by fatigue;Treatment limited secondary to medical complications (Comment);Patient limited by pain Patient left: in bed;with call bell/phone within reach;with bed alarm set Nurse Communication: Mobility status PT Visit Diagnosis: Unsteadiness on feet (R26.81);Muscle weakness (generalized) (M62.81);Pain Pain - Right/Left: Right Pain - part of body: Knee;Leg    Time: 1023-1050 PT Time Calculation (min) (ACUTE ONLY): 27 min   Charges:   PT Evaluation $PT Eval Moderate Complexity: 1 Mod PT Treatments $Therapeutic Activity: 8-22 mins       Ivar Drape 04/23/2020, 4:21 PM  Samul Dada, PT MS Acute Rehab Dept. Number: [facility] [id number] and [facility] [phone]

## 2020-04-23 NOTE — Progress Notes (Signed)
Inpatient Rehab Admissions Coordinator Note:   Per PT recommendation, pt was screened for CIR candidacy by Wolfgang Phoenix, MS, CCC-SLP.  At this time we are not recommending an inpatient rehab consult.  Findings noted in PT evaluation indicate that pt may not require CIR level therapy at time of discharge.  Will continue to follow pt's progress with therapies and medical workup from distance. Please contact me with questions.    Wolfgang Phoenix, MS, CCC-SLP Admissions Coordinator [phone] 04/23/20 5:12 PM

## 2020-04-23 NOTE — Evaluation (Signed)
Occupational Therapy Evaluation Patient Details Name: Courtney Barker MRN: [id number] DOB: 10-19-1955 Today's Date: 04/23/2020    History of Present Illness 64 yo female with PAD was admitted for R femoral endarterectomy with patch angioplasty, due to severely stenosed right common femoral artery with a short distal SFA occlusion and three-vessel runoff.  PMHx:  HLD, HTN, COPD, CAD, remote PCI, DM, obesity, anxiety, OA, asthma, cervical spondylosis and myelopathy, fibromyalgia, heart murmur, MI,    Clinical Impression   Pt ambulates with a cane and is assisted for IADL, but modified independent in ADL at her baseline. Pt presents with R knee pain and impaired standing balance. She transferred OOB with RW and min guard assist using momentum to stand. Pt needs up to max assist for ADL, specifically LB bathing and dressing. Will follow acutely. Anticipate pt will progress well and not require follow up OT upon discharge.    Follow Up Recommendations  No OT follow up    Equipment Recommendations  None recommended by OT    Recommendations for Other Services       Precautions / Restrictions Precautions Precautions: Fall      Mobility Bed Mobility Overal bed mobility: Modified Independent             General bed mobility comments: HOB up    Transfers Overall transfer level: Needs assistance Equipment used: Rolling walker (2 wheeled) Transfers: Sit to/from [employer] Sit to Stand: Min guard;From elevated surface Stand pivot transfers: Min guard       General transfer comment: use of momentum to stand    Balance Overall balance assessment: Needs assistance   Sitting balance-Leahy Scale: Good     Standing balance support: Bilateral upper extremity supported Standing balance-Leahy Scale: Poor                             ADL either performed or assessed with clinical judgement   ADL Overall ADL's : Needs assistance/impaired Eating/Feeding:  Independent   Grooming: Set up;Sitting   Upper Body Bathing: Set up;Sitting   Lower Body Bathing: Maximal assistance;Sit to/from stand   Upper Body Dressing : Set up;Sitting   Lower Body Dressing: Maximal assistance;Sit to/from stand   Toilet Transfer: Min guard;Stand-pivot;RW;BSC   Toileting- Architect and Hygiene: Minimal assistance;Sit to/from stand               Vision Patient Visual Report: No change from baseline       Perception     Praxis      Pertinent Vitals/Pain Pain Assessment: Faces Faces Pain Scale: Hurts even more Pain Location: R knee Pain Descriptors / Indicators: Aching;Grimacing;Guarding Pain Intervention(s): Monitored during session;Repositioned;Ice applied     Hand Dominance Right   Extremity/Trunk Assessment Upper Extremity Assessment Upper Extremity Assessment: RUE deficits/detail RUE Deficits / Details: longstanding shoulder limitations RUE Coordination: decreased gross motor   Lower Extremity Assessment Lower Extremity Assessment: Defer to PT evaluation   Cervical / Trunk Assessment Cervical / Trunk Assessment: Other exceptions Cervical / Trunk Exceptions: increased body habitus   Communication Communication Communication: No difficulties   Cognition Arousal/Alertness: Awake/alert Behavior During Therapy: WFL for tasks assessed/performed Overall Cognitive Status: Within Functional Limits for tasks assessed                                     General Comments  Exercises     Shoulder Instructions      Home Living Family/patient expects to be discharged to:: Private residence Living Arrangements: Spouse/significant other;Children (adult son) Available Help at Discharge: Family;Available 24 hours/day Type of Home: Mobile home Home Access: Stairs to enter Entrance Stairs-Number of Steps: 5   Home Layout: One level     Bathroom Shower/Tub: Chief Strategy Officer: Standard      Home Equipment: Emergency planning/management officer - 2 wheels;Cane - single point;Toilet riser          Prior Functioning/Environment Level of Independence: Needs assistance  Gait / Transfers Assistance Needed: walks with a cane ADL's / Homemaking Assistance Needed: assisted for IADL, sits to shower            OT Problem List: Decreased strength;Decreased activity tolerance;Impaired balance (sitting and/or standing);Decreased knowledge of use of DME or AE;Pain      OT Treatment/Interventions: Self-care/ADL training;DME and/or AE instruction;Patient/family education;Balance training;Therapeutic activities    OT Goals(Current goals can be found in the care plan section) Acute Rehab OT Goals Patient Stated Goal: return home to my fur babies OT Goal Formulation: With patient Time For Goal Achievement: 05/07/20 Potential to Achieve Goals: Good ADL Goals Pt Will Perform Grooming: (P) with supervision;standing Pt Will Perform Lower Body Bathing: (P) with supervision;with adaptive equipment;sit to/from stand Pt Will Perform Lower Body Dressing: (P) with supervision;with adaptive equipment;sit to/from stand Pt Will Transfer to Toilet: (P) with supervision;ambulating Pt Will Perform Toileting - Clothing Manipulation and hygiene: (P) with supervision;sit to/from stand  OT Frequency: Min 2X/week   Barriers to D/C:            Co-evaluation              AM-PAC OT "6 Clicks" Daily Activity     Outcome Measure Help from another person eating meals?: None Help from another person taking care of personal grooming?: A Little Help from another person toileting, which includes using toliet, bedpan, or urinal?: A Little Help from another person bathing (including washing, rinsing, drying)?: A Lot Help from another person to put on and taking off regular upper body clothing?: None Help from another person to put on and taking off regular lower body clothing?: A Lot 6 Click Score: 18   End of  Session Equipment Utilized During Treatment: Gait belt;Rolling walker  Activity Tolerance: Patient limited by pain Patient left: in chair;with call bell/phone within reach  OT Visit Diagnosis: Unsteadiness on feet (R26.81);Other abnormalities of gait and mobility (R26.89);Pain;Muscle weakness (generalized) (M62.81)                Time: 4008-6761 OT Time Calculation (min): 31 min Charges:  OT General Charges $OT Visit: 1 Visit OT Evaluation $OT Eval Moderate Complexity: 1 Mod OT Treatments $Self Care/Home Management : 8-22 mins  Martie Round, OTR/L Acute Rehabilitation Services Pager: [phone] Office: [phone]  Evern Bio 04/23/2020, 2:12 PM

## 2020-04-24 ENCOUNTER — Encounter (HOSPITAL_COMMUNITY): Payer: Self-pay | Admitting: Vascular Surgery

## 2020-04-24 LAB — GLUCOSE, CAPILLARY
Glucose-Capillary: 166 mg/dL — ABNORMAL HIGH (ref 70–99)
Glucose-Capillary: 142 mg/dL — ABNORMAL HIGH (ref 70–99)
Glucose-Capillary: 111 mg/dL — ABNORMAL HIGH (ref 70–99)
Glucose-Capillary: 189 mg/dL — ABNORMAL HIGH (ref 70–99)

## 2020-04-24 MED ORDER — INDOMETHACIN 25 MG PO CAPS
25.0000 mg | ORAL_CAPSULE | Freq: Every day | ORAL | Status: DC | PRN
  Administered 2020-04-24: 25 mg via ORAL
  Filled 2020-04-24 (×2): qty 1

## 2020-04-24 NOTE — Progress Notes (Signed)
PHARMACIST LIPID MONITORING   Courtney Barker is a 64 y.o. female admitted on 04/22/2020 with PAD.  Pharmacy has been consulted to optimize lipid-lowering therapy with the indication of secondary prevention for clinical ASCVD.  Recent Labs:  Lipid Panel (last 6 months):   Lab Results  Component Value Date   CHOL 227 (H) 04/23/2020   TRIG 293 (H) 04/23/2020   HDL 29 (L) 04/23/2020   CHOLHDL 7.8 04/23/2020   VLDL 59 (H) 04/23/2020   LDLCALC 139 (H) 04/23/2020    Hepatic function panel (last 6 months):   Lab Results  Component Value Date   AST 24 04/22/2020   ALT 24 04/22/2020   ALKPHOS 62 04/22/2020   BILITOT 0.5 04/22/2020    SCr (since admission):   Serum creatinine: 1.02 mg/dL (H) 60/63/01 6010 Estimated creatinine clearance: 58.2 mL/min (A)  Current lipid-lowering therapy: none Previous lipid-lowering therapies (if applicable): ezetimibe, atorvastatin, simvastatin Documented or reported allergies or intolerances to lipid-lowering therapies (if applicable): ezetimibe, atorvastatin, simvastatin  Patient has been trialed on multiple statins and is documented as statin intolerant. Is already followed by the lipid clinic and they are discussing PCSK9i with her. I counseled patient on the benefits of lipid lowering therapy and encouraged her to follow up with her provider. She is not willing to initiate any lipid-lowering agents while inpatient.   Assessment:  Patient prefers no changes in lipid-lowering therapy at this time due to history of statin intolerance   Recommendation per protocol: Refer to lipid clinic for consideration of PCSK9i, Nexletol/Nexlizet, or clinical trial.  Follow-up with:  Lipid clinic  Plan: Follow up outpatient for initiation of PCSK9i  Lamar Sprinkles, PharmD PGY1 Pharmacy Resident 04/24/2020 8:15 AM

## 2020-04-24 NOTE — Progress Notes (Addendum)
Patients blood sugar level was 116, the provider and pharmacist was contacted in regards to holding or giving the scheduled dose of 105 units of Lantus.  The provider and pharmacist recommended asking the patient her home regimen because the plan is to model her at-home routine, related to insulin. The patient stated that she would only take half of the 105 units scheduled for the night if she had not ate enough, or if her blood sugar level was low. Pharmacy adjusted the Lantus dose to the patients suggestion of 52.5 units. Will continue to monitor.

## 2020-04-24 NOTE — Progress Notes (Addendum)
  Progress Note    04/24/2020 8:33 AM 2 Days Post-Op  Subjective:  Complaining about bilateral knee pain this morning and feeling "Swollen". Says she cannot tolerate standing   Vitals:   04/24/20 0407 04/24/20 0803  BP: (!) 124/56 108/60  Pulse: (!) 52 60  Resp: 16 18  Temp: 97.9 F (36.6 C) 98 F (36.7 C)  SpO2: 97% 95%   Physical Exam: Cardiac:  Regular rate and rhythm Lungs: non labored Incisions:  Right groin incision is clean, dry and intact. Dry gauze applied  Extremities:  Abdomen: obese Neurologic: alert and oriented  CBC    Component Value Date/Time   WBC 7.8 04/23/2020 0223   RBC 3.24 (L) 04/23/2020 0223   HGB 10.8 (L) 04/23/2020 0223   HGB 15.2 04/08/2019 1123   HCT 30.6 (L) 04/23/2020 0223   HCT 42.8 04/08/2019 1123   PLT 143 (L) 04/23/2020 0223   PLT 202 04/08/2019 1123   MCV 94.4 04/23/2020 0223   MCV 96 04/08/2019 1123   MCH 33.3 04/23/2020 0223   MCHC 35.3 04/23/2020 0223   RDW 12.1 04/23/2020 0223   RDW 12.7 04/08/2019 1123   LYMPHSABS 2.7 03/31/2018 1004   EOSABS 0.2 03/31/2018 1004   BASOSABS 0.1 03/31/2018 1004    BMET    Component Value Date/Time   NA 136 04/23/2020 0223   NA 135 04/08/2019 1123   K 3.7 04/23/2020 0223   CL 98 04/23/2020 0223   CO2 28 04/23/2020 0223   GLUCOSE 178 (H) 04/23/2020 0223   GLUCOSE 202 (H) 04/08/2006 1520   BUN 12 04/23/2020 0223   BUN 10 04/08/2019 1123   CREATININE 1.02 (H) 04/23/2020 0223   CREATININE 0.73 04/26/2016 1001   CALCIUM 8.0 (L) 04/23/2020 0223   GFRNONAA >60 04/23/2020 0223   GFRAA >60 01/11/2020 1605    INR    Component Value Date/Time   INR 1.0 04/22/2020 0706     Intake/Output Summary (Last 24 hours) at 04/24/2020 0833 Last data filed at 04/24/2020 0321 Gross per 24 hour  Intake --  Output 750 ml  Net -750 ml     Assessment/Plan:  64 y.o. female is s/p right common femoral endarterectomy 2 Days Post-Op. Lower extremity well perfused. Bilateral DP/ PT signals  bilaterally. She continues to have complaints of pain that is really unrelated to recent surgery. Continues to have chronic knee pains and joint pains. Have resumed patients home medications. Difficulty managing her pain due to allergies/ intolerances. Right groin incision is c/d/i continue to apply dry gauze to prevent wound breakdown. Urinating without difficulty. Encourage OOB and mobilize as tolerated. Likely not candidate for CIR. Probably home with [facility] services   DVT prophylaxis: sq. Heparin   Graceann Congress, PA-C [facility] [phone] 04/24/2020 8:33 AM   I have independently interviewed and examined patient and agree with PA assessment and plan above.   Tambi Thole C. Randie Heinz, MD [facility] [place] Office: [phone] Pager: [phone]

## 2020-04-25 ENCOUNTER — Inpatient Hospital Stay (HOSPITAL_COMMUNITY): Payer: BLUE CROSS/BLUE SHIELD

## 2020-04-25 DIAGNOSIS — Z9889 Other specified postprocedural states: Secondary | ICD-10-CM

## 2020-04-25 LAB — GLUCOSE, CAPILLARY
Glucose-Capillary: 181 mg/dL — ABNORMAL HIGH (ref 70–99)
Glucose-Capillary: 310 mg/dL — ABNORMAL HIGH (ref 70–99)
Glucose-Capillary: 103 mg/dL — ABNORMAL HIGH (ref 70–99)
Glucose-Capillary: 138 mg/dL — ABNORMAL HIGH (ref 70–99)

## 2020-04-25 MED ORDER — INDOMETHACIN 25 MG PO CAPS
50.0000 mg | ORAL_CAPSULE | Freq: Two times a day (BID) | ORAL | Status: DC
  Administered 2020-04-25 – 2020-04-26 (×3): 50 mg via ORAL
  Filled 2020-04-25 (×4): qty 2

## 2020-04-25 NOTE — Progress Notes (Signed)
[facility] [place]  Subjective  -states all her joints hurt.   Objective 123/60 (!) 51 97.8 F (36.6 C) (Oral) 19 99%  Intake/Output Summary (Last 24 hours) at 04/25/2020 0746 Last data filed at 04/24/2020 2100 Gross per 24 hour  Intake 480 ml  Output 1400 ml  Net -920 ml    Right groin clean dry and intact Right DP PT signals and states foot is motor sensory intact  Laboratory Lab Results: Recent Labs    04/23/20 0223  WBC 7.8  HGB 10.8*  HCT 30.6*  PLT 143*   BMET Recent Labs    04/23/20 0223  NA 136  K 3.7  CL 98  CO2 28  GLUCOSE 178*  BUN 12  CREATININE 1.02*  CALCIUM 8.0*    COAG Lab Results  Component Value Date   INR 1.0 04/22/2020   INR 1.0 01/11/2020   INR 0.99 08/22/2015   No results found for: PTT  Assessment/Planning: POD #3 status post right common femoral endarterectomy with profundoplasty.  Right groin looks okay.  Discussed keeping a dry gauze in the groin as will be high risk for groin complication from her obesity.  Has good Doppler signals in the right foot.  I think she is doing fine from surgery standpoint.  Complains of diffuse joint pain everywhere that I do not think is anything to do with her surgery.  She states these feel like gout flares and have ordered her indomethacin to be scheduled twice daily.  She states this has helped in the past.  PT had previously recommended CIR but does not appear she will qualify.  Hopefully discharge home tomorrow if we can work on her mobility.  Cephus Shelling 04/25/2020 7:46 AM --

## 2020-04-25 NOTE — [place] FL2 (Signed)
[place] MEDICAID FL2 LEVEL OF CARE SCREENING TOOL     IDENTIFICATION  Patient Name: Courtney Barker Birthdate: Oct 22, 1955 Sex: female Admission Date (Current Location): 04/22/2020  [facility] and [place] Number:   Ignacia Palma)   Facility and Address:  The [place], [address], [place], [place] [address]      Provider Number: [id number]  Attending Physician Name and Address:  Cephus Shelling, MD  Relative Name and Phone Number:  Darlina Guys, Barker, [phone]    Current Level of Care: Hospital Recommended Level of Care: Skilled Nursing Facility Prior Approval Number:    Date Approved/Denied:   PASRR Number: [id number]  Discharge Plan: SNF    Current Diagnoses: Patient Active Problem List   Diagnosis Date Noted  . PAD (peripheral artery disease) (HCC) 04/22/2020  . Peripheral arterial disease (HCC) 05/27/2018  . Obesity (BMI 36) 08/27/2015  . Abnormal nuclear cardiac imaging test 08/27/2015  . Multiple drug intolerance-> 20 08/27/2015  . Angina pectoris (HCC) 08/26/2015  . History of cardiac catheterization 03/30/2015  . Presence of coronary angioplasty implant and graft 03/30/2015  . Angina pectoris associated with type 2 diabetes mellitus (HCC) 02/22/2015  . Tobacco abuse 02/22/2015  . Palpitations 02/22/2015  . Generalized OA 04/07/2014  . CAD -S/P remote RCA PCI, now occluded s/p OM3 DES  08/26/15 04/28/2012  . Degeneration of intervertebral disc of cervical region 01/04/2012  . Cervical spondylosis with myelopathy 08/24/2011  . Chest pain 08/24/2011  . Coronary artery disease   . Compulsive tobacco user syndrome 01/10/2011  . Adult hypothyroidism 09/20/2010  . Type 2 diabetes mellitus (HCC) 09/05/2010  . Goiter, nontoxic, multinodular 05/31/2010  . Avitaminosis D 04/22/2009  . Arteriosclerosis of coronary artery 12/21/2008  . Asymptomatic varicose veins 11/30/2008  . Barrett esophagus 10/07/2008  . Degeneration of intervertebral disc  of lumbosacral region 04/15/2008  . Diverticular disease of large intestine 04/15/2008  . Body aches 12/26/2007  . Chronic nonalcoholic liver disease 08/05/2007  . Hemorrhoid 08/05/2007  . Carpal tunnel syndrome 06/27/2007  . Muscle ache 06/27/2007  . Generalized anxiety disorder 04/22/2007  . ACUTE BRONCHITIS 04/22/2007  . Back ache 04/10/2007  . ARTHRALGIA 02/19/2007  . CONSTIPATION, CHRONIC 02/14/2007  . MYALGIA 02/14/2007  . NEUROPATHY, IDIOPATHIC PERIPHERAL AUTONOMIC 01/24/2007  . DIABETES MELLITUS, TYPE II 12/09/2006  . Mixed hyperlipidemia 12/09/2006  . Essential hypertension 12/09/2006  . Coronary atherosclerosis 12/09/2006  . ALLERGIC RHINITIS 12/09/2006  . ASTHMA 12/09/2006  . GERD 12/09/2006  . DIVERTICULITIS, HX OF 12/09/2006    Orientation RESPIRATION BLADDER Height & Weight     Self, Time, Situation, Place  Normal Incontinent Weight: 205 lb 0.4 oz (93 kg) Height:  5' (152.4 cm)  BEHAVIORAL SYMPTOMS/MOOD NEUROLOGICAL BOWEL NUTRITION STATUS      Continent Diet (Please see DC Summary)  AMBULATORY STATUS COMMUNICATION OF NEEDS Skin   Extensive Assist Verbally Surgical wounds (Closed incision on groin)                       Personal Care Assistance Level of Assistance  Bathing, Feeding, Dressing Bathing Assistance: Limited assistance Feeding assistance: Independent Dressing Assistance: Limited assistance     Functional Limitations Info             SPECIAL CARE FACTORS FREQUENCY  PT (By licensed PT), OT (By licensed OT)     PT Frequency: 5x/week OT Frequency: 5x/week            Contractures Contractures Info: Not present  Additional Factors Info  Code Status, Allergies, Insulin Sliding Scale Code Status Info: Full Allergies Info: Candesartan Cilexetil, Clonidine Derivatives, Lisinopril, Losartan, Penicillins, Plavix (Clopidogrel Bisulfate), Quinapril Hcl, Repaglinide, Toprol Xl (Metoprolol Tartrate), Trovan (Alatrofloxacin), Atorvastatin,  Clarithromycin, Ezetimibe, Hydromorphone Hcl, Metoclopramide Hcl, Morphine And Related, Oxycodone-acetaminophen, Paroxetine Hcl, Pioglitazone, Valsartan, Vancomycin   Insulin Sliding Scale Info: See dc summary for dose       Current Medications (04/25/2020):  This is the current hospital active medication list Current Facility-Administered Medications  Medication Dose Route Frequency Provider Last Rate Last Admin  . 0.9 %  sodium chloride infusion  500 mL Intravenous Once PRN Clinton Gallant M, PA-C      . 0.9 %  sodium chloride infusion   Intravenous Continuous Lars Mage, PA-C 75 mL/hr at 04/22/20 1740 New Bag at 04/22/20 1740  . acetaminophen (TYLENOL) tablet 650 mg  650 mg Oral Q6H PRN Maeola Harman, MD   650 mg at 04/24/20 2140  . albuterol (PROVENTIL) (2.5 MG/3ML) 0.083% nebulizer solution 2.5 mg  2.5 mg Nebulization Q6H PRN Cephus Shelling, MD      . alum & mag hydroxide-simeth (MAALOX/MYLANTA) 200-200-20 MG/5ML suspension 15-30 mL  15-30 mL Oral Q2H PRN Lars Mage, PA-C   30 mL at 04/23/20 0039  . aspirin EC tablet 81 mg  81 mg Oral Q0600 Lars Mage, PA-C   81 mg at 04/25/20 4034  . bisacodyl (DULCOLAX) EC tablet 5 mg  5 mg Oral Daily PRN Lars Mage, PA-C      . bisoprolol-hydrochlorothiazide [facility]) 5-6.25 MG per tablet 2 tablet  2 tablet Oral BID AC & HS Lars Mage, PA-C   2 tablet at 04/25/20 7425  . clindamycin (CLEOCIN) IVPB 900 mg  900 mg Intravenous Q6H Clinton Gallant M, PA-C 100 mL/hr at 04/25/20 0522 900 mg at 04/25/20 0522  . diazepam (VALIUM) tablet 5 mg  5 mg Oral Daily PRN Lars Mage, PA-C   5 mg at 04/24/20 2141  . docusate sodium (COLACE) capsule 100 mg  100 mg Oral Daily Clinton Gallant M, PA-C   100 mg at 04/25/20 9563  . fluticasone (FLONASE) 50 MCG/ACT nasal spray 2 spray  2 spray Each Nare Daily PRN Lars Mage, PA-C      . furosemide (LASIX) tablet 40 mg  40 mg Oral QPM Clinton Gallant M, PA-C   40 mg at 04/24/20 1719  .  guaiFENesin-dextromethorphan (ROBITUSSIN DM) 100-10 MG/5ML syrup 15 mL  15 mL Oral Q4H PRN Clinton Gallant M, PA-C      . heparin injection 5,000 Units  5,000 Units Subcutaneous Q8H Clinton Gallant M, PA-C   5,000 Units at 04/25/20 1348  . hydrALAZINE (APRESOLINE) injection 5 mg  5 mg Intravenous Q20 Min PRN Clinton Gallant M, PA-C      . HYDROmorphone (DILAUDID) injection 0.5-1 mg  0.5-1 mg Intravenous Q2H PRN Clinton Gallant M, PA-C      . indomethacin (INDOCIN) capsule 50 mg  50 mg Oral BID WC Cephus Shelling, MD   50 mg at 04/25/20 0823  . insulin aspart (novoLOG) injection 55 Units  55 Units Subcutaneous Q breakfast Cephus Shelling, MD   30 Units at 04/24/20 0815  . insulin aspart (novoLOG) injection 70 Units  70 Units Subcutaneous BID AC Cephus Shelling, MD   50 Units at 04/25/20 1216  . insulin glargine (LANTUS) injection 105 Units  105 Units Subcutaneous QHS Lars Mage, [place]   105  Units at 04/24/20 2140  . magnesium oxide (MAG-OX) tablet 200 mg  200 mg Oral QPM Cephus Shelling, MD   200 mg at 04/24/20 1719  . magnesium sulfate IVPB 2 g 50 mL  2 g Intravenous Daily PRN Clinton Gallant M, PA-C      . metFORMIN (GLUCOPHAGE-XR) 24 hr tablet 500 mg  500 mg Oral Q breakfast Clinton Gallant M, PA-C   500 mg at 04/25/20 2831  . nitroGLYCERIN (NITROSTAT) SL tablet 0.4 mg  0.4 mg Sublingual Q5 Min x 3 PRN Clinton Gallant M, PA-C      . ondansetron [facility]) injection 4 mg  4 mg Intravenous Q6H PRN Clinton Gallant M, PA-C      . oxyCODONE (Oxy IR/ROXICODONE) immediate release tablet 5-10 mg  5-10 mg Oral Q4H PRN Clinton Gallant M, PA-C      . phenol (CHLORASEPTIC) mouth spray 1 spray  1 spray Mouth/Throat PRN Clinton Gallant M, PA-C      . polyethylene glycol (MIRALAX / GLYCOLAX) packet 17 g  17 g Oral Daily PRN Cephus Shelling, MD      . potassium chloride SA (KLOR-CON) CR tablet 20 mEq  20 mEq Oral QPM Clinton Gallant M, PA-C   10 mEq at 04/24/20 1721  . potassium chloride SA (KLOR-CON) CR  tablet 20-40 mEq  20-40 mEq Oral Daily PRN Clinton Gallant M, PA-C      . RABEprazole (ACIPHEX) EC tablet 20 mg  20 mg Oral QHS Cephus Shelling, MD   20 mg at 04/24/20 2206  . Simethicone CAPS 125 mg  125 mg Oral QID PRN Cephus Shelling, MD   125 mg at 04/23/20 2311  . Synthroid tablet 150 mcg [employer] Product  150 mcg Oral Once per day on Mon Tue Wed Thu Fri Sat Maeola Harman, MD   150 mcg at 04/25/20 0535   And  . Synthroid tablet 225 mcg [employer] Brand Product  225 mcg Oral Once per day on Sun Cain, Brandon Christopher, MD   225 mcg at 04/24/20 1027  . tamsulosin (FLOMAX) capsule 0.4 mg  0.4 mg Oral Daily Baglia, Corrina, PA-C   0.4 mg at 04/25/20 0823  . traMADol (ULTRAM) tablet 50 mg  50 mg Oral Q6H Maeola Harman, MD   50 mg at 04/25/20 1216  . Vitamin D (Ergocalciferol) (DRISDOL) capsule 50,000 Units  50,000 Units Oral Q Juliann Mule, [place]   50,000 Units at 04/24/20 5176     Discharge Medications: Please see discharge summary for a list of discharge medications.  Relevant Imaging Results:  Relevant Lab Results:   Additional Information SSN: [id number] [place], [place]

## 2020-04-25 NOTE — Progress Notes (Signed)
Physical Therapy Treatment Patient Details Name: Courtney Barker MRN: [id number] DOB: [birth year] Today's Date: 04/25/2020    History of Present Illness 64 yo female with PAD was admitted for R femoral endarterectomy with patch angioplasty, due to severely stenosed right common femoral artery with a short distal SFA occlusion and three-vessel runoff.  PMHx:  HLD, HTN, COPD, CAD, remote PCI, DM, obesity, anxiety, OA, asthma, cervical spondylosis and myelopathy, fibromyalgia, heart murmur, MI,     PT Comments    Pt supine in bed on arrival.  She doubts she can mobilize as she reports she has "gout-like pain all over."  Pt required significant assistance to rise into standing and to move from surface to surface.  Based on poor ability to tolerate CIR level therapies will update recommendations to SNF at this time.  Pt voiced needing B ankle braces for support, will inform MD of request.      Follow Up Recommendations  SNF;Supervision/Assistance - 24 hour     Equipment Recommendations  None recommended by PT    Recommendations for Other Services Rehab consult     Precautions / Restrictions Precautions Precautions: Fall Restrictions Weight Bearing Restrictions: No    Mobility  Bed Mobility Overal bed mobility: Needs Assistance Bed Mobility: Supine to Sit;Sit to Supine     Supine to sit: Min guard     General bed mobility comments: HOB up cues for safety  Transfers Overall transfer level: Needs assistance Equipment used: Rolling walker (2 wheeled);None   Sit to Stand: Mod assist Stand pivot transfers: Mod assist       General transfer comment: Pt performed sit to stand with mod assistance +1 to RW, quickly returns to seated surface to due pain in R knee and B ankles.  Require face to face transfer to move from bed to commode and back to bed.  Ambulation/Gait Ambulation/Gait assistance:  (Unable due to pain and weakness.  R knee buckling.)               Stairs              Wheelchair Mobility    Modified Rankin (Stroke Patients Only)       Balance Overall balance assessment: Needs assistance Sitting-balance support: Feet supported Sitting balance-Leahy Scale: Good       Standing balance-Leahy Scale: Poor                              Cognition Arousal/Alertness: Awake/alert Behavior During Therapy: WFL for tasks assessed/performed Overall Cognitive Status: Within Functional Limits for tasks assessed                                        Exercises      General Comments        Pertinent Vitals/Pain Pain Assessment: 0-10 Pain Score: 7  Pain Location: B ankle and R knee Pain Descriptors / Indicators: Aching;Grimacing;Guarding;Operative site guarding Pain Intervention(s): Monitored during session;Repositioned    Home Living                      Prior Function            PT Goals (current goals can now be found in the care plan section) Acute Rehab PT Goals Potential to Achieve Goals: Good    Frequency    Min  3X/week      PT Plan Current plan remains appropriate    Co-evaluation              AM-PAC PT "6 Clicks" Mobility   Outcome Measure  Help needed turning from your back to your side while in a flat bed without using bedrails?: A Little Help needed moving from lying on your back to sitting on the side of a flat bed without using bedrails?: A Little Help needed moving to and from a bed to a chair (including a wheelchair)?: A Lot Help needed standing up from a chair using your arms (e.g., wheelchair or bedside chair)?: A Lot Help needed to walk in hospital room?: A Lot Help needed climbing 3-5 steps with a railing? : Total 6 Click Score: 13    End of Session Equipment Utilized During Treatment: Gait belt Activity Tolerance: Patient limited by fatigue;Treatment limited secondary to medical complications (Comment);Patient limited by pain Patient left: in  bed;with call bell/phone within reach;with bed alarm set Nurse Communication: Mobility status PT Visit Diagnosis: Unsteadiness on feet (R26.81);Muscle weakness (generalized) (M62.81);Pain Pain - Right/Left: Right Pain - part of body: Knee;Leg     Time: 0254-2706 PT Time Calculation (min) (ACUTE ONLY): 16 min  Charges:  $Gait Training: 8-22 mins                     Courtney Barker , PTA Acute Rehabilitation Services Pager [phone] Office [phone]     Courtney Barker Artis Delay 04/25/2020, 1:30 PM

## 2020-04-25 NOTE — Progress Notes (Signed)
ABI has been completed.   Preliminary results in CV Proc.   Blanch Media 04/25/2020 2:43 PM

## 2020-04-25 NOTE — Progress Notes (Signed)
Pt is in tears and asking for the antibiotic to be stopped and remove the IV. IV is good and flushing well, but patient stated that the medicine is causing her pain. This RN will remove the IV and continue to monitor.

## 2020-04-26 LAB — GLUCOSE, CAPILLARY
Glucose-Capillary: 137 mg/dL — ABNORMAL HIGH (ref 70–99)
Glucose-Capillary: 300 mg/dL — ABNORMAL HIGH (ref 70–99)
Glucose-Capillary: 52 mg/dL — ABNORMAL LOW (ref 70–99)

## 2020-04-26 MED ORDER — INDOMETHACIN 25 MG PO CAPS: 25.0000 mg | ORAL_CAPSULE | Freq: Every day | ORAL | 0 refills | Status: DC | PRN

## 2020-04-26 MED ORDER — TRAMADOL HCL 50 MG PO TABS: 50.0000 mg | ORAL_TABLET | Freq: Four times a day (QID) | ORAL | 0 refills | Status: AC | PRN

## 2020-04-26 NOTE — Progress Notes (Addendum)
  Progress Note    04/26/2020 7:54 AM 4 Days Post-Op  Subjective:  Joint pain improved with indomethacin    Vitals:   04/25/20 2330 04/26/20 0540  BP: (!) 130/57 138/64  Pulse: (!) 46 (!) 51  Resp: 15 16  Temp: 98.2 F (36.8 C) 98.6 F (37 C)  SpO2: 100% 100%   Physical Exam: Lungs:  Non labored Incisions:  R groin c/d/i Extremities:  Feet symmetrically warm Neurologic: A&O  CBC    Component Value Date/Time   WBC 7.8 04/23/2020 0223   RBC 3.24 (L) 04/23/2020 0223   HGB 10.8 (L) 04/23/2020 0223   HGB 15.2 04/08/2019 1123   HCT 30.6 (L) 04/23/2020 0223   HCT 42.8 04/08/2019 1123   PLT 143 (L) 04/23/2020 0223   PLT 202 04/08/2019 1123   MCV 94.4 04/23/2020 0223   MCV 96 04/08/2019 1123   MCH 33.3 04/23/2020 0223   MCHC 35.3 04/23/2020 0223   RDW 12.1 04/23/2020 0223   RDW 12.7 04/08/2019 1123   LYMPHSABS 2.7 03/31/2018 1004   EOSABS 0.2 03/31/2018 1004   BASOSABS 0.1 03/31/2018 1004    BMET    Component Value Date/Time   NA 136 04/23/2020 0223   NA 135 04/08/2019 1123   K 3.7 04/23/2020 0223   CL 98 04/23/2020 0223   CO2 28 04/23/2020 0223   GLUCOSE 178 (H) 04/23/2020 0223   GLUCOSE 202 (H) 04/08/2006 1520   BUN 12 04/23/2020 0223   BUN 10 04/08/2019 1123   CREATININE 1.02 (H) 04/23/2020 0223   CREATININE 0.73 04/26/2016 1001   CALCIUM 8.0 (L) 04/23/2020 0223   GFRNONAA >60 04/23/2020 0223   GFRAA >60 01/11/2020 1605    INR    Component Value Date/Time   INR 1.0 04/22/2020 0706     Intake/Output Summary (Last 24 hours) at 04/26/2020 0754 Last data filed at 04/26/2020 0547 Gross per 24 hour  Intake 240 ml  Output 1200 ml  Net -960 ml     Assessment/Plan:  64 y.o. female is s/p R CFA endarterectomy 4 Days Post-Op   RLE well perfused PT/OT recommending SNF however patient wants to go home Follow up in 2-3 weeks with Dr. Chestine Spore D/c home this afternoon   Emilie Rutter, PA-C [facility] [phone] 04/26/2020 7:54  AM  I have seen and evaluated the patient. I agree with the PA note as documented above.  Postop day 4 status post right femoral endarterectomy with profundoplasty.  She still has good Doppler signals in the right DP and PT.  She is having no symptoms in the right foot.  Feels her symptoms are improved.  PT has recommended SNF but patient is refusing.  She feels she has support at home.  We will plan discharge home today and arrange follow-up in 2 to 3 weeks for groin check.  Given her obesity she will need meticulous wound care and have instructed her on keeping a clean dry 4 x 4 gauze over her groin incision change daily.  Cephus Shelling, MD [facility] [place] Office: [phone]

## 2020-04-26 NOTE — Progress Notes (Signed)
Physical Therapy Treatment Patient Details Name: Courtney Barker MRN: [id number] DOB: March 08, 1956 Today's Date: 04/26/2020    History of Present Illness 64 yo female with PAD was admitted for R femoral endarterectomy with patch angioplasty, due to severely stenosed right common femoral artery with a short distal SFA occlusion and three-vessel runoff.  PMHx:  HLD, HTN, COPD, CAD, remote PCI, DM, obesity, anxiety, OA, asthma, cervical spondylosis and myelopathy, fibromyalgia, heart murmur, MI,     PT Comments    Pt supine in bed on arrival this session.  Pt agreeable to PT session.  Pt making progress from yesterday's session and reports her limbs feel less swollen.  She continues to complain about the function of her L hand due to pain from infiltrated IV.  Pt continues to benefit from snf placement as she continues to require min to mod assistance.  Pt choosing to go home without any follow up and reports her son can carry her if needed.  Pt will need a WC or transport chair as she lacks functional gt to achieve mobility into and out of out patient appointments.  Pt is agreeable to this DME and case manager informed at this time.     Follow Up Recommendations  SNF;Supervision/Assistance - 24 hour (Pt refusing SNF and wants home.  She is also refusing HHPT in lieu of SNF and wants to return home with no PT follow up.)     Equipment Recommendations  Wheelchair (measurements PT);Wheelchair cushion (measurements PT) (Fixed leg rests.  Pt would really benefit from a transport chair if this is an option if not she will require the [facility] as she is unable to ambulate functionally.)    Recommendations for Other Services       Precautions / Restrictions Precautions Precautions: Fall Restrictions Weight Bearing Restrictions: No    Mobility  Bed Mobility Overal bed mobility: Modified Independent Bed Mobility: Supine to Sit           General bed mobility comments: Pt able to move to [facility]  unsupported.  Transfers Overall transfer level: Needs assistance Equipment used: Rolling walker (2 wheeled) Transfers: Sit to/from [employer] Sit to Stand: Min assist         General transfer comment: Min assistance from edge of bed into standing.  Pt continues to guard her L hand.  Ambulation/Gait Ambulation/Gait assistance: Mod assist;+2 safety/equipment Gait Distance (Feet): 4 Feet (+ 8 ft.) Assistive device: Rolling walker (2 wheeled) Gait Pattern/deviations: Step-to pattern;Wide base of support;Decreased weight shift to right;Trunk flexed Gait velocity: reduced   General Gait Details: Pt required assistance to elevate trunk and B hips into extension.  Pt required cues for safety as she attempts to let go of RW with L hand and flex over RW with support from her forearms when fatigued.   Stairs             Wheelchair Mobility    Modified Rankin (Stroke Patients Only)       Balance Overall balance assessment: Needs assistance Sitting-balance support: Feet supported Sitting balance-Leahy Scale: Good       Standing balance-Leahy Scale: Poor                              Cognition Arousal/Alertness: Awake/alert Behavior During Therapy: WFL for tasks assessed/performed Overall Cognitive Status: Within Functional Limits for tasks assessed  Exercises      General Comments        Pertinent Vitals/Pain Pain Assessment: 0-10 Pain Score: 7  Pain Location: B ankle and R knee and L hand ( IV infiltrated ) Pain Descriptors / Indicators: Aching;Grimacing;Guarding;Operative site guarding Pain Intervention(s): Monitored during session;Repositioned    Home Living                      Prior Function            PT Goals (current goals can now be found in the care plan section) Acute Rehab PT Goals Patient Stated Goal: home with family Potential to Achieve Goals:  Good Progress towards PT goals: Progressing toward goals    Frequency    Min 3X/week      PT Plan Current plan remains appropriate    Co-evaluation              AM-PAC PT "6 Clicks" Mobility   Outcome Measure  Help needed turning from your back to your side while in a flat bed without using bedrails?: None Help needed moving from lying on your back to sitting on the side of a flat bed without using bedrails?: None Help needed moving to and from a bed to a chair (including a wheelchair)?: A Little Help needed standing up from a chair using your arms (e.g., wheelchair or bedside chair)?: A Little Help needed to walk in hospital room?: A Lot Help needed climbing 3-5 steps with a railing? : Total 6 Click Score: 17    End of Session Equipment Utilized During Treatment: Gait belt Activity Tolerance: Patient limited by fatigue;Treatment limited secondary to medical complications (Comment);Patient limited by pain Patient left: in bed;with call bell/phone within reach;with bed alarm set Nurse Communication: Mobility status PT Visit Diagnosis: Unsteadiness on feet (R26.81);Muscle weakness (generalized) (M62.81);Pain Pain - Right/Left: Right Pain - part of body: Knee;Leg     Time: 0981-1914 PT Time Calculation (min) (ACUTE ONLY): 15 min  Charges:  $Gait Training: 8-22 mins                     Bonney Leitz , PTA Acute Rehabilitation Services Pager [phone] Office [phone]     Courtney Barker Delay 04/26/2020, 10:52 AM

## 2020-04-26 NOTE — Progress Notes (Signed)
Occupational Therapy Treatment Patient Details Name: Courtney Barker MRN: [id number] DOB: Jul 25, 1955 Today's Date: 04/26/2020    History of present illness 64 yo female with PAD was admitted for R femoral endarterectomy with patch angioplasty, due to severely stenosed right common femoral artery with a short distal SFA occlusion and three-vessel runoff.  PMHx:  HLD, HTN, COPD, CAD, remote PCI, DM, obesity, anxiety, OA, asthma, cervical spondylosis and myelopathy, fibromyalgia, heart murmur, MI,    OT comments  Patient continues to make progress towards goals in skilled OT session. Patient's session encompassed toilet transfers to [facility] as pt wanted to practice with her cane. Pt refusing to use walker, thinking it is making her more unstable, and asking therapist to bring the [facility] close to her, however kept having the therapist advance the [facility] forward (throughout, pt with severe kyphotic posture). With education, pt able to demonstrate upright posture with cane back to chair, but despite therapist's concerns, pt remains set on going home. Therapy will continue to follow.   Follow Up Recommendations  No OT follow up (Pt is refusing OT services at SNF or [facility])    Equipment Recommendations  None recommended by OT    Recommendations for Other Services      Precautions / Restrictions Precautions Precautions: Fall Restrictions Weight Bearing Restrictions: No       Mobility Bed Mobility Overal bed mobility: Modified Independent Bed Mobility: Supine to Sit           General bed mobility comments: In chair upon arrival  Transfers Overall transfer level: Needs assistance Equipment used: Straight cane (Refused walker and wanted to use her own cane) Transfers: Sit to/from [employer] Sit to Stand: Min assist         General transfer comment: Min A for safety and sequencing    Balance Overall balance assessment: Needs assistance Sitting-balance support: Feet  supported Sitting balance-Leahy Scale: Good     Standing balance support: Bilateral upper extremity supported;During functional activity Standing balance-Leahy Scale: Poor                             ADL either performed or assessed with clinical judgement   ADL Overall ADL's : Needs assistance/impaired                         Toilet Transfer: Minimal assistance;Ambulation;BSC Toilet Transfer Details (indicate cue type and reason): Ambulated with cane, severe kyphotic posture on inital ambulation to [facility], able to demonstrate improved body mechanics after education Toileting- Clothing Manipulation and Hygiene: Set up;Sit to/from stand       Functional mobility during ADLs: Minimal assistance;Cueing for sequencing;Cueing for safety;Cane General ADL Comments: Pt motivated to go home, refusing services at home or going to a SNF, able to state AE and DME at house, and receptive for cues to assist, however demonstrates safety risk in ambulation to complete toileting     Vision       Perception     Praxis      Cognition Arousal/Alertness: Awake/alert Behavior During Therapy: WFL for tasks assessed/performed Overall Cognitive Status: Within Functional Limits for tasks assessed                                          Exercises     Shoulder Instructions  General Comments      Pertinent Vitals/ Pain       Pain Assessment: No/denies pain Pain Score: 7  Pain Location: B ankle and R knee and L hand ( IV infiltrated ) Pain Descriptors / Indicators: Aching;Grimacing;Guarding;Operative site guarding Pain Intervention(s): Limited activity within patient's tolerance;Monitored during session;Repositioned  Home Living                                          Prior Functioning/Environment              Frequency  Min 2X/week        Progress Toward Goals  OT Goals(current goals can now be found in the care  plan section)  Progress towards OT goals: Progressing toward goals  Acute Rehab OT Goals Patient Stated Goal: home with family OT Goal Formulation: With patient Time For Goal Achievement: 05/07/20 Potential to Achieve Goals: Good  Plan Discharge plan remains appropriate (Pt refusing further services)    Co-evaluation                 AM-PAC OT "6 Clicks" Daily Activity     Outcome Measure   Help from another person eating meals?: None Help from another person taking care of personal grooming?: A Little Help from another person toileting, which includes using toliet, bedpan, or urinal?: A Lot Help from another person bathing (including washing, rinsing, drying)?: A Lot Help from another person to put on and taking off regular upper body clothing?: None Help from another person to put on and taking off regular lower body clothing?: A Lot 6 Click Score: 17    End of Session Equipment Utilized During Treatment: Gait belt;Other (comment) (Cane)  OT Visit Diagnosis: Unsteadiness on feet (R26.81);Other abnormalities of gait and mobility (R26.89);Pain;Muscle weakness (generalized) (M62.81)   Activity Tolerance Patient limited by fatigue   Patient Left in chair;with call bell/phone within reach   Nurse Communication Mobility status        Time: 1660-6301 OT Time Calculation (min): 30 min  Charges: OT General Charges $OT Visit: 1 Visit OT Treatments $Self Care/Home Management : 23-37 mins   Pollyann Glen E. Kemet Nijjar, COTA/L Acute Rehabilitation Services [phone]   Cherlyn Cushing 04/26/2020, 2:15 PM

## 2020-04-26 NOTE — TOC Transition Note (Signed)
Transition of Care (TOC) - CM/SW Discharge Note Donn Pierini RN, BSN Transitions of Care Unit 4E- RN Case Manager See Treatment Team for direct phone #    Patient Details  Name: Courtney Barker MRN: [id number] Date of Birth: March 05, 1956  Transition of Care [facility]) CM/SW Contact:  Darrold Span, RN Phone Number: 04/26/2020, 12:31 PM   Clinical Narrative:    Pt has refused rehab and [facility] services- per PT today- pt is agreeable to transport chair or w/c- PT note feels transport chair is best option if available- per [employer] they do not have any in house but can ship to pt's home. Order has been placed and [employer] will process to have shipped to pt's home.  Per pt son to come transport home later today.    Final next level of care: Home/Self Care Barriers to Discharge: No Barriers Identified   Patient Goals and CMS Choice Patient states their goals for this hospitalization and ongoing recovery are:: return home      Discharge Placement          Home             Discharge Plan and Services   Discharge Planning Services: CM Consult Post Acute Care Choice: Durable Medical Equipment          DME Arranged: Other see comment (transport chair) DME Agency: [employer] Date DME Agency Contacted: 04/26/20 Time DME Agency Contacted: 1115 Representative spoke with at DME Agency: Silvio Pate HH Arranged: Patient Refused HH, Refused SNF          Social Determinants of Health (SDOH) Interventions     Readmission Risk Interventions Readmission Risk Prevention Plan 04/26/2020  Post Dischage Appt Complete  Medication Screening Complete  Transportation Screening Complete  Some recent data might be hidden

## 2020-04-26 NOTE — Discharge Instructions (Signed)
 [facility]  Discharge instructions  Lower Extremity Bypass Surgery  Please refer to the following instruction for your post-procedure care. Your surgeon or physician assistant will discuss any changes with you.  Activity  You are encouraged to walk as much as you can. You can slowly return to normal activities during the month after your surgery. Avoid strenuous activity and heavy lifting until your doctor tells you it's OK. Avoid activities such as vacuuming or swinging a golf club. Do not drive until your doctor give the OK and you are no longer taking prescription pain medications. It is also normal to have difficulty with sleep habits, eating and bowel movement after surgery. These will go away with time.  Bathing/Showering  You may shower after you go home. Do not soak in a bathtub, hot tub, or swim until the incision heals completely.  Incision Care  Clean your incision with mild soap and water. Shower every day. Pat the area dry with a clean towel. You do not need a bandage unless otherwise instructed. Do not apply any ointments or creams to your incision. If you have open wounds you will be instructed how to care for them or a visiting nurse may be arranged for you. If you have staples or sutures along your incision they will be removed at your post-op appointment. You may have skin glue on your incision. Do not peel it off. It will come off on its own in about one week. If you have a great deal of moisture in your groin, use a gauze help keep this area dry.  Diet  Resume your normal diet. There are no special food restrictions following this procedure. A low fat/ low cholesterol diet is recommended for all patients with vascular disease. In order to heal from your surgery, it is CRITICAL to get adequate nutrition. Your body requires vitamins, minerals, and protein. Vegetables are the best source of vitamins and minerals. Vegetables also provide the  perfect balance of protein. Processed food has little nutritional value, so try to avoid this.  Medications  Resume taking all your medications unless your doctor or nurse practitioner tells you not to. If your incision is causing pain, you may take over-the-counter pain relievers such as acetaminophen (Tylenol). If you were prescribed a stronger pain medication, please aware these medication can cause nausea and constipation. Prevent nausea by taking the medication with a snack or meal. Avoid constipation by drinking plenty of fluids and eating foods with high amount of fiber, such as fruits, vegetables, and grains. Take Colase 100 mg (an over-the-counter stool softener) twice a day as needed for constipation. Do not take Tylenol if you are taking prescription pain medications.  Follow Up  Our office will schedule a follow up appointment 2-3 weeks following discharge.  Please call us immediately for any of the following conditions  Severe or worsening pain in your legs or feet while at rest or while walking Increase pain, redness, warmth, or drainage (pus) from your incision site(s) Fever of 101 degree or higher The swelling in your leg with the bypass suddenly worsens and becomes more painful than when you were in the hospital If you have been instructed to feel your graft pulse then you should do so every day. If you can no longer feel this pulse, call the office immediately. Not all patients are given this instruction.  Leg swelling is common after leg bypass surgery.  The swelling should improve over a few months   following surgery. To improve the swelling, you may elevate your legs above the level of your heart while you are sitting or resting. Your surgeon or physician assistant may ask you to apply an ACE wrap or wear compression (TED) stockings to help to reduce swelling.  Reduce your risk of vascular disease  Stop smoking. If you would like help call [facility] at 1-800-QUIT-NOW  (1-800-784-8669) or [place] at [phone].  Manage your cholesterol Maintain a desired weight Control your diabetes weight Control your diabetes Keep your blood pressure down  If you have any questions, please call the office at [phone]   

## 2020-04-28 NOTE — Discharge Summary (Signed)
Discharge Summary  Patient ID: Courtney Barker [id number] 64 y.o. August 27, 1955  Admit date: 04/22/2020  Discharge date and time: 04/26/2020  3:29 PM   Admitting Physician: Cephus Shelling, MD   Discharge Physician: Same  Admission Diagnoses: PAD (peripheral artery disease) [facility]) [I73.9]  Discharge Diagnoses: Same  Admission Condition: fair  Discharged Condition: fair  Indication for Admission: Right lower extremity short distance lifestyle limiting claudication  Hospital Course: Ms. Courtney Barker is a 64 year old female who was brought in as an outpatient for right lower extremity femoral endarterectomy with profundoplasty and bovine patch angioplasty by Dr. Chestine Spore on 04/22/2020 due to lifestyle limiting claudication.  She tolerated the procedure well and was admitted postoperatively.  Patient had difficulty with postoperative pain and not only surgical extremity however all other joints of her body.  This prolongated her hospital course.  At the time of discharge she had brisk Doppler signals and her right groin incision remained clean dry and intact.  She was prescribed indomethacin as a refill to her home medication.  She was also prescribed 2 to 3 days of tramadol for postoperative pain control.  She will follow-up in office with Dr. Chestine Spore in 2 to 3 weeks.  Discharge instructions including hygiene and general care for right groin incision were discussed with the patient and she verbalized her understanding.  She was discharged home in stable condition.  It should also be noted that she was recommended for skilled nursing facility which she declined.  Consults: None  Treatments: surgery: Right femoral endarterectomy with profundoplasty and bovine patch angioplasty by Dr. Chestine Spore on 04/22/2020  Discharge Exam: See progress note 04/26/2020 Vitals:   04/26/20 0900 04/26/20 1126  BP: 106/88 (!) 135/47  Pulse: 61 60  Resp: 20 15  Temp: 98.1 F (36.7 C) 97.6 F (36.4 C)  SpO2: 96% 98%      Disposition: Discharge disposition: 01-Home or Self Care       Patient Instructions:  Allergies as of 04/26/2020      Reactions   Candesartan Cilexetil Shortness Of Breath   Clonidine Derivatives Shortness Of Breath, Swelling   Lisinopril Shortness Of Breath, Swelling   Losartan Palpitations   Penicillins Anaphylaxis   Plavix [clopidogrel Bisulfate] Itching   Quinapril Hcl Shortness Of Breath, Swelling   Repaglinide Palpitations   Toprol Xl [metoprolol Tartrate] Shortness Of Breath, Swelling   Trovan [alatrofloxacin] Shortness Of Breath, Swelling   Atorvastatin Other (See Comments)   myalgias   Clarithromycin Nausea And Vomiting   Ezetimibe Other (See Comments)   GI bleeding   Hydromorphone Hcl Nausea And Vomiting   Metoclopramide Hcl Other (See Comments)   Reaction unknown   Morphine And Related Nausea And Vomiting   Oxycodone-acetaminophen Other (See Comments)   Body feels like bee stings   Paroxetine Hcl Other (See Comments)   Freezing, burning from inside out.   Pioglitazone Swelling, Other (See Comments)   Valsartan Palpitations   Vancomycin Other (See Comments)   Reaction unknown      Medication List    TAKE these medications   acetaminophen 500 MG tablet Commonly known as: TYLENOL Take 1,000 mg by mouth every 6 (six) hours as needed (pain).   Aciphex 20 MG tablet Generic drug: RABEprazole Take 20 mg by mouth every evening.   albuterol 108 (90 Base) MCG/ACT inhaler Commonly known as: VENTOLIN HFA Inhale 2 puffs into the lungs every 6 (six) hours as needed for wheezing or shortness of breath.   aspirin EC 81 MG tablet  Take 81 mg by mouth every evening. Swallow whole.   bisacodyl 5 MG EC tablet Commonly known as: DULCOLAX Take 5 mg by mouth daily as needed for mild constipation.   bisoprolol-hydrochlorothiazide 5-6.25 MG tablet Commonly known as: ZIAC Take 2 tablets by mouth in the morning and at bedtime.   DIABETIC TUSSIN EX 100 MG/5ML  liquid Generic drug: guaiFENesin Take 100-200 mg by mouth 3 (three) times daily as needed for cough.   diazepam 5 MG tablet Commonly known as: VALIUM Take 5 mg by mouth daily as needed for anxiety.   fexofenadine 60 MG tablet Commonly known as: ALLEGRA Take 60 mg by mouth See admin instructions. Take 1 tablet (60 mg) by mouth scheduled at night, may take an additional tablet in the morning if needed for allergies.   fluticasone 50 MCG/ACT nasal spray Commonly known as: FLONASE Place 2 sprays into the nose daily as needed for allergies.   furosemide 40 MG tablet Commonly known as: LASIX Take 40 mg by mouth every evening.   indomethacin 25 MG capsule Commonly known as: INDOCIN Take 1 capsule (25 mg total) by mouth daily as needed for mild pain.   Lantus 100 UNIT/ML injection Generic drug: insulin glargine Inject 105 Units into the skin at bedtime.   Magnesium 200 MG Tabs Take 200 mg by mouth every evening.   metFORMIN 500 MG 24 hr tablet Commonly known as: GLUCOPHAGE-XR Take 500 mg by mouth in the morning and at bedtime.   Neosporin + Pain Relief Max St 1 % Oint Generic drug: Neomy-Bacit-Polymyx-Pramoxine Apply 1 application topically 3 (three) times daily as needed (wound care).   nitroGLYCERIN 0.4 MG SL tablet Commonly known as: NITROSTAT DISSOLVE 1 TABLET UNDER THE TONGUE EVERY 5 MINUTES UP TO 3 DOSES What changed: See the new instructions.   NovoLOG 100 UNIT/ML injection Generic drug: insulin aspart Inject 0-70 Units into the skin as directed. Inject 55-70 units subcutaneously with meals on a sliding scale, inject 30-40 units subcutaneously with snacks on a sliding scale, 0-7 units subucutaneously with blood sugars over 300 (take that  Number & minus 150, & divide by 25 to equal units administered)   polyethylene glycol powder 17 GM/SCOOP powder Commonly known as: GLYCOLAX/MIRALAX Take 17 g by mouth daily as needed for constipation.   potassium chloride SA 20 MEQ  tablet Commonly known as: KLOR-CON Take 20 mEq by mouth every evening.   simethicone 125 MG chewable tablet Commonly known as: MYLICON Chew 125 mg by mouth every 6 (six) hours as needed for flatulence.   Synthroid 150 MCG tablet Generic drug: levothyroxine Take 150-225 mcg by mouth See admin instructions. Take 1.5 tablets (225 mcg) by mouth on Sundays, take 1 tablet (150 mcg) by mouth on all other days.   traMADol 50 MG tablet Commonly known as: ULTRAM Take 1 tablet (50 mg total) by mouth every 6 (six) hours as needed.   Vitamin D (Ergocalciferol) 1.25 MG (50000 UNIT) Caps capsule Commonly known as: DRISDOL Take 50,000 Units by mouth every Sunday.      Activity: activity as tolerated Diet: regular diet Wound Care: keep wound clean and dry  Follow-up with Dr. Chestine Spore in 2 weeks.  Signed: Emilie Rutter, PA-C 04/28/2020 3:52 PM [facility] Office: [phone]

## 2020-05-02 ENCOUNTER — Telehealth: Payer: Self-pay

## 2020-05-02 NOTE — Telephone Encounter (Signed)
Patient called to ask whether taking an antibiotic (a Zpack) she had at home would help because she read that it would help with a skin infection. I asked whether the doctor prescribed her an anti-biotic. She stated "the anti-biotic at the hospital didn't work out." I explained that taking an antibiotic just because would increase antibiotic resistance and it wasn't necessary to take. Patient became tearful and stated "just once can we ask the doctor, and not go through a nurse - he has seen it and knows and I wish I could explain it to you but I just can't." I explained that Chestine Spore was out of the office this week. Patient stated "I will just ask my druggist" and hung up the phone.

## 2020-05-17 ENCOUNTER — Ambulatory Visit (INDEPENDENT_AMBULATORY_CARE_PROVIDER_SITE_OTHER): Payer: Self-pay | Admitting: Vascular Surgery

## 2020-05-17 ENCOUNTER — Other Ambulatory Visit: Payer: Self-pay

## 2020-05-17 ENCOUNTER — Encounter: Payer: Self-pay | Admitting: Vascular Surgery

## 2020-05-17 VITALS — BP 149/73 | HR 51 | Temp 97.6°F | Resp 14 | Ht 60.0 in | Wt 193.0 lb

## 2020-05-17 DIAGNOSIS — I739 Peripheral vascular disease, unspecified: Secondary | ICD-10-CM

## 2020-05-17 MED ORDER — INDOMETHACIN 25 MG PO CAPS: 25.0000 mg | ORAL_CAPSULE | Freq: Every day | ORAL | 0 refills | Status: AC | PRN

## 2020-05-17 NOTE — Progress Notes (Signed)
Patient name: Courtney Barker MRN: [id number] DOB: 1956-04-02 Sex: female  REASON FOR VISIT: Postop check after right femoral endarterectomy and profundoplasty  HPI: Courtney Barker is a 64 y.o. female with multiple medical problems as listed below that underwent a right common femoral endarterectomy and profundoplasty with bovine patch on 04/22/2020 in the setting of short distance claudication that was lifestyle limiting.  Ultimately she had a fair number of issues postop including diffuse joint and extremity pain that seemed unrelated to her endarterectomy.  She was eventually discharged home and refused SNF.  She states her husband and her have been taking good care of the incision with Dial soap and a dry dressing.  She complains of some ongoing pain in the groin and into her mons but no significant right foot pain.  She is walking in her home but states she is having to steady herself.  Past Medical History:  Diagnosis Date  . Anxiety   . Arthritis    "head to toe; neck, hands, arms, knees, feet, ankles" (08/26/2015)  . Asthma   . Cervical spondylosis with myelopathy    s/p C5-6 and C6-7 Anterior cervical discectomy/decompression; C5-6 and C6-7 interbody arthrodesis with local morcellized autograft bone and Actifuse bone graft extender; insertion of interbody prosthesis at C5-6 and C6-7 (Zimmer peek interbody prosthesis); anterior cervical plating from C5-6 and C6-7 with globus titanium plate 5/0/35  . Chronic lower back pain   . Claustrophobia    extremely claustrophia   . COPD (chronic obstructive pulmonary disease) (HCC)   . Coronary artery disease    s/p BMS x3 RCA '02; repeat cath in '02 and '05 were without significant ISR and normal LV function; pt reported unremarkable nuclear myoview 2011/12  . Fibromyalgia   . GERD (gastroesophageal reflux disease)   . Headache(784.0)    "2-3 times/week" (08/26/2015)  . Heart murmur   . Hyperlipidemia    statin intolerant, to be followed by lipid  clinic  . Hypertension   . Hypothyroidism   . Increased urinary protein excretion   . Myocardial infarction [facility]) 12/2013   "[place]"  . Neuromuscular disorder (HCC)   . PONV (postoperative nausea and vomiting)   . Sleep apnea    NO STUDY DONE .[name].   . Thyroid nodule   . Tobacco abuse   . Type II diabetes mellitus (HCC)     Past Surgical History:  Procedure Laterality Date  . ABDOMINAL AORTOGRAM W/LOWER EXTREMITY N/A 11/25/2019   Procedure: ABDOMINAL AORTOGRAM W/LOWER EXTREMITY;  Surgeon: Iran Ouch, MD;  Location: [facility] INVASIVE CV LAB;  Service: Cardiovascular;  Laterality: N/A;  . ABDOMINAL HYSTERECTOMY  2000   "fibroids; took 1 ovary"  . ANTERIOR CERVICAL DECOMP/DISCECTOMY FUSION  08/23/2011   Procedure: ANTERIOR CERVICAL DECOMPRESSION/DISCECTOMY FUSION 2 LEVELS;  Surgeon: Cristi Loron, MD;  Location: [facility] NEURO ORS;  Service: Neurosurgery;  Laterality: N/A;  Anterior Cervical Five-Six/Six-Seven Decompression with Fusion with Interbody Prothesis, Plating, and Bonegraft  . BACK SURGERY    . BOWEL RESECTION  2007   . CARDIAC CATHETERIZATION N/A 08/26/2015   Procedure: Coronary Stent Intervention;  Surgeon: Peter M Swaziland, MD;  Location: [facility] INVASIVE CV LAB;  Service: Cardiovascular;  Laterality: N/A;  OM3 and circ  . CARDIAC CATHETERIZATION  2002; 2005   patent stents  . CARDIAC CATHETERIZATION  12/2013   CTO of the RCA ; moderate disease noted in the third OM and mild disease in the LAD; treated medically  . COLONOSCOPY W/ POLYPECTOMY    .  CORONARY ANGIOPLASTY WITH STENT PLACEMENT  2002   s/p BMS x3 RCA   . ENDARTERECTOMY FEMORAL Right 04/22/2020   Procedure: ENDARTERECTOMY FEMORAL RIGHT;  Surgeon: Cephus Shelling, MD;  Location: [facility] OR;  Service: Vascular;  Laterality: Right;  . MULTIPLE TOOTH EXTRACTIONS  2000  . PATCH ANGIOPLASTY Right 04/22/2020   Procedure: PATCH ANGIOPLASTY;  Surgeon: Cephus Shelling, MD;  Location: [facility] OR;  Service: Vascular;  Laterality: Right;   . TONSILLECTOMY    . TUBAL LIGATION  1981    Family History  Problem Relation Age of Onset  . Anesthesia problems Mother   . Anesthesia problems Sister   . Neuropathy Father   . Heart attack Father        Cause of death  . Neuropathy Sister   . Diabetes Sister   . Heart Problems Sister   . Diabetes Maternal Grandfather   . Lupus Sister     SOCIAL HISTORY: Social History   Tobacco Use  . Smoking status: Current Every Day Smoker    Packs/day: 1.00    Years: 43.00    Pack years: 43.00    Types: Cigarettes  . Smokeless tobacco: Never Used  Substance Use Topics  . Alcohol use: No    Alcohol/week: 0.0 standard drinks    Allergies  Allergen Reactions  . Candesartan Cilexetil Shortness Of Breath  . Clonidine Derivatives Shortness Of Breath and Swelling  . Lisinopril Shortness Of Breath and Swelling  . Losartan Palpitations  . Penicillins Anaphylaxis  . Plavix [Clopidogrel Bisulfate] Itching  . Quinapril Hcl Shortness Of Breath and Swelling  . Repaglinide Palpitations  . Toprol Xl [Metoprolol Tartrate] Shortness Of Breath and Swelling  . Trovan [Alatrofloxacin] Shortness Of Breath and Swelling  . Valsartan Palpitations and Other (See Comments)    Other reaction(s): Other (See Comments) Reaction unknown Pt unsure of reaction  . Atorvastatin Other (See Comments)    myalgias  . Clarithromycin Nausea And Vomiting  . Ezetimibe Other (See Comments)    GI bleeding Reaction unknown GI bleeding  . Hydromorphone Hcl Nausea And Vomiting  . Metoclopramide Hcl Other (See Comments)    Reaction unknown  . Morphine And Related Nausea And Vomiting  . Oxycodone-Acetaminophen Other (See Comments)    Body feels like bee stings  . Paroxetine Hcl Other (See Comments)    Freezing, burning from inside out.  . Pioglitazone Swelling and Other (See Comments)  . Vancomycin Other (See Comments)    Reaction unknown    Current Outpatient Medications  Medication Sig Dispense Refill  .  acetaminophen (TYLENOL) 500 MG tablet Take 1,000 mg by mouth every 6 (six) hours as needed (pain).     [name] albuterol (PROVENTIL HFA;VENTOLIN HFA) 108 (90 BASE) MCG/ACT inhaler Inhale 2 puffs into the lungs every 6 (six) hours as needed for wheezing or shortness of breath.    [name] aspirin EC 81 MG tablet Take 81 mg by mouth every evening. Swallow whole.    . bisacodyl (DULCOLAX) 5 MG EC tablet Take 5 mg by mouth daily as needed for mild constipation.     . bisoprolol-hydrochlorothiazide (ZIAC) 5-6.25 MG tablet Take 2 tablets by mouth in the morning and at bedtime.    . diazepam (VALIUM) 5 MG tablet Take 5 mg by mouth daily as needed for anxiety.    . fexofenadine (ALLEGRA) 60 MG tablet Take 60 mg by mouth See admin instructions. Take 1 tablet (60 mg) by mouth scheduled at night, may take an additional tablet  in the morning if needed for allergies.    . fluticasone (FLONASE) 50 MCG/ACT nasal spray Place 2 sprays into the nose daily as needed for allergies.     . furosemide (LASIX) 40 MG tablet Take 40 mg by mouth every evening.     [name] guaiFENesin (DIABETIC TUSSIN EX) 100 MG/5ML liquid Take 100-200 mg by mouth 3 (three) times daily as needed for cough.    . indomethacin (INDOCIN) 25 MG capsule Take 1 capsule (25 mg total) by mouth daily as needed for mild pain. 30 capsule 0  . LANTUS 100 UNIT/ML injection Inject 105 Units into the skin at bedtime.   5  . Magnesium 200 MG TABS Take 200 mg by mouth every evening.    . metFORMIN (GLUCOPHAGE-XR) 500 MG 24 hr tablet Take 500 mg by mouth in the morning and at bedtime.    [name] Neomy-Bacit-Polymyx-Pramoxine (NEOSPORIN + PAIN RELIEF MAX ST) 1 % OINT Apply 1 application topically 3 (three) times daily as needed (wound care).     . nitroGLYCERIN (NITROSTAT) 0.4 MG SL tablet DISSOLVE 1 TABLET UNDER THE TONGUE EVERY 5 MINUTES UP TO 3 DOSES (Patient taking differently: Place 0.4 mg under the tongue every 5 (five) minutes x 3 doses as needed for chest pain. ) 25 tablet 6  .  NOVOLOG 100 UNIT/ML injection Inject 0-70 Units into the skin as directed. Inject 55-70 units subcutaneously with meals on a sliding scale, inject 30-40 units subcutaneously with snacks on a sliding scale, 0-7 units subucutaneously with blood sugars over 300 (take that  Number & minus 150, & divide by 25 to equal units administered)  5  . polyethylene glycol powder (GLYCOLAX/MIRALAX) 17 GM/SCOOP powder Take 17 g by mouth daily as needed for constipation.    . potassium chloride SA (K-DUR,KLOR-CON) 20 MEQ tablet Take 20 mEq by mouth every evening.     . RABEprazole (ACIPHEX) 20 MG tablet Take 20 mg by mouth every evening.     . simethicone (MYLICON) 125 MG chewable tablet Chew 125 mg by mouth every 6 (six) hours as needed for flatulence.    [name] SYNTHROID 150 MCG tablet Take 150-225 mcg by mouth See admin instructions. Take 1.5 tablets (225 mcg) by mouth on Sundays, take 1 tablet (150 mcg) by mouth on all other days.  5  . Vitamin D, Ergocalciferol, (DRISDOL) 50000 UNITS CAPS Take 50,000 Units by mouth every Sunday.     . traMADol (ULTRAM) 50 MG tablet Take 1 tablet (50 mg total) by mouth every 6 (six) hours as needed. (Patient not taking: Reported on 05/17/2020) 20 tablet 0   No current facility-administered medications for this visit.    REVIEW OF SYSTEMS:  [X]  denotes positive finding, [ ]  denotes negative finding Cardiac  Comments:  Chest pain or chest pressure:    Shortness of breath upon exertion:    Short of breath when lying flat:    Irregular heart rhythm:        Vascular    Pain in calf, thigh, or hip brought on by ambulation:    Pain in feet at night that wakes you up from your sleep:     Blood clot in your veins:    Leg swelling:         Pulmonary    Oxygen at home:    Productive cough:     Wheezing:         Neurologic    Sudden weakness in arms or legs:  Sudden numbness in arms or legs:     Sudden onset of difficulty speaking or slurred speech:    Temporary loss of  vision in one eye:     Problems with dizziness:         Gastrointestinal    Blood in stool:     Vomited blood:         Genitourinary    Burning when urinating:     Blood in urine:        Psychiatric    Major depression:         Hematologic    Bleeding problems:    Problems with blood clotting too easily:        Skin    Rashes or ulcers:        Constitutional    Fever or chills:      PHYSICAL EXAM: Vitals:   05/17/20 1504  BP: (!) 149/73  Pulse: (!) 51  Resp: 14  Temp: 97.6 F (36.4 C)  TempSrc: Temporal  SpO2: 98%  Weight: 193 lb (87.5 kg)  Height: 5' (1.524 m)    GENERAL: The patient is a well-nourished female, in no acute distress. The vital signs are documented above. CARDIAC: There is a regular rate and rhythm.  VASCULAR:  Right femoral pulse palpable Groin incision pictured below Right DP and PT monophasic signals Right foot warm, no tissue loss       DATA:   None  Assessment/Plan:  64 year old female status post right femoral endarterectomy with profundoplasty and bovine patch on 04/22/2020 for short distance lifestyle limiting claudication.  Ultimately she does have a palpable femoral pulse on exam and monophasic pedal signals but she has a known SFA occlusion as well.  She had some superficial eschar in the incision that we debrided in clinic.  We will put her in a wet-to-dry dressing and have ordered home health to assist with wet-to-dry dressing changes.  Discussed she can wash this with Dial soap before applying wet to dry dressing and make sure to keep this nice and dry after washing.  I will see her again in 2 weeks for wound check. I had previously discussed with her high risk for groin complication and wound breakdown.    Cephus Shelling, MD [facility] Office: [phone]

## 2020-05-20 ENCOUNTER — Telehealth: Payer: Self-pay

## 2020-05-20 NOTE — Telephone Encounter (Signed)
Called pt to discuss HH needs and if she has used an agency in the past. The pt stated that she and her husband are doing the dressing changes exactly as directed by Dr. Chestine Spore and does not want a lot of people coming into her home. She stated she did not feel it was needed. I asked pt to explain to me the process of the dressing change to ensure it was being done correctly and pt was able to tell me how to do the dressing change correctly. I emphasized for her to call our office if any signs of infection/pain became present and if she had any additional questions. She also stated that she would talk with Dr. Chestine Spore about [facility] at her f/u visit and possible proceed with it if he feels it is necessary. Pt again reiterated that she feels as though she is doing fine. Pt verbalized understanding and agreed with this plan.

## 2020-05-31 ENCOUNTER — Other Ambulatory Visit: Payer: Self-pay

## 2020-05-31 ENCOUNTER — Encounter: Payer: Self-pay | Admitting: Vascular Surgery

## 2020-05-31 ENCOUNTER — Ambulatory Visit (INDEPENDENT_AMBULATORY_CARE_PROVIDER_SITE_OTHER): Payer: Self-pay | Admitting: Vascular Surgery

## 2020-05-31 VITALS — BP 144/60 | HR 51 | Temp 97.7°F | Resp 14 | Ht 60.0 in | Wt 189.0 lb

## 2020-05-31 DIAGNOSIS — I739 Peripheral vascular disease, unspecified: Secondary | ICD-10-CM

## 2020-05-31 NOTE — Progress Notes (Signed)
Patient name: Courtney Barker MRN: [id number] DOB: 1955/10/20 Sex: female  REASON FOR VISIT: Right groin wound check after right femoral endarterectomy and profundoplasty  HPI: Courtney Barker is a 64 y.o. female with multiple medical problems as listed below that underwent a right common femoral endarterectomy and profundoplasty with bovine patch on 04/22/2020 in the setting of short distance claudication that was lifestyle limiting.  Ultimately she had a fair number of issues postop including diffuse joint and extremity pain that seemed unrelated to her endarterectomy.  She was eventually discharged home and refused SNF.  Initial postop visit she had some fat necrosis and sloughing of the incision that appeared superficial.  This was debrided in clinic weeks ago.  We put in a wet-to-dry dressing with home health and asked her to wash with Dial soap.  Past Medical History:  Diagnosis Date  . Anxiety   . Arthritis    "head to toe; neck, hands, arms, knees, feet, ankles" (08/26/2015)  . Asthma   . Cervical spondylosis with myelopathy    s/p C5-6 and C6-7 Anterior cervical discectomy/decompression; C5-6 and C6-7 interbody arthrodesis with local morcellized autograft bone and Actifuse bone graft extender; insertion of interbody prosthesis at C5-6 and C6-7 (Zimmer peek interbody prosthesis); anterior cervical plating from C5-6 and C6-7 with globus titanium plate 07/23/07  . Chronic lower back pain   . Claustrophobia    extremely claustrophia   . COPD (chronic obstructive pulmonary disease) (HCC)   . Coronary artery disease    s/p BMS x3 RCA '02; repeat cath in '02 and '05 were without significant ISR and normal LV function; pt reported unremarkable nuclear myoview 2011/12  . Fibromyalgia   . GERD (gastroesophageal reflux disease)   . Headache(784.0)    "2-3 times/week" (08/26/2015)  . Heart murmur   . Hyperlipidemia    statin intolerant, to be followed by lipid clinic  . Hypertension   .  Hypothyroidism   . Increased urinary protein excretion   . Myocardial infarction [facility]) 12/2013   "[place]the"  . Neuromuscular disorder (HCC)   . PONV (postoperative nausea and vomiting)   . Sleep apnea    NO STUDY DONE .[name].   . Thyroid nodule   . Tobacco abuse   . Type II diabetes mellitus (HCC)     Past Surgical History:  Procedure Laterality Date  . ABDOMINAL AORTOGRAM W/LOWER EXTREMITY N/A 11/25/2019   Procedure: ABDOMINAL AORTOGRAM W/LOWER EXTREMITY;  Surgeon: Iran Ouch, MD;  Location: [facility] INVASIVE CV LAB;  Service: Cardiovascular;  Laterality: N/A;  . ABDOMINAL HYSTERECTOMY  2000   "fibroids; took 1 ovary"  . ANTERIOR CERVICAL DECOMP/DISCECTOMY FUSION  08/23/2011   Procedure: ANTERIOR CERVICAL DECOMPRESSION/DISCECTOMY FUSION 2 LEVELS;  Surgeon: Cristi Loron, MD;  Location: [facility] NEURO ORS;  Service: Neurosurgery;  Laterality: N/A;  Anterior Cervical Five-Six/Six-Seven Decompression with Fusion with Interbody Prothesis, Plating, and Bonegraft  . BACK SURGERY    . BOWEL RESECTION  2007   . CARDIAC CATHETERIZATION N/A 08/26/2015   Procedure: Coronary Stent Intervention;  Surgeon: Peter M Swaziland, MD;  Location: [facility] INVASIVE CV LAB;  Service: Cardiovascular;  Laterality: N/A;  OM3 and circ  . CARDIAC CATHETERIZATION  2002; 2005   patent stents  . CARDIAC CATHETERIZATION  12/2013   CTO of the RCA ; moderate disease noted in the third OM and mild disease in the LAD; treated medically  . COLONOSCOPY W/ POLYPECTOMY    . CORONARY ANGIOPLASTY WITH STENT PLACEMENT  2002  s/p BMS x3 RCA   . ENDARTERECTOMY FEMORAL Right 04/22/2020   Procedure: ENDARTERECTOMY FEMORAL RIGHT;  Surgeon: Cephus Shelling, MD;  Location: [facility] OR;  Service: Vascular;  Laterality: Right;  . MULTIPLE TOOTH EXTRACTIONS  2000  . PATCH ANGIOPLASTY Right 04/22/2020   Procedure: PATCH ANGIOPLASTY;  Surgeon: Cephus Shelling, MD;  Location: [facility] OR;  Service: Vascular;  Laterality: Right;  . TONSILLECTOMY    . TUBAL  LIGATION  1981    Family History  Problem Relation Age of Onset  . Anesthesia problems Mother   . Anesthesia problems Sister   . Neuropathy Father   . Heart attack Father        Cause of death  . Neuropathy Sister   . Diabetes Sister   . Heart Problems Sister   . Diabetes Maternal Grandfather   . Lupus Sister     SOCIAL HISTORY: Social History   Tobacco Use  . Smoking status: Current Every Day Smoker    Packs/day: 1.00    Years: 43.00    Pack years: 43.00    Types: Cigarettes  . Smokeless tobacco: Never Used  Substance Use Topics  . Alcohol use: No    Alcohol/week: 0.0 standard drinks    Allergies  Allergen Reactions  . Candesartan Cilexetil Shortness Of Breath  . Clonidine Derivatives Shortness Of Breath and Swelling  . Lisinopril Shortness Of Breath and Swelling  . Losartan Palpitations  . Penicillins Anaphylaxis  . Plavix [Clopidogrel Bisulfate] Itching  . Quinapril Hcl Shortness Of Breath and Swelling  . Repaglinide Palpitations  . Toprol Xl [Metoprolol Tartrate] Shortness Of Breath and Swelling  . Trovan [Alatrofloxacin] Shortness Of Breath and Swelling  . Valsartan Palpitations and Other (See Comments)    Other reaction(s): Other (See Comments) Reaction unknown Pt unsure of reaction  . Atorvastatin Other (See Comments)    myalgias  . Clarithromycin Nausea And Vomiting  . Ezetimibe Other (See Comments)    GI bleeding Reaction unknown GI bleeding  . Hydromorphone Hcl Nausea And Vomiting  . Metoclopramide Hcl Other (See Comments)    Reaction unknown  . Morphine And Related Nausea And Vomiting  . Oxycodone-Acetaminophen Other (See Comments)    Body feels like bee stings  . Paroxetine Hcl Other (See Comments)    Freezing, burning from inside out.  . Pioglitazone Swelling and Other (See Comments)  . Vancomycin Other (See Comments)    Reaction unknown    Current Outpatient Medications  Medication Sig Dispense Refill  . acetaminophen (TYLENOL) 500  MG tablet Take 1,000 mg by mouth every 6 (six) hours as needed (pain).     [name] albuterol (PROVENTIL HFA;VENTOLIN HFA) 108 (90 BASE) MCG/ACT inhaler Inhale 2 puffs into the lungs every 6 (six) hours as needed for wheezing or shortness of breath.    [name] aspirin EC 81 MG tablet Take 81 mg by mouth every evening. Swallow whole.    . bisacodyl (DULCOLAX) 5 MG EC tablet Take 5 mg by mouth daily as needed for mild constipation.     . bisoprolol-hydrochlorothiazide (ZIAC) 5-6.25 MG tablet Take 2 tablets by mouth in the morning and at bedtime.    . fexofenadine (ALLEGRA) 60 MG tablet Take 60 mg by mouth See admin instructions. Take 1 tablet (60 mg) by mouth scheduled at night, may take an additional tablet in the morning if needed for allergies.    . fluticasone (FLONASE) 50 MCG/ACT nasal spray Place 2 sprays into the nose daily as needed for allergies.     [name]  furosemide (LASIX) 40 MG tablet Take 40 mg by mouth every evening.     [name] guaiFENesin (ROBITUSSIN) 100 MG/5ML liquid Take 100-200 mg by mouth 3 (three) times daily as needed for cough.    . indomethacin (INDOCIN) 25 MG capsule Take 1 capsule (25 mg total) by mouth daily as needed for mild pain. (Patient taking differently: Take 25 mg by mouth 2 (two) times daily with a meal. Per patient dose change) 30 capsule 0  . LANTUS 100 UNIT/ML injection Inject 105 Units into the skin at bedtime.   5  . Magnesium 200 MG TABS Take 200 mg by mouth every evening.    . metFORMIN (GLUCOPHAGE-XR) 500 MG 24 hr tablet Take 500 mg by mouth in the morning and at bedtime.    [name] Neomy-Bacit-Polymyx-Pramoxine (NEOSPORIN + PAIN RELIEF MAX ST) 1 % OINT Apply 1 application topically 3 (three) times daily as needed (wound care).     . nitroGLYCERIN (NITROSTAT) 0.4 MG SL tablet DISSOLVE 1 TABLET UNDER THE TONGUE EVERY 5 MINUTES UP TO 3 DOSES (Patient taking differently: Place 0.4 mg under the tongue every 5 (five) minutes x 3 doses as needed for chest pain.) 25 tablet 6  . NOVOLOG 100  UNIT/ML injection Inject 0-70 Units into the skin as directed. Inject 55-70 units subcutaneously with meals on a sliding scale, inject 30-40 units subcutaneously with snacks on a sliding scale, 0-7 units subucutaneously with blood sugars over 300 (take that  Number & minus 150, & divide by 25 to equal units administered)  5  . polyethylene glycol powder (GLYCOLAX/MIRALAX) 17 GM/SCOOP powder Take 17 g by mouth daily as needed for constipation.    . potassium chloride SA (K-DUR,KLOR-CON) 20 MEQ tablet Take 20 mEq by mouth every evening.     . RABEprazole (ACIPHEX) 20 MG tablet Take 20 mg by mouth every evening.     . simethicone (MYLICON) 125 MG chewable tablet Chew 125 mg by mouth every 6 (six) hours as needed for flatulence.    [name] SYNTHROID 150 MCG tablet Take 150-225 mcg by mouth See admin instructions. Take 1.5 tablets (225 mcg) by mouth on Sundays, take 1 tablet (150 mcg) by mouth on all other days.  5  . traMADol (ULTRAM) 50 MG tablet Take 1 tablet (50 mg total) by mouth every 6 (six) hours as needed. 20 tablet 0  . Vitamin D, Ergocalciferol, (DRISDOL) 50000 UNITS CAPS Take 50,000 Units by mouth every Sunday.     . diazepam (VALIUM) 5 MG tablet Take 5 mg by mouth daily as needed for anxiety. (Patient not taking: Reported on 05/31/2020)     No current facility-administered medications for this visit.    REVIEW OF SYSTEMS:  [X]  denotes positive finding, [ ]  denotes negative finding Cardiac  Comments:  Chest pain or chest pressure:    Shortness of breath upon exertion:    Short of breath when lying flat:    Irregular heart rhythm:        Vascular    Pain in calf, thigh, or hip brought on by ambulation:    Pain in feet at night that wakes you up from your sleep:     Blood clot in your veins:    Leg swelling:         Pulmonary    Oxygen at home:    Productive cough:     Wheezing:         Neurologic    Sudden weakness in arms or legs:  Sudden numbness in arms or legs:     Sudden  onset of difficulty speaking or slurred speech:    Temporary loss of vision in one eye:     Problems with dizziness:         Gastrointestinal    Blood in stool:     Vomited blood:         Genitourinary    Burning when urinating:     Blood in urine:        Psychiatric    Major depression:         Hematologic    Bleeding problems:    Problems with blood clotting too easily:        Skin    Rashes or ulcers:        Constitutional    Fever or chills:      PHYSICAL EXAM: Vitals:   05/31/20 1543  BP: (!) 144/60  Pulse: (!) 51  Resp: 14  Temp: 97.7 F (36.5 C)  TempSrc: Temporal  SpO2: 99%  Weight: 189 lb (85.7 kg)  Height: 5' (1.524 m)    GENERAL: The patient is a well-nourished female, in no acute distress. The vital signs are documented above. CARDIAC: There is a regular rate and rhythm.  VASCULAR:  Right femoral pulse palpable Groin incision pictured below - improving Right DP and PT pretty brisk today Right foot warm, no tissue loss         DATA:   None  Assessment/Plan:  64 year old female status post right femoral endarterectomy with profundoplasty and bovine patch on 04/22/2020 for short distance lifestyle limiting claudication.  Ultimately she does have a palpable femoral pulse on exam and pretty brisk DP/PT signals today and has a known SFA occlusion as well.  The wound is superficial and improving with wound care.  Discussed continuing to wash the incision with Dial soap and  then put a wet-to-dry dressing.  I will see her in 3 weeks for wound check.  Cephus Shelling, MD [facility] Office: [phone]

## 2020-06-21 ENCOUNTER — Ambulatory Visit (INDEPENDENT_AMBULATORY_CARE_PROVIDER_SITE_OTHER): Payer: Self-pay | Admitting: Vascular Surgery

## 2020-06-21 ENCOUNTER — Other Ambulatory Visit: Payer: Self-pay

## 2020-06-21 ENCOUNTER — Encounter: Payer: Self-pay | Admitting: Vascular Surgery

## 2020-06-21 VITALS — BP 152/58 | HR 47 | Temp 98.0°F | Resp 20 | Wt 193.0 lb

## 2020-06-21 DIAGNOSIS — I739 Peripheral vascular disease, unspecified: Secondary | ICD-10-CM

## 2020-06-21 NOTE — Progress Notes (Signed)
Patient name: Courtney Barker MRN: [id number] DOB: [birth year] Sex: female  REASON FOR VISIT: Right groin wound check after right femoral endarterectomy and profundoplasty  HPI: Courtney Barker is a 65 y.o. female that presents for wound check after a right common femoral endarterectomy and profundoplasty with bovine patch on 04/22/2020 in the setting of short distance claudication that was lifestyle limiting.  Ultimately she had a fair number of issues postop including diffuse joint and extremity pain that seemed unrelated to her endarterectomy.  She was eventually discharged home and refused SNF.    Initial postop visit she had some fat necrosis and sloughing of the incision that appeared superficial.  This was debrided in clinic and we put her in a wet-to-dry dressing with home health and asked her to wash with Dial soap. The wound is now completely healed. She states she is now walking through the house and her right leg is much improved. Not having any significant left leg symptoms at this time with known left common femoral disease.  Past Medical History:  Diagnosis Date  . Anxiety   . Arthritis    "head to toe; neck, hands, arms, knees, feet, ankles" (08/26/2015)  . Asthma   . Cervical spondylosis with myelopathy    s/p C5-6 and C6-7 Anterior cervical discectomy/decompression; C5-6 and C6-7 interbody arthrodesis with local morcellized autograft bone and Actifuse bone graft extender; insertion of interbody prosthesis at C5-6 and C6-7 (Zimmer peek interbody prosthesis); anterior cervical plating from C5-6 and C6-7 with globus titanium plate 11/24/60  . Chronic lower back pain   . Claustrophobia    extremely claustrophia   . COPD (chronic obstructive pulmonary disease) (HCC)   . Coronary artery disease    s/p BMS x3 RCA '02; repeat cath in '02 and '05 were without significant ISR and normal LV function; pt reported unremarkable nuclear myoview 2011/12  . Fibromyalgia   . GERD (gastroesophageal  reflux disease)   . Headache(784.0)    "2-3 times/week" (08/26/2015)  . Heart murmur   . Hyperlipidemia    statin intolerant, to be followed by lipid clinic  . Hypertension   . Hypothyroidism   . Increased urinary protein excretion   . Myocardial infarction [facility]) 12/2013   "[place]"  . Neuromuscular disorder (HCC)   . PONV (postoperative nausea and vomiting)   . Sleep apnea    NO STUDY DONE .[name].   . Thyroid nodule   . Tobacco abuse   . Type II diabetes mellitus (HCC)     Past Surgical History:  Procedure Laterality Date  . ABDOMINAL AORTOGRAM W/LOWER EXTREMITY N/A 11/25/2019   Procedure: ABDOMINAL AORTOGRAM W/LOWER EXTREMITY;  Surgeon: Iran Ouch, MD;  Location: [facility] INVASIVE CV LAB;  Service: Cardiovascular;  Laterality: N/A;  . ABDOMINAL HYSTERECTOMY  2000   "fibroids; took 1 ovary"  . ANTERIOR CERVICAL DECOMP/DISCECTOMY FUSION  08/23/2011   Procedure: ANTERIOR CERVICAL DECOMPRESSION/DISCECTOMY FUSION 2 LEVELS;  Surgeon: Cristi Loron, MD;  Location: [facility] NEURO ORS;  Service: Neurosurgery;  Laterality: N/A;  Anterior Cervical Five-Six/Six-Seven Decompression with Fusion with Interbody Prothesis, Plating, and Bonegraft  . BACK SURGERY    . BOWEL RESECTION  2007   . CARDIAC CATHETERIZATION N/A 08/26/2015   Procedure: Coronary Stent Intervention;  Surgeon: Peter M Swaziland, MD;  Location: [facility] INVASIVE CV LAB;  Service: Cardiovascular;  Laterality: N/A;  OM3 and circ  . CARDIAC CATHETERIZATION  2002; 2005   patent stents  . CARDIAC CATHETERIZATION  12/2013   CTO of  the RCA ; moderate disease noted in the third OM and mild disease in the LAD; treated medically  . COLONOSCOPY W/ POLYPECTOMY    . CORONARY ANGIOPLASTY WITH STENT PLACEMENT  2002   s/p BMS x3 RCA   . ENDARTERECTOMY FEMORAL Right 04/22/2020   Procedure: ENDARTERECTOMY FEMORAL RIGHT;  Surgeon: Cephus Shelling, MD;  Location: [facility] OR;  Service: Vascular;  Laterality: Right;  . MULTIPLE TOOTH EXTRACTIONS  2000  . PATCH  ANGIOPLASTY Right 04/22/2020   Procedure: PATCH ANGIOPLASTY;  Surgeon: Cephus Shelling, MD;  Location: [facility] OR;  Service: Vascular;  Laterality: Right;  . TONSILLECTOMY    . TUBAL LIGATION  1981    Family History  Problem Relation Age of Onset  . Anesthesia problems Mother   . Anesthesia problems Sister   . Neuropathy Father   . Heart attack Father        Cause of death  . Neuropathy Sister   . Diabetes Sister   . Heart Problems Sister   . Diabetes Maternal Grandfather   . Lupus Sister     SOCIAL HISTORY: Social History   Tobacco Use  . Smoking status: Current Every Day Smoker    Packs/day: 1.00    Years: 43.00    Pack years: 43.00    Types: Cigarettes  . Smokeless tobacco: Never Used  Substance Use Topics  . Alcohol use: No    Alcohol/week: 0.0 standard drinks    Allergies  Allergen Reactions  . Candesartan Cilexetil Shortness Of Breath  . Clonidine Derivatives Shortness Of Breath and Swelling  . Lisinopril Shortness Of Breath and Swelling  . Losartan Palpitations  . Penicillins Anaphylaxis  . Plavix [Clopidogrel Bisulfate] Itching  . Quinapril Hcl Shortness Of Breath and Swelling  . Repaglinide Palpitations  . Toprol Xl [Metoprolol Tartrate] Shortness Of Breath and Swelling  . Trovan [Alatrofloxacin] Shortness Of Breath and Swelling  . Valsartan Palpitations and Other (See Comments)    Other reaction(s): Other (See Comments) Reaction unknown Pt unsure of reaction  . Atorvastatin Other (See Comments)    myalgias  . Clarithromycin Nausea And Vomiting  . Ezetimibe Other (See Comments)    GI bleeding Reaction unknown GI bleeding  . Hydromorphone Hcl Nausea And Vomiting  . Metoclopramide Hcl Other (See Comments)    Reaction unknown  . Morphine And Related Nausea And Vomiting  . Oxycodone-Acetaminophen Other (See Comments)    Body feels like bee stings  . Paroxetine Hcl Other (See Comments)    Freezing, burning from inside out.  . Pioglitazone Swelling  and Other (See Comments)  . Vancomycin Other (See Comments)    Reaction unknown    Current Outpatient Medications  Medication Sig Dispense Refill  . acetaminophen (TYLENOL) 500 MG tablet Take 1,000 mg by mouth every 6 (six) hours as needed (pain).     [name] albuterol (PROVENTIL HFA;VENTOLIN HFA) 108 (90 BASE) MCG/ACT inhaler Inhale 2 puffs into the lungs every 6 (six) hours as needed for wheezing or shortness of breath.    [name] aspirin EC 81 MG tablet Take 81 mg by mouth every evening. Swallow whole.    . bisacodyl (DULCOLAX) 5 MG EC tablet Take 5 mg by mouth daily as needed for mild constipation.     . bisoprolol-hydrochlorothiazide (ZIAC) 5-6.25 MG tablet Take 2 tablets by mouth in the morning and at bedtime.    . diazepam (VALIUM) 5 MG tablet Take 5 mg by mouth daily as needed for anxiety.    . fexofenadine (  ALLEGRA) 60 MG tablet Take 60 mg by mouth See admin instructions. Take 1 tablet (60 mg) by mouth scheduled at night, may take an additional tablet in the morning if needed for allergies.    . fluticasone (FLONASE) 50 MCG/ACT nasal spray Place 2 sprays into the nose daily as needed for allergies.     . furosemide (LASIX) 40 MG tablet Take 40 mg by mouth every evening.     [name] glucose blood (CONTOUR NEXT TEST) test strip USE TO CHECK BLOOD SUGAR FIVE TIMES DAILY    . guaiFENesin (ROBITUSSIN) 100 MG/5ML liquid Take 100-200 mg by mouth 3 (three) times daily as needed for cough.    . indomethacin (INDOCIN) 25 MG capsule Take 1 capsule (25 mg total) by mouth daily as needed for mild pain. (Patient taking differently: Take 25 mg by mouth 2 (two) times daily with a meal. Per patient dose change) 30 capsule 0  . LANTUS 100 UNIT/ML injection Inject 105 Units into the skin at bedtime.   5  . Magnesium 200 MG TABS Take 200 mg by mouth every evening.    . metFORMIN (GLUCOPHAGE-XR) 500 MG 24 hr tablet Take 500 mg by mouth in the morning and at bedtime.    [name] Neomy-Bacit-Polymyx-Pramoxine (NEOSPORIN + PAIN  RELIEF MAX ST) 1 % OINT Apply 1 application topically 3 (three) times daily as needed (wound care).     . nitroGLYCERIN (NITROSTAT) 0.4 MG SL tablet DISSOLVE 1 TABLET UNDER THE TONGUE EVERY 5 MINUTES UP TO 3 DOSES (Patient taking differently: Place 0.4 mg under the tongue every 5 (five) minutes x 3 doses as needed for chest pain.) 25 tablet 6  . NOVOLOG 100 UNIT/ML injection Inject 0-70 Units into the skin as directed. Inject 55-70 units subcutaneously with meals on a sliding scale, inject 30-40 units subcutaneously with snacks on a sliding scale, 0-7 units subucutaneously with blood sugars over 300 (take that  Number & minus 150, & divide by 25 to equal units administered)  5  . polyethylene glycol powder (GLYCOLAX/MIRALAX) 17 GM/SCOOP powder Take 17 g by mouth daily as needed for constipation.    . potassium chloride SA (K-DUR,KLOR-CON) 20 MEQ tablet Take 20 mEq by mouth every evening.     . RABEprazole (ACIPHEX) 20 MG tablet Take 20 mg by mouth every evening.     . simethicone (MYLICON) 528 MG chewable tablet Chew 125 mg by mouth every 6 (six) hours as needed for flatulence.    [name] SYNTHROID 150 MCG tablet Take 150-225 mcg by mouth See admin instructions. Take 1.5 tablets (225 mcg) by mouth on Sundays, take 1 tablet (150 mcg) by mouth on all other days.  5  . traMADol (ULTRAM) 50 MG tablet Take 1 tablet (50 mg total) by mouth every 6 (six) hours as needed. 20 tablet 0  . Vitamin D, Ergocalciferol, (DRISDOL) 50000 UNITS CAPS Take 50,000 Units by mouth every Sunday.     . CONTOUR NEXT TEST test strip      No current facility-administered medications for this visit.    REVIEW OF SYSTEMS:  [X]  denotes positive finding, [ ]  denotes negative finding Cardiac  Comments:  Chest pain or chest pressure:    Shortness of breath upon exertion:    Short of breath when lying flat:    Irregular heart rhythm:        Vascular    Pain in calf, thigh, or hip brought on by ambulation:    Pain in feet at night  that wakes you up from your sleep:     Blood clot in your veins:    Leg swelling:         Pulmonary    Oxygen at home:    Productive cough:     Wheezing:         Neurologic    Sudden weakness in arms or legs:     Sudden numbness in arms or legs:     Sudden onset of difficulty speaking or slurred speech:    Temporary loss of vision in one eye:     Problems with dizziness:         Gastrointestinal    Blood in stool:     Vomited blood:         Genitourinary    Burning when urinating:     Blood in urine:        Psychiatric    Major depression:         Hematologic    Bleeding problems:    Problems with blood clotting too easily:        Skin    Rashes or ulcers:        Constitutional    Fever or chills:      PHYSICAL EXAM: Vitals:   06/21/20 1620  BP: (!) 152/58  Pulse: (!) 47  Resp: 20  Temp: 98 F (36.7 C)  TempSrc: Temporal  SpO2: 97%  Weight: 193 lb (87.5 kg)    GENERAL: The patient is a well-nourished female, in no acute distress. The vital signs are documented above. CARDIAC: There is a regular rate and rhythm.  VASCULAR:  Right femoral pulse palpable Groin incision pictured below - healed Right DP and PT pretty brisk today Right foot warm, no tissue loss           DATA:   None  Assessment/Plan:  65 year old female status post right femoral endarterectomy with profundoplasty and bovine patch on 04/22/2020 for short distance lifestyle limiting claudication.  Ultimately she does have a palpable femoral pulse on exam and pretty brisk DP/PT signals today and has a known SFA occlusion as well. The wound is now healed with excellent wound care. I will see her in 6 months with ABIs for ongoing surveillance. She does not want anything done with her left leg at this time given she is not really endorsing any significant left leg symptoms and has known significant common femoral disease on the left as well. I will also get a carotid duplex at her request  in 6 months which seems appropriate with her tobacco history.  Cephus Shelling, MD [facility] Office: [phone]

## 2020-06-22 ENCOUNTER — Other Ambulatory Visit: Payer: Self-pay

## 2020-06-22 DIAGNOSIS — I739 Peripheral vascular disease, unspecified: Secondary | ICD-10-CM

## 2020-08-10 NOTE — Telephone Encounter (Signed)
Error
